# Patient Record
Sex: Female | Born: 1970 | Race: White | Hispanic: No | Marital: Single | Smoking: Current every day smoker
Health system: Southern US, Community
[De-identification: ages and names within clinical notes are randomized; demographics above are authoritative.]

## PROBLEM LIST (undated history)

## (undated) ENCOUNTER — Emergency Department (HOSPITAL_COMMUNITY): Payer: Medicaid Other

## (undated) ENCOUNTER — Emergency Department (HOSPITAL_COMMUNITY): Admission: EM | Payer: Medicaid Other | Source: Home / Self Care

## (undated) DIAGNOSIS — Z9151 Personal history of suicidal behavior: Secondary | ICD-10-CM

## (undated) DIAGNOSIS — E7800 Pure hypercholesterolemia, unspecified: Secondary | ICD-10-CM

## (undated) DIAGNOSIS — F329 Major depressive disorder, single episode, unspecified: Secondary | ICD-10-CM

## (undated) DIAGNOSIS — Z765 Malingerer [conscious simulation]: Secondary | ICD-10-CM

## (undated) DIAGNOSIS — F1910 Other psychoactive substance abuse, uncomplicated: Secondary | ICD-10-CM

## (undated) DIAGNOSIS — K769 Liver disease, unspecified: Secondary | ICD-10-CM

## (undated) DIAGNOSIS — Z915 Personal history of self-harm: Secondary | ICD-10-CM

## (undated) DIAGNOSIS — E119 Type 2 diabetes mellitus without complications: Secondary | ICD-10-CM

## (undated) DIAGNOSIS — I10 Essential (primary) hypertension: Secondary | ICD-10-CM

## (undated) DIAGNOSIS — M549 Dorsalgia, unspecified: Secondary | ICD-10-CM

## (undated) DIAGNOSIS — F419 Anxiety disorder, unspecified: Secondary | ICD-10-CM

## (undated) DIAGNOSIS — F32A Depression, unspecified: Secondary | ICD-10-CM

## (undated) DIAGNOSIS — F1020 Alcohol dependence, uncomplicated: Secondary | ICD-10-CM

## (undated) DIAGNOSIS — F319 Bipolar disorder, unspecified: Secondary | ICD-10-CM

## (undated) HISTORY — PX: BACK SURGERY: SHX140

---

## 1997-07-25 ENCOUNTER — Emergency Department (HOSPITAL_COMMUNITY): Admission: EM | Admit: 1997-07-25 | Discharge: 1997-07-25 | Payer: Self-pay | Admitting: Family Medicine

## 1998-03-23 ENCOUNTER — Emergency Department (HOSPITAL_COMMUNITY): Admission: EM | Admit: 1998-03-23 | Discharge: 1998-03-23 | Payer: Self-pay | Admitting: Emergency Medicine

## 1998-10-13 ENCOUNTER — Emergency Department (HOSPITAL_COMMUNITY): Admission: EM | Admit: 1998-10-13 | Discharge: 1998-10-13 | Payer: Self-pay | Admitting: *Deleted

## 1999-01-17 ENCOUNTER — Emergency Department (HOSPITAL_COMMUNITY): Admission: EM | Admit: 1999-01-17 | Discharge: 1999-01-17 | Payer: Self-pay | Admitting: Emergency Medicine

## 1999-01-17 ENCOUNTER — Encounter: Payer: Self-pay | Admitting: Emergency Medicine

## 2000-07-17 ENCOUNTER — Emergency Department (HOSPITAL_COMMUNITY): Admission: EM | Admit: 2000-07-17 | Discharge: 2000-07-17 | Payer: Self-pay | Admitting: *Deleted

## 2000-07-18 ENCOUNTER — Emergency Department (HOSPITAL_COMMUNITY): Admission: EM | Admit: 2000-07-18 | Discharge: 2000-07-18 | Payer: Self-pay | Admitting: Emergency Medicine

## 2000-07-19 ENCOUNTER — Emergency Department (HOSPITAL_COMMUNITY): Admission: EM | Admit: 2000-07-19 | Discharge: 2000-07-19 | Payer: Self-pay | Admitting: Emergency Medicine

## 2000-10-17 ENCOUNTER — Emergency Department (HOSPITAL_COMMUNITY): Admission: EM | Admit: 2000-10-17 | Discharge: 2000-10-17 | Payer: Self-pay | Admitting: *Deleted

## 2000-12-26 ENCOUNTER — Encounter: Payer: Self-pay | Admitting: *Deleted

## 2000-12-26 ENCOUNTER — Emergency Department (HOSPITAL_COMMUNITY): Admission: EM | Admit: 2000-12-26 | Discharge: 2000-12-26 | Payer: Self-pay | Admitting: *Deleted

## 2000-12-27 ENCOUNTER — Encounter: Payer: Self-pay | Admitting: Emergency Medicine

## 2000-12-27 ENCOUNTER — Emergency Department (HOSPITAL_COMMUNITY): Admission: EM | Admit: 2000-12-27 | Discharge: 2000-12-27 | Payer: Self-pay | Admitting: Emergency Medicine

## 2001-01-03 ENCOUNTER — Emergency Department (HOSPITAL_COMMUNITY): Admission: EM | Admit: 2001-01-03 | Discharge: 2001-01-03 | Payer: Self-pay | Admitting: *Deleted

## 2001-01-29 ENCOUNTER — Emergency Department (HOSPITAL_COMMUNITY): Admission: EM | Admit: 2001-01-29 | Discharge: 2001-01-29 | Payer: Self-pay | Admitting: Emergency Medicine

## 2001-03-05 ENCOUNTER — Emergency Department (HOSPITAL_COMMUNITY): Admission: EM | Admit: 2001-03-05 | Discharge: 2001-03-05 | Payer: Self-pay | Admitting: *Deleted

## 2001-04-07 ENCOUNTER — Emergency Department (HOSPITAL_COMMUNITY): Admission: EM | Admit: 2001-04-07 | Discharge: 2001-04-07 | Payer: Self-pay | Admitting: Emergency Medicine

## 2001-04-07 ENCOUNTER — Emergency Department (HOSPITAL_COMMUNITY): Admission: EM | Admit: 2001-04-07 | Discharge: 2001-04-07 | Payer: Self-pay | Admitting: *Deleted

## 2001-04-09 ENCOUNTER — Emergency Department (HOSPITAL_COMMUNITY): Admission: EM | Admit: 2001-04-09 | Discharge: 2001-04-09 | Payer: Self-pay | Admitting: Emergency Medicine

## 2001-04-10 ENCOUNTER — Emergency Department (HOSPITAL_COMMUNITY): Admission: EM | Admit: 2001-04-10 | Discharge: 2001-04-10 | Payer: Self-pay | Admitting: Emergency Medicine

## 2001-04-11 ENCOUNTER — Encounter (HOSPITAL_COMMUNITY): Admission: RE | Admit: 2001-04-11 | Discharge: 2001-05-11 | Payer: Self-pay | Admitting: Emergency Medicine

## 2001-04-16 ENCOUNTER — Emergency Department (HOSPITAL_COMMUNITY): Admission: EM | Admit: 2001-04-16 | Discharge: 2001-04-16 | Payer: Self-pay | Admitting: *Deleted

## 2001-06-08 ENCOUNTER — Emergency Department (HOSPITAL_COMMUNITY): Admission: EM | Admit: 2001-06-08 | Discharge: 2001-06-08 | Payer: Self-pay | Admitting: Internal Medicine

## 2001-06-26 ENCOUNTER — Emergency Department (HOSPITAL_COMMUNITY): Admission: EM | Admit: 2001-06-26 | Discharge: 2001-06-26 | Payer: Self-pay | Admitting: *Deleted

## 2001-06-26 ENCOUNTER — Encounter: Payer: Self-pay | Admitting: *Deleted

## 2001-06-27 ENCOUNTER — Emergency Department (HOSPITAL_COMMUNITY): Admission: EM | Admit: 2001-06-27 | Discharge: 2001-06-27 | Payer: Self-pay | Admitting: *Deleted

## 2001-06-28 ENCOUNTER — Encounter: Payer: Self-pay | Admitting: Emergency Medicine

## 2001-06-28 ENCOUNTER — Emergency Department (HOSPITAL_COMMUNITY): Admission: EM | Admit: 2001-06-28 | Discharge: 2001-06-28 | Payer: Self-pay | Admitting: Emergency Medicine

## 2001-09-15 ENCOUNTER — Emergency Department (HOSPITAL_COMMUNITY): Admission: EM | Admit: 2001-09-15 | Discharge: 2001-09-15 | Payer: Self-pay | Admitting: Emergency Medicine

## 2001-10-07 ENCOUNTER — Emergency Department (HOSPITAL_COMMUNITY): Admission: EM | Admit: 2001-10-07 | Discharge: 2001-10-07 | Payer: Self-pay | Admitting: Emergency Medicine

## 2001-12-07 ENCOUNTER — Emergency Department (HOSPITAL_COMMUNITY): Admission: EM | Admit: 2001-12-07 | Discharge: 2001-12-07 | Payer: Self-pay | Admitting: Emergency Medicine

## 2002-04-24 ENCOUNTER — Emergency Department (HOSPITAL_COMMUNITY): Admission: EM | Admit: 2002-04-24 | Discharge: 2002-04-24 | Payer: Self-pay | Admitting: Emergency Medicine

## 2002-07-29 ENCOUNTER — Emergency Department (HOSPITAL_COMMUNITY): Admission: EM | Admit: 2002-07-29 | Discharge: 2002-07-30 | Payer: Self-pay | Admitting: Emergency Medicine

## 2002-10-22 ENCOUNTER — Emergency Department (HOSPITAL_COMMUNITY): Admission: EM | Admit: 2002-10-22 | Discharge: 2002-10-22 | Payer: Self-pay | Admitting: Emergency Medicine

## 2002-12-28 ENCOUNTER — Emergency Department (HOSPITAL_COMMUNITY): Admission: EM | Admit: 2002-12-28 | Discharge: 2002-12-28 | Payer: Self-pay | Admitting: Emergency Medicine

## 2003-02-06 ENCOUNTER — Emergency Department (HOSPITAL_COMMUNITY): Admission: EM | Admit: 2003-02-06 | Discharge: 2003-02-06 | Payer: Self-pay | Admitting: Emergency Medicine

## 2003-06-20 ENCOUNTER — Emergency Department (HOSPITAL_COMMUNITY): Admission: EM | Admit: 2003-06-20 | Discharge: 2003-06-20 | Payer: Self-pay | Admitting: Emergency Medicine

## 2003-06-22 ENCOUNTER — Emergency Department (HOSPITAL_COMMUNITY): Admission: EM | Admit: 2003-06-22 | Discharge: 2003-06-22 | Payer: Self-pay | Admitting: Emergency Medicine

## 2003-07-04 ENCOUNTER — Emergency Department (HOSPITAL_COMMUNITY): Admission: EM | Admit: 2003-07-04 | Discharge: 2003-07-04 | Payer: Self-pay | Admitting: Emergency Medicine

## 2003-07-05 ENCOUNTER — Emergency Department (HOSPITAL_COMMUNITY): Admission: EM | Admit: 2003-07-05 | Discharge: 2003-07-05 | Payer: Self-pay | Admitting: Emergency Medicine

## 2003-07-06 ENCOUNTER — Emergency Department (HOSPITAL_COMMUNITY): Admission: EM | Admit: 2003-07-06 | Discharge: 2003-07-06 | Payer: Self-pay | Admitting: Emergency Medicine

## 2003-07-10 ENCOUNTER — Emergency Department (HOSPITAL_COMMUNITY): Admission: EM | Admit: 2003-07-10 | Discharge: 2003-07-10 | Payer: Self-pay | Admitting: Emergency Medicine

## 2003-08-22 ENCOUNTER — Emergency Department (HOSPITAL_COMMUNITY): Admission: EM | Admit: 2003-08-22 | Discharge: 2003-08-22 | Payer: Self-pay | Admitting: Emergency Medicine

## 2003-09-03 ENCOUNTER — Emergency Department (HOSPITAL_COMMUNITY): Admission: EM | Admit: 2003-09-03 | Discharge: 2003-09-03 | Payer: Self-pay | Admitting: Emergency Medicine

## 2003-09-06 ENCOUNTER — Emergency Department (HOSPITAL_COMMUNITY): Admission: EM | Admit: 2003-09-06 | Discharge: 2003-09-06 | Payer: Self-pay | Admitting: Emergency Medicine

## 2003-10-09 ENCOUNTER — Emergency Department (HOSPITAL_COMMUNITY): Admission: EM | Admit: 2003-10-09 | Discharge: 2003-10-09 | Payer: Self-pay | Admitting: Emergency Medicine

## 2003-11-07 ENCOUNTER — Emergency Department (HOSPITAL_COMMUNITY): Admission: EM | Admit: 2003-11-07 | Discharge: 2003-11-07 | Payer: Self-pay | Admitting: Emergency Medicine

## 2003-12-23 ENCOUNTER — Emergency Department (HOSPITAL_COMMUNITY): Admission: EM | Admit: 2003-12-23 | Discharge: 2003-12-23 | Payer: Self-pay | Admitting: Emergency Medicine

## 2004-01-07 ENCOUNTER — Emergency Department (HOSPITAL_COMMUNITY): Admission: EM | Admit: 2004-01-07 | Discharge: 2004-01-07 | Payer: Self-pay | Admitting: Emergency Medicine

## 2004-01-21 ENCOUNTER — Emergency Department (HOSPITAL_COMMUNITY): Admission: EM | Admit: 2004-01-21 | Discharge: 2004-01-21 | Payer: Self-pay | Admitting: Emergency Medicine

## 2004-02-18 ENCOUNTER — Emergency Department (HOSPITAL_COMMUNITY): Admission: EM | Admit: 2004-02-18 | Discharge: 2004-02-19 | Payer: Self-pay | Admitting: Emergency Medicine

## 2004-03-13 ENCOUNTER — Emergency Department (HOSPITAL_COMMUNITY): Admission: EM | Admit: 2004-03-13 | Discharge: 2004-03-13 | Payer: Self-pay | Admitting: Emergency Medicine

## 2004-04-01 ENCOUNTER — Emergency Department (HOSPITAL_COMMUNITY): Admission: EM | Admit: 2004-04-01 | Discharge: 2004-04-01 | Payer: Self-pay | Admitting: Emergency Medicine

## 2004-04-06 ENCOUNTER — Emergency Department (HOSPITAL_COMMUNITY): Admission: EM | Admit: 2004-04-06 | Discharge: 2004-04-06 | Payer: Self-pay | Admitting: Emergency Medicine

## 2004-06-05 ENCOUNTER — Emergency Department (HOSPITAL_COMMUNITY): Admission: EM | Admit: 2004-06-05 | Discharge: 2004-06-05 | Payer: Self-pay | Admitting: Emergency Medicine

## 2004-07-02 ENCOUNTER — Emergency Department (HOSPITAL_COMMUNITY): Admission: EM | Admit: 2004-07-02 | Discharge: 2004-07-02 | Payer: Self-pay | Admitting: Emergency Medicine

## 2004-10-26 ENCOUNTER — Emergency Department (HOSPITAL_COMMUNITY): Admission: EM | Admit: 2004-10-26 | Discharge: 2004-10-27 | Payer: Self-pay | Admitting: *Deleted

## 2004-10-28 ENCOUNTER — Emergency Department (HOSPITAL_COMMUNITY): Admission: EM | Admit: 2004-10-28 | Discharge: 2004-10-28 | Payer: Self-pay | Admitting: Emergency Medicine

## 2004-11-15 ENCOUNTER — Emergency Department (HOSPITAL_COMMUNITY): Admission: EM | Admit: 2004-11-15 | Discharge: 2004-11-15 | Payer: Self-pay | Admitting: Emergency Medicine

## 2005-01-20 ENCOUNTER — Emergency Department (HOSPITAL_COMMUNITY): Admission: EM | Admit: 2005-01-20 | Discharge: 2005-01-20 | Payer: Self-pay | Admitting: Emergency Medicine

## 2005-02-18 ENCOUNTER — Ambulatory Visit: Payer: Self-pay | Admitting: Internal Medicine

## 2005-03-26 ENCOUNTER — Emergency Department (HOSPITAL_COMMUNITY): Admission: EM | Admit: 2005-03-26 | Discharge: 2005-03-26 | Payer: Self-pay | Admitting: Emergency Medicine

## 2005-04-04 ENCOUNTER — Emergency Department (HOSPITAL_COMMUNITY): Admission: EM | Admit: 2005-04-04 | Discharge: 2005-04-04 | Payer: Self-pay | Admitting: Emergency Medicine

## 2005-12-14 ENCOUNTER — Emergency Department (HOSPITAL_COMMUNITY): Admission: EM | Admit: 2005-12-14 | Discharge: 2005-12-14 | Payer: Self-pay | Admitting: Emergency Medicine

## 2006-02-15 ENCOUNTER — Ambulatory Visit (HOSPITAL_COMMUNITY): Admission: RE | Admit: 2006-02-15 | Discharge: 2006-02-15 | Payer: Self-pay | Admitting: Family Medicine

## 2006-05-15 ENCOUNTER — Emergency Department (HOSPITAL_COMMUNITY): Admission: EM | Admit: 2006-05-15 | Discharge: 2006-05-15 | Payer: Self-pay | Admitting: Emergency Medicine

## 2006-07-29 ENCOUNTER — Emergency Department (HOSPITAL_COMMUNITY): Admission: EM | Admit: 2006-07-29 | Discharge: 2006-07-29 | Payer: Self-pay | Admitting: Emergency Medicine

## 2006-10-01 ENCOUNTER — Emergency Department (HOSPITAL_COMMUNITY): Admission: EM | Admit: 2006-10-01 | Discharge: 2006-10-01 | Payer: Self-pay | Admitting: Emergency Medicine

## 2006-12-07 ENCOUNTER — Emergency Department (HOSPITAL_COMMUNITY): Admission: EM | Admit: 2006-12-07 | Discharge: 2006-12-07 | Payer: Self-pay | Admitting: Emergency Medicine

## 2006-12-26 ENCOUNTER — Emergency Department (HOSPITAL_COMMUNITY): Admission: EM | Admit: 2006-12-26 | Discharge: 2006-12-26 | Payer: Self-pay | Admitting: Emergency Medicine

## 2007-02-22 ENCOUNTER — Emergency Department (HOSPITAL_COMMUNITY): Admission: EM | Admit: 2007-02-22 | Discharge: 2007-02-22 | Payer: Self-pay | Admitting: Emergency Medicine

## 2007-04-10 ENCOUNTER — Emergency Department (HOSPITAL_COMMUNITY): Admission: EM | Admit: 2007-04-10 | Discharge: 2007-04-11 | Payer: Self-pay | Admitting: Emergency Medicine

## 2007-08-21 ENCOUNTER — Emergency Department (HOSPITAL_COMMUNITY): Admission: EM | Admit: 2007-08-21 | Discharge: 2007-08-21 | Payer: Self-pay | Admitting: Emergency Medicine

## 2007-08-31 ENCOUNTER — Emergency Department (HOSPITAL_COMMUNITY): Admission: EM | Admit: 2007-08-31 | Discharge: 2007-08-31 | Payer: Self-pay | Admitting: Emergency Medicine

## 2007-11-11 ENCOUNTER — Emergency Department (HOSPITAL_COMMUNITY): Admission: EM | Admit: 2007-11-11 | Discharge: 2007-11-11 | Payer: Self-pay | Admitting: Emergency Medicine

## 2007-11-23 ENCOUNTER — Emergency Department (HOSPITAL_COMMUNITY): Admission: EM | Admit: 2007-11-23 | Discharge: 2007-11-23 | Payer: Self-pay | Admitting: Emergency Medicine

## 2007-11-29 ENCOUNTER — Other Ambulatory Visit: Admission: RE | Admit: 2007-11-29 | Discharge: 2007-11-29 | Payer: Self-pay | Admitting: Unknown Physician Specialty

## 2007-11-29 ENCOUNTER — Encounter (INDEPENDENT_AMBULATORY_CARE_PROVIDER_SITE_OTHER): Payer: Self-pay | Admitting: Unknown Physician Specialty

## 2007-12-14 ENCOUNTER — Emergency Department (HOSPITAL_COMMUNITY): Admission: EM | Admit: 2007-12-14 | Discharge: 2007-12-14 | Payer: Self-pay | Admitting: Emergency Medicine

## 2007-12-19 ENCOUNTER — Emergency Department (HOSPITAL_COMMUNITY): Admission: EM | Admit: 2007-12-19 | Discharge: 2007-12-19 | Payer: Self-pay | Admitting: Emergency Medicine

## 2008-02-14 ENCOUNTER — Emergency Department (HOSPITAL_COMMUNITY): Admission: EM | Admit: 2008-02-14 | Discharge: 2008-02-14 | Payer: Self-pay | Admitting: Emergency Medicine

## 2008-02-26 ENCOUNTER — Emergency Department (HOSPITAL_COMMUNITY): Admission: EM | Admit: 2008-02-26 | Discharge: 2008-02-26 | Payer: Self-pay | Admitting: Emergency Medicine

## 2008-04-23 ENCOUNTER — Emergency Department (HOSPITAL_COMMUNITY): Admission: EM | Admit: 2008-04-23 | Discharge: 2008-04-23 | Payer: Self-pay | Admitting: Emergency Medicine

## 2009-05-08 ENCOUNTER — Emergency Department (HOSPITAL_COMMUNITY): Admission: EM | Admit: 2009-05-08 | Discharge: 2009-05-08 | Payer: Self-pay | Admitting: Emergency Medicine

## 2009-05-22 ENCOUNTER — Emergency Department (HOSPITAL_COMMUNITY): Admission: EM | Admit: 2009-05-22 | Discharge: 2009-05-23 | Payer: Self-pay | Admitting: Emergency Medicine

## 2009-07-30 ENCOUNTER — Emergency Department (HOSPITAL_COMMUNITY): Admission: EM | Admit: 2009-07-30 | Discharge: 2009-07-31 | Payer: Self-pay | Admitting: Emergency Medicine

## 2010-03-26 ENCOUNTER — Emergency Department (HOSPITAL_COMMUNITY)
Admission: EM | Admit: 2010-03-26 | Discharge: 2010-03-26 | Disposition: A | Discharge status: Home / Self Care | Payer: Self-pay | Attending: Emergency Medicine | Admitting: Emergency Medicine

## 2010-03-26 DIAGNOSIS — K089 Disorder of teeth and supporting structures, unspecified: Secondary | ICD-10-CM | POA: Insufficient documentation

## 2010-03-29 LAB — COMPREHENSIVE METABOLIC PANEL
ALT: 27 U/L (ref 0–35)
AST: 26 U/L (ref 0–37)
BUN: 6 mg/dL (ref 6–23)
Creatinine, Ser: 0.66 mg/dL (ref 0.4–1.2)
GFR calc Af Amer: >60 mL/min (ref >60)
Glucose, Bld: 107 mg/dL — ABNORMAL HIGH (ref 70–99)
Potassium: 3.8 mEq/L (ref 3.5–5.1)
Total Bilirubin: 0.3 mg/dL (ref 0.3–1.2)
Total Protein: 6.8 g/dL (ref 6.0–8.3)

## 2010-03-29 LAB — URINE MICROSCOPIC-ADD ON

## 2010-03-29 LAB — URINALYSIS, ROUTINE W REFLEX MICROSCOPIC
Glucose, UA: NEGATIVE mg/dL
Nitrite: NEGATIVE
Specific Gravity, Urine: 1.02 (ref 1.005–1.030)

## 2010-03-29 LAB — CBC
MCH: 30.8 pg (ref 26.0–34.0)
Platelets: 260 10*3/uL (ref 150–400)

## 2010-03-29 LAB — DIFFERENTIAL
Basophils Relative: 1 % (ref 0–1)
Eosinophils Absolute: 0.1 10*3/uL (ref 0.0–0.7)
Eosinophils Relative: 1 % (ref 0–5)
Monocytes Absolute: 0.4 10*3/uL (ref 0.1–1.0)
Monocytes Relative: 6 % (ref 3–12)
Neutrophils Relative %: 49 % (ref 43–77)

## 2010-03-29 LAB — GLUCOSE, CAPILLARY

## 2010-03-29 LAB — RAPID URINE DRUG SCREEN, HOSP PERFORMED
Benzodiazepines: POSITIVE — AB
Cocaine: POSITIVE — AB
Tetrahydrocannabinol: POSITIVE — AB

## 2010-03-29 LAB — ACETAMINOPHEN LEVEL: Acetaminophen (Tylenol), Serum: 146.9 ug/mL — ABNORMAL HIGH (ref 10–30)

## 2010-03-29 LAB — PREGNANCY, URINE: Preg Test, Ur: NEGATIVE

## 2010-03-29 LAB — ETHANOL: Alcohol, Ethyl (B): 169 mg/dL — ABNORMAL HIGH (ref 0–10)

## 2010-04-01 LAB — URINALYSIS, ROUTINE W REFLEX MICROSCOPIC
Glucose, UA: NEGATIVE mg/dL
Nitrite: NEGATIVE
Protein, ur: NEGATIVE mg/dL

## 2010-04-23 LAB — BASIC METABOLIC PANEL
BUN: 7 mg/dL (ref 6–23)
Calcium: 9.3 mg/dL (ref 8.4–10.5)
Creatinine, Ser: 0.69 mg/dL (ref 0.4–1.2)
GFR calc non Af Amer: >60 mL/min (ref >60)
Potassium: 4.4 mEq/L (ref 3.5–5.1)

## 2010-04-23 LAB — URINALYSIS, ROUTINE W REFLEX MICROSCOPIC
Ketones, ur: NEGATIVE mg/dL
Nitrite: NEGATIVE
Protein, ur: NEGATIVE mg/dL
Urobilinogen, UA: 0.2 mg/dL (ref 0.0–1.0)
pH: 6 (ref 5.0–8.0)

## 2010-04-23 LAB — DIFFERENTIAL
Basophils Absolute: 0 10*3/uL (ref 0.0–0.1)
Lymphocytes Relative: 23 % (ref 12–46)
Lymphs Abs: 2.1 10*3/uL (ref 0.7–4.0)
Neutro Abs: 6.4 10*3/uL (ref 1.7–7.7)
Neutrophils Relative %: 69 % (ref 43–77)

## 2010-04-23 LAB — CBC
Platelets: 267 10*3/uL (ref 150–400)
RDW: 13.6 % (ref 11.5–15.5)
WBC: 9.3 10*3/uL (ref 4.0–10.5)

## 2010-04-23 LAB — PREGNANCY, URINE: Preg Test, Ur: NEGATIVE

## 2010-06-01 ENCOUNTER — Emergency Department (HOSPITAL_COMMUNITY)
Admission: EM | Admit: 2010-06-01 | Discharge: 2010-06-01 | Disposition: A | Discharge status: Home / Self Care | Payer: Self-pay | Attending: Emergency Medicine | Admitting: Emergency Medicine

## 2010-06-01 DIAGNOSIS — M545 Low back pain, unspecified: Secondary | ICD-10-CM | POA: Insufficient documentation

## 2010-06-01 DIAGNOSIS — J449 Chronic obstructive pulmonary disease, unspecified: Secondary | ICD-10-CM | POA: Insufficient documentation

## 2010-06-01 DIAGNOSIS — J4489 Other specified chronic obstructive pulmonary disease: Secondary | ICD-10-CM | POA: Insufficient documentation

## 2010-06-01 DIAGNOSIS — I10 Essential (primary) hypertension: Secondary | ICD-10-CM | POA: Insufficient documentation

## 2010-06-01 DIAGNOSIS — K089 Disorder of teeth and supporting structures, unspecified: Secondary | ICD-10-CM | POA: Insufficient documentation

## 2010-06-01 LAB — POCT PREGNANCY, URINE: Preg Test, Ur: NEGATIVE

## 2010-06-01 LAB — URINALYSIS, ROUTINE W REFLEX MICROSCOPIC
Bilirubin Urine: NEGATIVE
Nitrite: NEGATIVE
Protein, ur: NEGATIVE mg/dL
Specific Gravity, Urine: 1.025 (ref 1.005–1.030)
Urobilinogen, UA: 0.2 mg/dL (ref 0.0–1.0)

## 2010-06-05 ENCOUNTER — Emergency Department (HOSPITAL_COMMUNITY)
Admission: EM | Admit: 2010-06-05 | Discharge: 2010-06-05 | Discharge status: Against Medical Advice | Payer: Self-pay | Attending: Emergency Medicine | Admitting: Emergency Medicine

## 2010-06-05 ENCOUNTER — Emergency Department (HOSPITAL_COMMUNITY)
Admission: EM | Admit: 2010-06-05 | Discharge: 2010-06-05 | Disposition: A | Discharge status: Home / Self Care | Payer: Self-pay | Attending: Emergency Medicine | Admitting: Emergency Medicine

## 2010-06-05 DIAGNOSIS — I10 Essential (primary) hypertension: Secondary | ICD-10-CM | POA: Insufficient documentation

## 2010-06-05 DIAGNOSIS — F411 Generalized anxiety disorder: Secondary | ICD-10-CM | POA: Insufficient documentation

## 2010-06-05 DIAGNOSIS — F319 Bipolar disorder, unspecified: Secondary | ICD-10-CM | POA: Insufficient documentation

## 2010-06-05 DIAGNOSIS — J449 Chronic obstructive pulmonary disease, unspecified: Secondary | ICD-10-CM | POA: Insufficient documentation

## 2010-06-05 DIAGNOSIS — R52 Pain, unspecified: Secondary | ICD-10-CM | POA: Insufficient documentation

## 2010-06-05 DIAGNOSIS — R51 Headache: Secondary | ICD-10-CM | POA: Insufficient documentation

## 2010-06-05 DIAGNOSIS — S0003XA Contusion of scalp, initial encounter: Secondary | ICD-10-CM | POA: Insufficient documentation

## 2010-06-05 DIAGNOSIS — F17200 Nicotine dependence, unspecified, uncomplicated: Secondary | ICD-10-CM | POA: Insufficient documentation

## 2010-06-05 DIAGNOSIS — J4489 Other specified chronic obstructive pulmonary disease: Secondary | ICD-10-CM | POA: Insufficient documentation

## 2010-06-16 ENCOUNTER — Emergency Department (HOSPITAL_COMMUNITY)
Admission: EM | Admit: 2010-06-16 | Discharge: 2010-06-16 | Disposition: A | Discharge status: Home / Self Care | Payer: Self-pay | Attending: Emergency Medicine | Admitting: Emergency Medicine

## 2010-06-16 DIAGNOSIS — F17200 Nicotine dependence, unspecified, uncomplicated: Secondary | ICD-10-CM | POA: Insufficient documentation

## 2010-06-16 DIAGNOSIS — R5381 Other malaise: Secondary | ICD-10-CM | POA: Insufficient documentation

## 2010-06-16 DIAGNOSIS — R5383 Other fatigue: Secondary | ICD-10-CM | POA: Insufficient documentation

## 2010-06-27 ENCOUNTER — Emergency Department (HOSPITAL_COMMUNITY)
Admission: EM | Admit: 2010-06-27 | Discharge: 2010-06-27 | Disposition: A | Discharge status: Home / Self Care | Payer: Self-pay | Attending: Emergency Medicine | Admitting: Emergency Medicine

## 2010-06-27 DIAGNOSIS — F17200 Nicotine dependence, unspecified, uncomplicated: Secondary | ICD-10-CM | POA: Insufficient documentation

## 2010-06-27 DIAGNOSIS — J4489 Other specified chronic obstructive pulmonary disease: Secondary | ICD-10-CM | POA: Insufficient documentation

## 2010-06-27 DIAGNOSIS — J449 Chronic obstructive pulmonary disease, unspecified: Secondary | ICD-10-CM | POA: Insufficient documentation

## 2010-06-27 DIAGNOSIS — R062 Wheezing: Secondary | ICD-10-CM | POA: Insufficient documentation

## 2010-06-27 DIAGNOSIS — Z8541 Personal history of malignant neoplasm of cervix uteri: Secondary | ICD-10-CM | POA: Insufficient documentation

## 2010-06-27 DIAGNOSIS — I10 Essential (primary) hypertension: Secondary | ICD-10-CM | POA: Insufficient documentation

## 2010-06-27 DIAGNOSIS — J029 Acute pharyngitis, unspecified: Secondary | ICD-10-CM | POA: Insufficient documentation

## 2010-06-27 LAB — RAPID STREP SCREEN (MED CTR MEBANE ONLY): Streptococcus, Group A Screen (Direct): NEGATIVE

## 2010-07-01 ENCOUNTER — Emergency Department (HOSPITAL_COMMUNITY)
Admission: EM | Admit: 2010-07-01 | Discharge: 2010-07-01 | Disposition: A | Discharge status: Home / Self Care | Payer: Self-pay | Attending: Emergency Medicine | Admitting: Emergency Medicine

## 2010-07-01 DIAGNOSIS — R45851 Suicidal ideations: Secondary | ICD-10-CM | POA: Insufficient documentation

## 2010-07-01 DIAGNOSIS — F17200 Nicotine dependence, unspecified, uncomplicated: Secondary | ICD-10-CM | POA: Insufficient documentation

## 2010-07-01 DIAGNOSIS — F1010 Alcohol abuse, uncomplicated: Secondary | ICD-10-CM | POA: Insufficient documentation

## 2010-07-01 DIAGNOSIS — F1910 Other psychoactive substance abuse, uncomplicated: Secondary | ICD-10-CM | POA: Insufficient documentation

## 2010-07-01 LAB — RAPID URINE DRUG SCREEN, HOSP PERFORMED
Amphetamines: NOT DETECTED
Amphetamines: NOT DETECTED
Barbiturates: NOT DETECTED
Benzodiazepines: POSITIVE — AB
Opiates: NOT DETECTED
Tetrahydrocannabinol: NOT DETECTED

## 2010-07-01 LAB — COMPREHENSIVE METABOLIC PANEL
ALT: 40 U/L — ABNORMAL HIGH (ref 0–35)
Alkaline Phosphatase: 112 U/L (ref 39–117)
CO2: 27 mEq/L (ref 19–32)
GFR calc Af Amer: >60 mL/min (ref >60)
GFR calc non Af Amer: >60 mL/min (ref >60)
Glucose, Bld: 144 mg/dL — ABNORMAL HIGH (ref 70–99)
Potassium: 3.7 mEq/L (ref 3.5–5.1)
Sodium: 137 mEq/L (ref 135–145)

## 2010-07-01 LAB — DIFFERENTIAL
Basophils Absolute: 0 10*3/uL (ref 0.0–0.1)
Basophils Relative: 0 % (ref 0–1)
Monocytes Relative: 7 % (ref 3–12)
Neutro Abs: 3.2 10*3/uL (ref 1.7–7.7)
Neutrophils Relative %: 43 % (ref 43–77)

## 2010-07-01 LAB — CBC
Hemoglobin: 12.4 g/dL (ref 12.0–15.0)
RBC: 4.1 MIL/uL (ref 3.87–5.11)

## 2010-09-20 ENCOUNTER — Emergency Department (HOSPITAL_COMMUNITY): Payer: Self-pay

## 2010-09-20 ENCOUNTER — Encounter: Payer: Self-pay | Admitting: *Deleted

## 2010-09-20 ENCOUNTER — Emergency Department (HOSPITAL_COMMUNITY)
Admission: EM | Admit: 2010-09-20 | Discharge: 2010-09-20 | Disposition: A | Discharge status: Home / Self Care | Payer: Self-pay | Attending: Emergency Medicine | Admitting: Emergency Medicine

## 2010-09-20 DIAGNOSIS — W108XXA Fall (on) (from) other stairs and steps, initial encounter: Secondary | ICD-10-CM | POA: Insufficient documentation

## 2010-09-20 DIAGNOSIS — Y92009 Unspecified place in unspecified non-institutional (private) residence as the place of occurrence of the external cause: Secondary | ICD-10-CM | POA: Insufficient documentation

## 2010-09-20 DIAGNOSIS — M79609 Pain in unspecified limb: Secondary | ICD-10-CM | POA: Insufficient documentation

## 2010-09-20 DIAGNOSIS — S82209A Unspecified fracture of shaft of unspecified tibia, initial encounter for closed fracture: Secondary | ICD-10-CM | POA: Insufficient documentation

## 2010-09-20 HISTORY — DX: Anxiety disorder, unspecified: F41.9

## 2010-09-20 HISTORY — DX: Depression, unspecified: F32.A

## 2010-09-20 HISTORY — DX: Bipolar disorder, unspecified: F31.9

## 2010-09-20 HISTORY — DX: Liver disease, unspecified: K76.9

## 2010-09-20 HISTORY — DX: Major depressive disorder, single episode, unspecified: F32.9

## 2010-09-20 MED ORDER — OXYCODONE-ACETAMINOPHEN 5-500 MG PO CAPS: 1.0000 | ORAL_CAPSULE | ORAL | Status: AC | PRN

## 2010-09-20 MED ORDER — OXYCODONE-ACETAMINOPHEN 5-325 MG PO TABS
1.0000 | ORAL_TABLET | Freq: Once | ORAL | Status: AC
  Administered 2010-09-20: 1 via ORAL
  Filled 2010-09-20: qty 1

## 2010-09-20 MED ORDER — HYDROMORPHONE HCL 2 MG/ML IJ SOLN
2.0000 mg | Freq: Once | INTRAMUSCULAR | Status: AC
  Administered 2010-09-20: 2 mg via INTRAMUSCULAR
  Filled 2010-09-20: qty 1

## 2010-09-20 NOTE — ED Notes (Signed)
Pt requesting pain medication at this time. EDP notified. 
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
## 2010-09-20 NOTE — ED Notes (Signed)
Pt requesting pain medication & want to know how long it was going to be before she was seen by EDP.

## 2010-09-20 NOTE — ED Notes (Signed)
Advised by edp that dr Hilda Lias is coming to see pt in the er.

## 2010-09-20 NOTE — ED Notes (Signed)
Pt advised of time to get xray results back. Pt requesting another warm blanket & the need to use the bath room. Ed tech provided warm blanket & assisted pt onto the bedpan.
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
## 2010-09-20 NOTE — ED Notes (Addendum)
Pt complains of right leg hurting. Bruise notes, pulses present. Pt has the smell of etoh, admitts to drinking a 6 pack tonight.

## 2010-09-29 ENCOUNTER — Other Ambulatory Visit (HOSPITAL_COMMUNITY): Payer: Self-pay | Admitting: Orthopaedic Surgery

## 2010-09-29 ENCOUNTER — Ambulatory Visit (HOSPITAL_COMMUNITY)
Admission: RE | Admit: 2010-09-29 | Discharge: 2010-09-29 | Disposition: A | Discharge status: Home / Self Care | Payer: Self-pay | Source: Ambulatory Visit | Attending: Orthopaedic Surgery | Admitting: Orthopaedic Surgery

## 2010-09-29 DIAGNOSIS — S82209A Unspecified fracture of shaft of unspecified tibia, initial encounter for closed fracture: Secondary | ICD-10-CM

## 2010-09-29 DIAGNOSIS — Z4789 Encounter for other orthopedic aftercare: Secondary | ICD-10-CM | POA: Insufficient documentation

## 2010-09-29 DIAGNOSIS — M79609 Pain in unspecified limb: Secondary | ICD-10-CM | POA: Insufficient documentation

## 2010-10-29 ENCOUNTER — Other Ambulatory Visit (HOSPITAL_COMMUNITY): Payer: Self-pay | Admitting: Orthopaedic Surgery

## 2010-10-29 ENCOUNTER — Ambulatory Visit (HOSPITAL_COMMUNITY)
Admission: RE | Admit: 2010-10-29 | Discharge: 2010-10-29 | Disposition: A | Discharge status: Home / Self Care | Payer: Self-pay | Source: Ambulatory Visit | Attending: Orthopaedic Surgery | Admitting: Orthopaedic Surgery

## 2010-10-29 DIAGNOSIS — T148XXA Other injury of unspecified body region, initial encounter: Secondary | ICD-10-CM

## 2010-10-29 DIAGNOSIS — Z4789 Encounter for other orthopedic aftercare: Secondary | ICD-10-CM | POA: Insufficient documentation

## 2010-11-27 ENCOUNTER — Ambulatory Visit (HOSPITAL_COMMUNITY)
Admission: RE | Admit: 2010-11-27 | Discharge: 2010-11-27 | Disposition: A | Discharge status: Home / Self Care | Payer: Self-pay | Source: Ambulatory Visit | Attending: Orthopaedic Surgery | Admitting: Orthopaedic Surgery

## 2010-11-27 ENCOUNTER — Other Ambulatory Visit (HOSPITAL_COMMUNITY): Payer: Self-pay | Admitting: Orthopaedic Surgery

## 2010-11-27 DIAGNOSIS — T148XXA Other injury of unspecified body region, initial encounter: Secondary | ICD-10-CM

## 2010-11-27 DIAGNOSIS — IMO0001 Reserved for inherently not codable concepts without codable children: Secondary | ICD-10-CM | POA: Insufficient documentation

## 2010-12-24 ENCOUNTER — Other Ambulatory Visit (HOSPITAL_COMMUNITY): Payer: Self-pay | Admitting: Orthopaedic Surgery

## 2010-12-24 ENCOUNTER — Ambulatory Visit (HOSPITAL_COMMUNITY)
Admission: RE | Admit: 2010-12-24 | Discharge: 2010-12-24 | Disposition: A | Discharge status: Home / Self Care | Payer: Self-pay | Source: Ambulatory Visit | Attending: Orthopaedic Surgery | Admitting: Orthopaedic Surgery

## 2010-12-24 DIAGNOSIS — S72309A Unspecified fracture of shaft of unspecified femur, initial encounter for closed fracture: Secondary | ICD-10-CM

## 2010-12-24 DIAGNOSIS — S7290XD Unspecified fracture of unspecified femur, subsequent encounter for closed fracture with routine healing: Secondary | ICD-10-CM | POA: Insufficient documentation

## 2011-01-15 ENCOUNTER — Ambulatory Visit (HOSPITAL_COMMUNITY)
Admission: RE | Admit: 2011-01-15 | Discharge: 2011-01-15 | Disposition: A | Discharge status: Home / Self Care | Payer: Self-pay | Source: Ambulatory Visit | Attending: Orthopaedic Surgery | Admitting: Orthopaedic Surgery

## 2011-01-15 ENCOUNTER — Other Ambulatory Visit (HOSPITAL_COMMUNITY): Payer: Self-pay | Admitting: Orthopaedic Surgery

## 2011-01-15 DIAGNOSIS — T148XXA Other injury of unspecified body region, initial encounter: Secondary | ICD-10-CM

## 2011-01-15 DIAGNOSIS — IMO0001 Reserved for inherently not codable concepts without codable children: Secondary | ICD-10-CM | POA: Insufficient documentation

## 2011-02-09 ENCOUNTER — Ambulatory Visit (HOSPITAL_COMMUNITY)
Admission: RE | Admit: 2011-02-09 | Discharge: 2011-02-09 | Disposition: A | Discharge status: Home / Self Care | Payer: Self-pay | Source: Ambulatory Visit | Attending: Orthopaedic Surgery | Admitting: Orthopaedic Surgery

## 2011-02-09 ENCOUNTER — Other Ambulatory Visit (HOSPITAL_COMMUNITY): Payer: Self-pay | Admitting: Orthopaedic Surgery

## 2011-02-09 DIAGNOSIS — T148XXA Other injury of unspecified body region, initial encounter: Secondary | ICD-10-CM

## 2011-02-09 DIAGNOSIS — IMO0001 Reserved for inherently not codable concepts without codable children: Secondary | ICD-10-CM | POA: Insufficient documentation

## 2011-02-21 ENCOUNTER — Ambulatory Visit (HOSPITAL_COMMUNITY)
Admission: RE | Admit: 2011-02-21 | Discharge: 2011-02-21 | Disposition: A | Discharge status: Home / Self Care | Payer: Self-pay | Source: Ambulatory Visit | Attending: Orthopaedic Surgery | Admitting: Orthopaedic Surgery

## 2011-02-21 ENCOUNTER — Other Ambulatory Visit: Payer: Self-pay | Admitting: Orthopaedic Surgery

## 2011-02-21 DIAGNOSIS — T148XXA Other injury of unspecified body region, initial encounter: Secondary | ICD-10-CM

## 2011-02-21 DIAGNOSIS — S7290XD Unspecified fracture of unspecified femur, subsequent encounter for closed fracture with routine healing: Secondary | ICD-10-CM | POA: Insufficient documentation

## 2011-04-13 ENCOUNTER — Encounter (HOSPITAL_COMMUNITY): Payer: Self-pay | Admitting: *Deleted

## 2011-04-13 DIAGNOSIS — F319 Bipolar disorder, unspecified: Secondary | ICD-10-CM | POA: Insufficient documentation

## 2011-04-13 DIAGNOSIS — F411 Generalized anxiety disorder: Secondary | ICD-10-CM | POA: Insufficient documentation

## 2011-04-13 DIAGNOSIS — K029 Dental caries, unspecified: Secondary | ICD-10-CM | POA: Insufficient documentation

## 2011-04-13 DIAGNOSIS — F17200 Nicotine dependence, unspecified, uncomplicated: Secondary | ICD-10-CM | POA: Insufficient documentation

## 2011-04-13 NOTE — ED Notes (Signed)
C/o right upper molar pain with some jaw swelling x 2 days.

## 2011-04-14 ENCOUNTER — Emergency Department (HOSPITAL_COMMUNITY)
Admission: EM | Admit: 2011-04-14 | Discharge: 2011-04-14 | Disposition: A | Discharge status: Home / Self Care | Payer: Self-pay | Attending: Emergency Medicine | Admitting: Emergency Medicine

## 2011-04-14 DIAGNOSIS — K029 Dental caries, unspecified: Secondary | ICD-10-CM

## 2011-04-14 MED ORDER — AMOXICILLIN 500 MG PO CAPS: 500.0000 mg | ORAL_CAPSULE | Freq: Three times a day (TID) | ORAL | Status: DC

## 2011-04-14 MED ORDER — IBUPROFEN 800 MG PO TABS
800.0000 mg | ORAL_TABLET | Freq: Once | ORAL | Status: AC
  Administered 2011-04-14: 800 mg via ORAL
  Filled 2011-04-14: qty 1

## 2011-04-14 MED ORDER — ACETAMINOPHEN 500 MG PO TABS
1000.0000 mg | ORAL_TABLET | Freq: Once | ORAL | Status: AC
  Administered 2011-04-14: 1000 mg via ORAL
  Filled 2011-04-14: qty 2

## 2011-04-14 MED ORDER — OXYCODONE-ACETAMINOPHEN 5-325 MG PO TABS: 2.0000 | ORAL_TABLET | ORAL | Status: AC | PRN

## 2011-04-14 MED ORDER — AMOXICILLIN 250 MG PO CAPS
1000.0000 mg | ORAL_CAPSULE | Freq: Once | ORAL | Status: AC
  Administered 2011-04-14: 1000 mg via ORAL
  Filled 2011-04-14: qty 4

## 2011-04-14 NOTE — Discharge Instructions (Signed)
Dental Caries Dental caries (cavities) are areas of tooth decay. Cavities are usually caused by a combination of poor dental care; sugar; tobacco, alcohol, and drug abuse; decreased saliva production; and receding gums. If cavities are not treated by a dentist, they grow in size. This can cause toothaches, infection, and loss of the tooth. Cavities of the outer tooth enamel do not cause symptoms. Dental pain from cold drinks may be the first sign the enamel has broken down and decay has spread toward the root of the tooth. This can cause the tooth to die or become infected. If a cavity is treated before it causes toothache, the tooth can usually be saved. Cavities can be prevented by good oral hygiene. Brushing your teeth in the morning and before bed, and using dental floss once daily helps remove plaque and reduce bacteria. Candy, soft drinks, and other sources of sugar promote tooth decay by promoting the growth of bacteria in the mouth. Proper diet, fluoride, dental cleaning, and fillings are important in preventing the loss of teeth from decay. Antibiotics, root canal treatment, or dental extraction may be needed if the decay is severe. Take any pain medication or antibiotics as directed by your caregiver. It is important that you follow up with a dentist for definitive care. SEEK MEDICAL CARE IF:   You or your child has an oral temperature above 102 F (38.9 C).   There is difficulty opening the mouth.   There is difficulty swallowing or handling secretions.   There is difficulty breathing.   There is chest pain.   There are worsening or concerning symptoms.  Document Released: 02/06/2004 Document Revised: 12/18/2010 Document Reviewed: 04/23/2009 [facility] Patient Information 2012 [place], [place].  Antibiotics and pain medicine. Can also take take ibuprofen 4 tablets 3 times a day.  Need to followup with a dentist.

## 2011-04-14 NOTE — ED Notes (Signed)
Sleeping in waiting room; in no distress.

## 2011-04-14 NOTE — ED Provider Notes (Signed)
History     CSN: [id number]  Arrival date & time 04/13/11  2303   First MD Initiated Contact with Patient 04/14/11 [phone]      Chief Complaint  Patient presents with  . Dental Pain    (Consider location/radiation/quality/duration/timing/severity/associated sxs/prior treatment) HPI...  upper tooth pain for several days. No fever sweats chills or stiff neck. Nothing makes it better or worse.  And is moderate. No radiation  Past Medical History  Diagnosis Date  . Anxiety   . Bipolar 1 disorder   . Depression   . Liver disease   . Diabetes mellitus     History reviewed. No pertinent past surgical history.  No family history on file.  History  Substance Use Topics  . Smoking status: Current Everyday Smoker -- 1.0 packs/day for 15 years    Types: Cigarettes  . Smokeless tobacco: Not on file  . Alcohol Use: 3.6 oz/week    6 Cans of beer per week    OB History    Grav Para Term Preterm Abortions TAB SAB Ect Mult Living                  Review of Systems  All other systems reviewed and are negative.    Allergies  Review of patient's allergies indicates no known allergies.  Home Medications   Current Outpatient Rx  Name Route Sig Dispense Refill  . METFORMIN HCL 500 MG PO TABS Oral Take 500 mg by mouth daily.    . AMOXICILLIN 500 MG PO CAPS Oral Take 1 capsule (500 mg total) by mouth 3 (three) times daily. 30 capsule 0  . CITALOPRAM HYDROBROMIDE 20 MG PO TABS Oral Take 20 mg by mouth daily.      Marland Kitchen DIAZEPAM 5 MG PO TABS Oral Take 5 mg by mouth 3 (three) times daily.      . OXYCODONE-ACETAMINOPHEN 5-325 MG PO TABS Oral Take 2 tablets by mouth every 4 (four) hours as needed for pain. 20 tablet 0    BP 123/61  Pulse 74  Temp(Src) 98.5 F (36.9 C) (Oral)  Resp 16  Ht 5' 3.5" (1.613 m)  Wt 165 lb (74.844 kg)  BMI 28.77 kg/m2  SpO2 95%  LMP 04/07/2011  Physical Exam  Constitutional: She is oriented to person, place, and time. She appears well-developed and  well-nourished.  HENT:       Obvious caries right upper premolar  Neck:       No meningeal signs  Musculoskeletal: Normal range of motion.  Neurological: She is alert and oriented to person, place, and time.  Skin: Skin is warm and dry.  Psychiatric: She has a normal mood and affect.    ED Course  Procedures (including critical care time)  Labs Reviewed - No data to display No results found.   1. Tooth caries       MDM  Rx amoxicillin and pain medication. Follow up with Dentist        Donnetta Hutching, MD 04/14/11 [phone]

## 2011-04-21 ENCOUNTER — Encounter (HOSPITAL_COMMUNITY): Payer: Self-pay

## 2011-04-21 ENCOUNTER — Emergency Department (HOSPITAL_COMMUNITY)
Admission: EM | Admit: 2011-04-21 | Discharge: 2011-04-21 | Disposition: A | Discharge status: Home / Self Care | Payer: Self-pay | Attending: Emergency Medicine | Admitting: Emergency Medicine

## 2011-04-21 DIAGNOSIS — K0889 Other specified disorders of teeth and supporting structures: Secondary | ICD-10-CM

## 2011-04-21 DIAGNOSIS — F411 Generalized anxiety disorder: Secondary | ICD-10-CM | POA: Insufficient documentation

## 2011-04-21 DIAGNOSIS — K029 Dental caries, unspecified: Secondary | ICD-10-CM | POA: Insufficient documentation

## 2011-04-21 DIAGNOSIS — F17200 Nicotine dependence, unspecified, uncomplicated: Secondary | ICD-10-CM | POA: Insufficient documentation

## 2011-04-21 DIAGNOSIS — Z886 Allergy status to analgesic agent status: Secondary | ICD-10-CM | POA: Insufficient documentation

## 2011-04-21 DIAGNOSIS — E119 Type 2 diabetes mellitus without complications: Secondary | ICD-10-CM | POA: Insufficient documentation

## 2011-04-21 DIAGNOSIS — F319 Bipolar disorder, unspecified: Secondary | ICD-10-CM | POA: Insufficient documentation

## 2011-04-21 MED ORDER — OXYCODONE-ACETAMINOPHEN 5-325 MG PO TABS: 1.0000 | ORAL_TABLET | Freq: Four times a day (QID) | ORAL | Status: AC | PRN

## 2011-04-21 NOTE — ED Provider Notes (Signed)
History     CSN: [id number]  Arrival date & time 04/21/11  1010   First MD Initiated Contact with Patient 04/21/11 1205      Chief Complaint  Patient presents with  . Dental Pain    (Consider location/radiation/quality/duration/timing/severity/associated sxs/prior treatment) Patient is a 41 y.o. female presenting with tooth pain. The history is provided by the patient.  Dental PainThe primary symptoms include mouth pain and headaches. Primary symptoms do not include shortness of breath or cough. The symptoms began more than 1 week ago. The symptoms are worsening. The symptoms occur frequently.  The headache is not associated with photophobia.  Additional symptoms include: gum swelling, gum tenderness and jaw pain. Additional symptoms do not include: nosebleeds. Medical issues include: smoking.    Past Medical History  Diagnosis Date  . Anxiety   . Bipolar 1 disorder   . Depression   . Liver disease   . Diabetes mellitus     History reviewed. No pertinent past surgical history.  No family history on file.  History  Substance Use Topics  . Smoking status: Current Everyday Smoker -- 1.0 packs/day for 15 years    Types: Cigarettes  . Smokeless tobacco: Not on file  . Alcohol Use: 3.6 oz/week    6 Cans of beer per week     occasionally    OB History    Grav Para Term Preterm Abortions TAB SAB Ect Mult Living                  Review of Systems  Constitutional: Negative for activity change.       All ROS Neg except as noted in HPI  HENT: Positive for dental problem. Negative for nosebleeds and neck pain.   Eyes: Negative for photophobia and discharge.  Respiratory: Negative for cough, shortness of breath and wheezing.   Cardiovascular: Negative for chest pain and palpitations.  Gastrointestinal: Negative for abdominal pain and blood in stool.  Genitourinary: Negative for dysuria, frequency and hematuria.  Musculoskeletal: Negative for back pain and arthralgias.    Skin: Negative.   Neurological: Positive for headaches. Negative for dizziness, seizures and speech difficulty.  Psychiatric/Behavioral: Negative for hallucinations and confusion.    Allergies  Codeine  Home Medications   Current Outpatient Rx  Name Route Sig Dispense Refill  . ALBUTEROL SULFATE HFA 108 (90 BASE) MCG/ACT IN AERS Inhalation Inhale 2 puffs into the lungs every 6 (six) hours as needed. Shortness of Breath/COPD    . AMOXICILLIN 500 MG PO CAPS Oral Take 500 mg by mouth 3 (three) times daily.    Marland Kitchen DIAZEPAM 5 MG PO TABS Oral Take 5 mg by mouth 3 (three) times daily.      Marland Kitchen METFORMIN HCL 500 MG PO TABS Oral Take 500 mg by mouth daily.    . OXYCODONE-ACETAMINOPHEN 5-325 MG PO TABS Oral Take 2 tablets by mouth every 4 (four) hours as needed for pain. 20 tablet 0  . TRAZODONE HCL 100 MG PO TABS Oral Take 100 mg by mouth at bedtime as needed. Sleep      BP 101/83  Pulse 87  Temp(Src) 97.7 F (36.5 C) (Oral)  Resp 18  Ht 5\' 3"  (1.6 m)  SpO2 97%  LMP 04/07/2011  Physical Exam  Nursing note and vitals reviewed. Constitutional: She is oriented to person, place, and time. She appears well-developed and well-nourished.  Non-toxic appearance.  HENT:  Head: Normocephalic.  Right Ear: Tympanic membrane and external ear normal.  Left  Ear: Tympanic membrane and external ear normal.  Mouth/Throat: Mucous membranes are normal. Abnormal dentition. Dental caries present. No uvula swelling. No oropharyngeal exudate.  Eyes: EOM and lids are normal. Pupils are equal, round, and reactive to light.  Neck: Normal range of motion. Neck supple. Carotid bruit is not present.  Cardiovascular: Normal rate, regular rhythm, normal heart sounds, intact distal pulses and normal pulses.   Pulmonary/Chest: Breath sounds normal. No respiratory distress.  Abdominal: Soft. Bowel sounds are normal. There is no tenderness. There is no guarding.  Musculoskeletal: Normal range of motion.  Lymphadenopathy:        Head (right side): No submandibular adenopathy present.       Head (left side): No submandibular adenopathy present.    She has no cervical adenopathy.  Neurological: She is alert and oriented to person, place, and time. She has normal strength. No cranial nerve deficit or sensory deficit.  Skin: Skin is warm and dry.  Psychiatric: She has a normal mood and affect. Her speech is normal.    ED Course  Procedures (including critical care time)  Labs Reviewed - No data to display No results found.   No diagnosis found.    MDM  I have reviewed nursing notes, vital signs, and all appropriate lab and imaging results for this patient. Pt was treated with amoxicillin on 4/2. No fever or tachycardia or visible abscess. Rx for percocet given for pain. Pt strongly encouraged to see a dentist as soon as possible.       Kathie Dike, [place] 04/29/11 [phone]

## 2011-04-21 NOTE — ED Notes (Signed)
Pt reports was here recently for a toothache and was put on amoxicillin on 4/2.  Reports has been taking as directed.  Pt says pain came back this am.  No facial swellingnoted.

## 2011-04-21 NOTE — Discharge Instructions (Signed)
Toothache Toothaches are usually caused by tooth decay (cavity). However, other causes of toothache include:  Gum disease.   Cracked tooth.   Cracked filling.   Injury.   Jaw problem (temporo mandibular joint or TMJ disorder).   Tooth abscess.   Root sensitivity.   Grinding.   Eruption problems.  Swelling and redness around a painful tooth often means you have a dental abscess. Pain medicine and antibiotics can help reduce symptoms, but you will need to see a dentist within the next few days to have your problem properly evaluated and treated. If tooth decay is the problem, you may need a filling or root canal to save your tooth. If the problem is more severe, your tooth may need to be pulled. SEEK IMMEDIATE MEDICAL CARE IF:  You cannot swallow.   You develop severe swelling, increased redness, or increased pain in your mouth or face.   You have a fever.   You cannot open your mouth adequately.  Document Released: 02/06/2004 Document Revised: 12/18/2010 Document Reviewed: 03/28/2009 [employer] Patient Information 2012 [employer]. 

## 2011-04-30 NOTE — ED Provider Notes (Signed)
Medical screening examination/treatment/procedure(s) were performed by non-physician practitioner and as supervising physician I was immediately available for consultation/collaboration.   Eziah Negro III, MD 04/30/11 1154 

## 2011-05-12 ENCOUNTER — Emergency Department (HOSPITAL_COMMUNITY)
Admission: EM | Admit: 2011-05-12 | Discharge: 2011-05-12 | Disposition: A | Discharge status: Home / Self Care | Payer: Self-pay | Attending: Emergency Medicine | Admitting: Emergency Medicine

## 2011-05-12 ENCOUNTER — Encounter (HOSPITAL_COMMUNITY): Payer: Self-pay

## 2011-05-12 DIAGNOSIS — R45851 Suicidal ideations: Secondary | ICD-10-CM

## 2011-05-12 DIAGNOSIS — F319 Bipolar disorder, unspecified: Secondary | ICD-10-CM | POA: Insufficient documentation

## 2011-05-12 DIAGNOSIS — E119 Type 2 diabetes mellitus without complications: Secondary | ICD-10-CM | POA: Insufficient documentation

## 2011-05-12 DIAGNOSIS — Z046 Encounter for general psychiatric examination, requested by authority: Secondary | ICD-10-CM | POA: Insufficient documentation

## 2011-05-12 DIAGNOSIS — F1010 Alcohol abuse, uncomplicated: Secondary | ICD-10-CM

## 2011-05-12 DIAGNOSIS — Z79899 Other long term (current) drug therapy: Secondary | ICD-10-CM | POA: Insufficient documentation

## 2011-05-12 LAB — COMPREHENSIVE METABOLIC PANEL
ALT: 30 U/L (ref 0–35)
BUN: 9 mg/dL (ref 6–23)
CO2: 24 mEq/L (ref 19–32)
Calcium: 9.4 mg/dL (ref 8.4–10.5)
Creatinine, Ser: 0.5 mg/dL (ref 0.50–1.10)
GFR calc Af Amer: >90 mL/min (ref >90)
GFR calc non Af Amer: >90 mL/min (ref >90)
Glucose, Bld: 115 mg/dL — ABNORMAL HIGH (ref 70–99)
Sodium: 136 mEq/L (ref 135–145)
Total Protein: 7.6 g/dL (ref 6.0–8.3)

## 2011-05-12 LAB — URINALYSIS, ROUTINE W REFLEX MICROSCOPIC
Bilirubin Urine: NEGATIVE
Glucose, UA: NEGATIVE mg/dL
Ketones, ur: NEGATIVE mg/dL
Nitrite: NEGATIVE
Specific Gravity, Urine: <1.005 — ABNORMAL LOW (ref 1.005–1.030)
pH: 5.5 (ref 5.0–8.0)

## 2011-05-12 LAB — RAPID URINE DRUG SCREEN, HOSP PERFORMED
Barbiturates: NOT DETECTED
Benzodiazepines: NOT DETECTED
Cocaine: NOT DETECTED
Opiates: NOT DETECTED

## 2011-05-12 LAB — ETHANOL: Alcohol, Ethyl (B): 133 mg/dL — ABNORMAL HIGH (ref 0–11)

## 2011-05-12 LAB — CBC
HCT: 37 % (ref 36.0–46.0)
Hemoglobin: 12.2 g/dL (ref 12.0–15.0)
MCH: 29.3 pg (ref 26.0–34.0)
MCHC: 33 g/dL (ref 30.0–36.0)
MCV: 88.7 fL (ref 78.0–100.0)
RBC: 4.17 MIL/uL (ref 3.87–5.11)

## 2011-05-12 LAB — SALICYLATE LEVEL: Salicylate Lvl: 0.1 mg/dL — ABNORMAL LOW (ref 2.8–20.0)

## 2011-05-12 LAB — ACETAMINOPHEN LEVEL: Acetaminophen (Tylenol), Serum: <15 ug/mL (ref 10–30)

## 2011-05-12 MED ORDER — LORAZEPAM 1 MG PO TABS
1.0000 mg | ORAL_TABLET | Freq: Three times a day (TID) | ORAL | Status: DC | PRN
  Filled 2011-05-12 (×2): qty 1

## 2011-05-12 MED ORDER — NICOTINE 21 MG/24HR TD PT24
21.0000 mg | MEDICATED_PATCH | Freq: Every day | TRANSDERMAL | Status: DC
  Administered 2011-05-12: 21 mg via TRANSDERMAL
  Filled 2011-05-12: qty 1

## 2011-05-12 MED ORDER — FOLIC ACID 1 MG PO TABS
1.0000 mg | ORAL_TABLET | Freq: Every day | ORAL | Status: DC
  Administered 2011-05-12: 1 mg via ORAL
  Filled 2011-05-12: qty 1

## 2011-05-12 MED ORDER — VITAMIN B-1 100 MG PO TABS
100.0000 mg | ORAL_TABLET | Freq: Every day | ORAL | Status: DC
  Administered 2011-05-12: 100 mg via ORAL
  Filled 2011-05-12: qty 1

## 2011-05-12 MED ORDER — ADULT MULTIVITAMIN W/MINERALS CH
1.0000 | ORAL_TABLET | Freq: Every day | ORAL | Status: DC
  Administered 2011-05-12: 10:00:00 via ORAL
  Filled 2011-05-12: qty 1

## 2011-05-12 MED ORDER — LORAZEPAM 2 MG/ML IJ SOLN: 1.0000 mg | Freq: Four times a day (QID) | INTRAMUSCULAR | Status: DC | PRN

## 2011-05-12 MED ORDER — LORAZEPAM 1 MG PO TABS
1.0000 mg | ORAL_TABLET | Freq: Four times a day (QID) | ORAL | Status: DC | PRN
  Administered 2011-05-12 (×2): 1 mg via ORAL

## 2011-05-12 MED ORDER — IBUPROFEN 400 MG PO TABS: 600.0000 mg | ORAL_TABLET | Freq: Three times a day (TID) | ORAL | Status: DC | PRN

## 2011-05-12 MED ORDER — ZOLPIDEM TARTRATE 5 MG PO TABS: 5.0000 mg | ORAL_TABLET | Freq: Every evening | ORAL | Status: DC | PRN

## 2011-05-12 MED ORDER — ACETAMINOPHEN 325 MG PO TABS: 650.0000 mg | ORAL_TABLET | ORAL | Status: DC | PRN

## 2011-05-12 MED ORDER — ONDANSETRON HCL 4 MG PO TABS: 4.0000 mg | ORAL_TABLET | Freq: Three times a day (TID) | ORAL | Status: DC | PRN

## 2011-05-12 MED ORDER — ALUM & MAG HYDROXIDE-SIMETH 200-200-20 MG/5ML PO SUSP: 30.0000 mL | ORAL | Status: DC | PRN

## 2011-05-12 MED ORDER — METFORMIN HCL 500 MG PO TABS
500.0000 mg | ORAL_TABLET | Freq: Every day | ORAL | Status: DC
  Administered 2011-05-12: 500 mg via ORAL
  Filled 2011-05-12: qty 1

## 2011-05-12 MED ORDER — THIAMINE HCL 100 MG/ML IJ SOLN: 100.0000 mg | Freq: Every day | INTRAMUSCULAR | Status: DC

## 2011-05-12 MED ORDER — TRAZODONE HCL 50 MG PO TABS
100.0000 mg | ORAL_TABLET | Freq: Every day | ORAL | Status: DC
  Filled 2011-05-12: qty 1
  Filled 2011-05-12: qty 2

## 2011-05-12 NOTE — ED Notes (Signed)
[facility] called by RN to come transport patient to [facility].
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
## 2011-05-12 NOTE — ED Notes (Signed)
Pt sleeping. 

## 2011-05-12 NOTE — ED Notes (Signed)
Pt states she drinks daily at least 18 beers and she is "tired of it"  States "if you all don't send me somewhere i will take a bunch of my pills to kill myself"
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
## 2011-06-28 ENCOUNTER — Encounter (HOSPITAL_COMMUNITY): Payer: Self-pay | Admitting: Emergency Medicine

## 2011-06-28 ENCOUNTER — Emergency Department (HOSPITAL_COMMUNITY)
Admission: EM | Admit: 2011-06-28 | Discharge: 2011-06-28 | Disposition: A | Discharge status: Home / Self Care | Payer: Self-pay | Attending: Emergency Medicine | Admitting: Emergency Medicine

## 2011-06-28 DIAGNOSIS — K047 Periapical abscess without sinus: Secondary | ICD-10-CM | POA: Insufficient documentation

## 2011-06-28 DIAGNOSIS — F319 Bipolar disorder, unspecified: Secondary | ICD-10-CM | POA: Insufficient documentation

## 2011-06-28 DIAGNOSIS — E119 Type 2 diabetes mellitus without complications: Secondary | ICD-10-CM | POA: Insufficient documentation

## 2011-06-28 DIAGNOSIS — F411 Generalized anxiety disorder: Secondary | ICD-10-CM | POA: Insufficient documentation

## 2011-06-28 DIAGNOSIS — F17200 Nicotine dependence, unspecified, uncomplicated: Secondary | ICD-10-CM | POA: Insufficient documentation

## 2011-06-28 DIAGNOSIS — Z79899 Other long term (current) drug therapy: Secondary | ICD-10-CM | POA: Insufficient documentation

## 2011-06-28 MED ORDER — HYDROCODONE-ACETAMINOPHEN 5-325 MG PO TABS
1.0000 | ORAL_TABLET | Freq: Once | ORAL | Status: AC
  Administered 2011-06-28: 1 via ORAL
  Filled 2011-06-28: qty 1

## 2011-06-28 MED ORDER — AMOXICILLIN 500 MG PO CAPS: 500.0000 mg | ORAL_CAPSULE | Freq: Once | ORAL | Status: DC

## 2011-06-28 MED ORDER — HYDROCODONE-ACETAMINOPHEN 5-325 MG PO TABS: 1.0000 | ORAL_TABLET | ORAL | Status: AC | PRN

## 2011-06-28 MED ORDER — AMOXICILLIN 250 MG PO CAPS
500.0000 mg | ORAL_CAPSULE | Freq: Once | ORAL | Status: AC
  Administered 2011-06-28: 500 mg via ORAL
  Filled 2011-06-28: qty 2

## 2011-06-28 MED ORDER — AMOXICILLIN 500 MG PO CAPS: 500.0000 mg | ORAL_CAPSULE | Freq: Three times a day (TID) | ORAL | Status: AC

## 2011-06-28 MED ORDER — HYDROCODONE-ACETAMINOPHEN 5-325 MG PO TABS: 1.0000 | ORAL_TABLET | Freq: Once | ORAL | Status: DC

## 2011-06-28 NOTE — ED Provider Notes (Signed)
Medical screening examination/treatment/procedure(s) were performed by non-physician practitioner and as supervising physician I was immediately available for consultation/collaboration.   Damaria Vachon M Yuto Cajuste, DO 06/28/11 1657 

## 2011-06-28 NOTE — ED Provider Notes (Signed)
History     CSN: [id number]  Arrival date & time 06/28/11  1302   First MD Initiated Contact with Patient 06/28/11 1317      Chief Complaint  Patient presents with  . Dental Pain    (Consider location/radiation/quality/duration/timing/severity/associated sxs/prior treatment) HPI Comments: Patient presents with a three-day history of right upper dental pain and swelling.  She states she has a history of intermittent abscess formation in her right upper first molar tooth which has not been addressed by a dentist to date.  She denies fevers or chills.  Pain is constant and throbbing and not relieved with Tylenol.  Patient is a 41 y.o. female presenting with tooth pain. The history is provided by the patient.  Dental PainPrimary symptoms do not include fever, shortness of breath or sore throat.  Additional symptoms do not include: facial swelling.    Past Medical History  Diagnosis Date  . Anxiety   . Bipolar 1 disorder   . Depression   . Liver disease   . Diabetes mellitus     History reviewed. No pertinent past surgical history.  No family history on file.  History  Substance Use Topics  . Smoking status: Current Everyday Smoker -- 1.0 packs/day for 15 years    Types: Cigarettes  . Smokeless tobacco: Not on file  . Alcohol Use: 3.6 oz/week    6 Cans of beer per week     occasionally    OB History    Grav Para Term Preterm Abortions TAB SAB Ect Mult Living                  Review of Systems  Constitutional: Negative for fever.  HENT: Positive for dental problem. Negative for sore throat, facial swelling, neck pain and neck stiffness.   Respiratory: Negative for shortness of breath.     Allergies  Codeine  Home Medications   Current Outpatient Rx  Name Route Sig Dispense Refill  . ALBUTEROL SULFATE HFA 108 (90 BASE) MCG/ACT IN AERS Inhalation Inhale 2 puffs into the lungs every 6 (six) hours as needed. Shortness of Breath/COPD    . DIAZEPAM 5 MG PO TABS  Oral Take 5 mg by mouth 3 (three) times daily.      Marland Kitchen METFORMIN HCL 500 MG PO TABS Oral Take 500 mg by mouth daily.    . OXYCODONE-ACETAMINOPHEN 5-325 MG PO TABS Oral Take 1 tablet by mouth every 4 (four) hours as needed.    . TRAZODONE HCL 100 MG PO TABS Oral Take 100 mg by mouth at bedtime as needed. Sleep      BP 113/78  Pulse 84  Temp 98.6 F (37 C) (Oral)  Resp 18  Ht 5\' 4"  (1.626 m)  Wt 180 lb (81.647 kg)  BMI 30.90 kg/m2  SpO2 100%  Physical Exam  Constitutional: She is oriented to person, place, and time. She appears well-developed and well-nourished. No distress.  HENT:  Head: Normocephalic and atraumatic.  Right Ear: Tympanic membrane and external ear normal.  Left Ear: Tympanic membrane and external ear normal.  Mouth/Throat: Oropharynx is clear and moist and mucous membranes are normal. No oral lesions. Dental abscesses present.    Eyes: Conjunctivae are normal.  Neck: Normal range of motion. Neck supple.  Cardiovascular: Normal rate and normal heart sounds.   Pulmonary/Chest: Effort normal.  Abdominal: She exhibits no distension.  Musculoskeletal: Normal range of motion.  Lymphadenopathy:    She has no cervical adenopathy.  Neurological: She is  alert and oriented to person, place, and time.  Skin: Skin is warm and dry. No erythema.  Psychiatric: She has a normal mood and affect.    ED Course  Procedures (including critical care time)  Labs Reviewed - No data to display No results found.   1. Dental abscess       MDM  Referral list given to pt.  Hydrocodone,  Amoxil with first doses given here.  Stable vital signs,  No facial edema or signs of severe infection.          Burgess Amor, [place] 06/28/11 1354

## 2011-06-28 NOTE — ED Notes (Signed)
Patient with c/o right upper dental pain x 2 days. Poor dental and personal hygiene noted.

## 2011-06-28 NOTE — Discharge Instructions (Signed)
Abscessed Tooth A tooth abscess is a collection of infected fluid (pus) from a bacterial infection in the inner part of the tooth (pulp). It usually occurs at the end of the tooth's root.  CAUSES   A very bad cavity (extensive tooth decay).   Trauma to the tooth, such as a broken or chipped tooth, that allows bacteria to enter into the pulp.  SYMPTOMS  Severe pain in and around the infected tooth.   Swelling and redness around the abscessed tooth or in the mouth or face.   Tenderness.   Pus drainage.   Bad breath.   Bitter taste in the mouth.   Difficulty swallowing.   Difficulty opening the mouth.   Feeling sick to your stomach (nauseous).   Vomiting.   Chills.   Swollen neck glands.  DIAGNOSIS  A medical and dental history will be taken.   An examination will be performed by tapping on the abscessed tooth.   X-rays may be taken of the tooth to identify the abscess.  TREATMENT The goal of treatment is to eliminate the infection.   You may be prescribed antibiotic medicine to stop the infection from spreading.   A root canal may be performed to save the tooth. If the tooth cannot be saved, it may be pulled (extracted) and the abscess may be drained.  HOME CARE INSTRUCTIONS  Only take over-the-counter or prescription medicines for pain, fever, or discomfort as directed by your caregiver.   Do not drive after taking pain medicine (narcotics).   Rinse your mouth (gargle) often with salt water ( tsp salt in 8 oz of warm water) to relieve pain or swelling.   Do not apply heat to the outside of your face.   Return to your dentist for further treatment as directed.  SEEK IMMEDIATE DENTAL CARE IF:  You have a temperature by mouth above 102 F (38.9 C), not controlled by medicine.   You have chills or a very bad headache.   You have problems breathing or swallowing.   Your have trouble opening your mouth.   You develop swelling in the neck or around the eye.    Your pain is not helped by medicine.   Your pain is getting worse instead of better.  Document Released: 12/29/2004 Document Revised: 12/18/2010 Document Reviewed: 04/08/2010 [facility] Patient Information 2012 [place], [place].   The medications prescribed for your dental pain.Complete your entire course of antibiotics as prescribed.  You  may use the hydrocodone for pain relief but do not drive within 4 hours of taking as this will make you drowsy.  Avoid applying heat or ice to this abscess area which can worsen your symptoms.  You may use warm salt water swish and spit treatment or half peroxide and water swish and spit after meals to keep this area clean as discussed.

## 2011-10-14 ENCOUNTER — Emergency Department (HOSPITAL_COMMUNITY)
Admission: EM | Admit: 2011-10-14 | Discharge: 2011-10-14 | Disposition: A | Discharge status: Home / Self Care | Payer: Self-pay | Attending: Emergency Medicine | Admitting: Emergency Medicine

## 2011-10-14 ENCOUNTER — Encounter (HOSPITAL_COMMUNITY): Payer: Self-pay | Admitting: *Deleted

## 2011-10-14 DIAGNOSIS — K0889 Other specified disorders of teeth and supporting structures: Secondary | ICD-10-CM

## 2011-10-14 DIAGNOSIS — F411 Generalized anxiety disorder: Secondary | ICD-10-CM | POA: Insufficient documentation

## 2011-10-14 DIAGNOSIS — F17200 Nicotine dependence, unspecified, uncomplicated: Secondary | ICD-10-CM | POA: Insufficient documentation

## 2011-10-14 DIAGNOSIS — K029 Dental caries, unspecified: Secondary | ICD-10-CM | POA: Insufficient documentation

## 2011-10-14 DIAGNOSIS — F319 Bipolar disorder, unspecified: Secondary | ICD-10-CM | POA: Insufficient documentation

## 2011-10-14 DIAGNOSIS — E119 Type 2 diabetes mellitus without complications: Secondary | ICD-10-CM | POA: Insufficient documentation

## 2011-10-14 MED ORDER — PENICILLIN V POTASSIUM 500 MG PO TABS: 500.0000 mg | ORAL_TABLET | Freq: Four times a day (QID) | ORAL | Status: AC

## 2011-10-14 MED ORDER — PENICILLIN V POTASSIUM 250 MG PO TABS
500.0000 mg | ORAL_TABLET | Freq: Once | ORAL | Status: AC
  Administered 2011-10-14: 500 mg via ORAL
  Filled 2011-10-14: qty 2

## 2011-10-14 MED ORDER — OXYCODONE-ACETAMINOPHEN 5-325 MG PO TABS: ORAL_TABLET | ORAL | Status: DC

## 2011-10-14 MED ORDER — OXYCODONE-ACETAMINOPHEN 5-325 MG PO TABS
1.0000 | ORAL_TABLET | Freq: Once | ORAL | Status: AC
  Administered 2011-10-14: 1 via ORAL
  Filled 2011-10-14: qty 1

## 2011-10-14 MED ORDER — IBUPROFEN 800 MG PO TABS
800.0000 mg | ORAL_TABLET | Freq: Once | ORAL | Status: AC
  Administered 2011-10-14: 800 mg via ORAL
  Filled 2011-10-14: qty 1

## 2011-10-14 NOTE — ED Provider Notes (Signed)
History     CSN: [id number]  Arrival date & time 10/14/11  1729   First MD Initiated Contact with Patient 10/14/11 1825      Chief Complaint  Patient presents with  . Dental Pain    (Consider location/radiation/quality/duration/timing/severity/associated sxs/prior treatment) HPI Comments: Has no dentist.  "i guess i will go to a dental clinic".  Patient is a 41 y.o. female presenting with tooth pain. The history is provided by the patient. No language interpreter was used.  Dental PainThe primary symptoms include mouth pain. Primary symptoms do not include dental injury or fever. The symptoms began 2 days ago. The symptoms are worsening. The symptoms occur constantly.  Additional symptoms do not include: gum swelling, purulent gums, trismus, jaw pain, facial swelling and trouble swallowing.    Past Medical History  Diagnosis Date  . Anxiety   . Bipolar 1 disorder   . Depression   . Liver disease   . Diabetes mellitus     History reviewed. No pertinent past surgical history.  No family history on file.  History  Substance Use Topics  . Smoking status: Current Every Day Smoker -- 1.0 packs/day for 15 years    Types: Cigarettes  . Smokeless tobacco: Not on file  . Alcohol Use: 3.6 oz/week    6 Cans of beer per week     occasionally    OB History    Grav Para Term Preterm Abortions TAB SAB Ect Mult Living                  Review of Systems  Constitutional: Negative for fever and chills.  HENT: Positive for dental problem. Negative for facial swelling and trouble swallowing.   All other systems reviewed and are negative.    Allergies  Codeine  Home Medications   Current Outpatient Rx  Name Route Sig Dispense Refill  . ALBUTEROL SULFATE HFA 108 (90 BASE) MCG/ACT IN AERS Inhalation Inhale 2 puffs into the lungs every 6 (six) hours as needed. Shortness of Breath/COPD    . DIAZEPAM 5 MG PO TABS Oral Take 5 mg by mouth 3 (three) times daily.      Marland Kitchen METFORMIN  HCL 500 MG PO TABS Oral Take 500 mg by mouth daily.    . OXYCODONE-ACETAMINOPHEN 5-325 MG PO TABS Oral Take 1 tablet by mouth every 4 (four) hours as needed.    . OXYCODONE-ACETAMINOPHEN 5-325 MG PO TABS  One tab po q 6 hrs prn pain 12 tablet 0  . PENICILLIN V POTASSIUM 500 MG PO TABS Oral Take 1 tablet (500 mg total) by mouth 4 (four) times daily. 40 tablet 0  . TRAZODONE HCL 100 MG PO TABS Oral Take 100 mg by mouth at bedtime as needed. Sleep      BP 121/82  Pulse 93  Temp 98.4 F (36.9 C) (Oral)  Resp 20  Ht 5\' 3"  (1.6 m)  Wt 185 lb (83.915 kg)  BMI 32.77 kg/m2  SpO2 97%  LMP 09/30/2011  Physical Exam  Nursing note and vitals reviewed. Constitutional: She is oriented to person, place, and time. She appears well-developed and well-nourished. No distress.  HENT:  Head: Normocephalic and atraumatic. No trismus in the jaw.  Mouth/Throat: Uvula is midline, oropharynx is clear and moist and mucous membranes are normal. Dental caries present. No dental abscesses or uvula swelling.    Eyes: EOM are normal.  Neck: Normal range of motion.  Cardiovascular: Normal rate, regular rhythm and normal heart sounds.  Pulmonary/Chest: Effort normal and breath sounds normal.  Abdominal: Soft. She exhibits no distension. There is no tenderness.  Musculoskeletal: Normal range of motion.  Neurological: She is alert and oriented to person, place, and time.  Skin: Skin is warm and dry.  Psychiatric: She has a normal mood and affect. Judgment normal.    ED Course  Procedures (including critical care time)  Labs Reviewed  GLUCOSE, CAPILLARY - Abnormal; Notable for the following:    Glucose-Capillary 135 (*)     All other components within normal limits   No results found.   1. Pain, dental       MDM  No apparent abscess rx-pen VK 500 mg, 40 rx-percocet, 12 Ibuprofen F/u with dentist ASAP.        Evalina Field, [place] 10/14/11 1840

## 2011-10-14 NOTE — ED Notes (Signed)
Dental pain began yesterday. NAD.  

## 2011-10-15 NOTE — ED Provider Notes (Signed)
Medical screening examination/treatment/procedure(s) were performed by non-physician practitioner and as supervising physician I was immediately available for consultation/collaboration.   Jamiah Recore W. Laurent Cargile, MD 10/15/11 1605 

## 2011-10-31 ENCOUNTER — Emergency Department (HOSPITAL_COMMUNITY)
Admission: EM | Admit: 2011-10-31 | Discharge: 2011-11-01 | Disposition: A | Discharge status: Home / Self Care | Payer: Self-pay | Attending: Emergency Medicine | Admitting: Emergency Medicine

## 2011-10-31 ENCOUNTER — Encounter (HOSPITAL_COMMUNITY): Payer: Self-pay

## 2011-10-31 DIAGNOSIS — T424X1A Poisoning by benzodiazepines, accidental (unintentional), initial encounter: Secondary | ICD-10-CM | POA: Insufficient documentation

## 2011-10-31 DIAGNOSIS — R4189 Other symptoms and signs involving cognitive functions and awareness: Secondary | ICD-10-CM

## 2011-10-31 DIAGNOSIS — F319 Bipolar disorder, unspecified: Secondary | ICD-10-CM | POA: Insufficient documentation

## 2011-10-31 DIAGNOSIS — F341 Dysthymic disorder: Secondary | ICD-10-CM | POA: Insufficient documentation

## 2011-10-31 DIAGNOSIS — T40601A Poisoning by unspecified narcotics, accidental (unintentional), initial encounter: Secondary | ICD-10-CM | POA: Insufficient documentation

## 2011-10-31 DIAGNOSIS — T424X4A Poisoning by benzodiazepines, undetermined, initial encounter: Secondary | ICD-10-CM | POA: Insufficient documentation

## 2011-10-31 DIAGNOSIS — Y92009 Unspecified place in unspecified non-institutional (private) residence as the place of occurrence of the external cause: Secondary | ICD-10-CM | POA: Insufficient documentation

## 2011-10-31 DIAGNOSIS — E119 Type 2 diabetes mellitus without complications: Secondary | ICD-10-CM | POA: Insufficient documentation

## 2011-10-31 DIAGNOSIS — T50901A Poisoning by unspecified drugs, medicaments and biological substances, accidental (unintentional), initial encounter: Secondary | ICD-10-CM

## 2011-10-31 DIAGNOSIS — T400X1A Poisoning by opium, accidental (unintentional), initial encounter: Secondary | ICD-10-CM | POA: Insufficient documentation

## 2011-10-31 LAB — CBC WITH DIFFERENTIAL/PLATELET
Eosinophils Relative: 1 % (ref 0–5)
HCT: 38 % (ref 36.0–46.0)
Lymphocytes Relative: 41 % (ref 12–46)
Lymphs Abs: 4.3 10*3/uL — ABNORMAL HIGH (ref 0.7–4.0)
MCV: 90.5 fL (ref 78.0–100.0)
Platelets: 342 10*3/uL (ref 150–400)
RBC: 4.2 MIL/uL (ref 3.87–5.11)
WBC: 10.5 10*3/uL (ref 4.0–10.5)

## 2011-10-31 LAB — COMPREHENSIVE METABOLIC PANEL
ALT: 26 U/L (ref 0–35)
Alkaline Phosphatase: 111 U/L (ref 39–117)
CO2: 28 mEq/L (ref 19–32)
Calcium: 9.1 mg/dL (ref 8.4–10.5)
GFR calc Af Amer: >90 mL/min (ref >90)
GFR calc non Af Amer: >90 mL/min (ref >90)
Glucose, Bld: 122 mg/dL — ABNORMAL HIGH (ref 70–99)
Sodium: 139 mEq/L (ref 135–145)

## 2011-10-31 LAB — RAPID URINE DRUG SCREEN, HOSP PERFORMED
Barbiturates: NOT DETECTED
Benzodiazepines: POSITIVE — AB
Cocaine: NOT DETECTED
Tetrahydrocannabinol: POSITIVE — AB

## 2011-10-31 LAB — URINALYSIS, ROUTINE W REFLEX MICROSCOPIC
Glucose, UA: NEGATIVE mg/dL
Ketones, ur: NEGATIVE mg/dL
pH: 5 (ref 5.0–8.0)

## 2011-10-31 MED ORDER — NALOXONE HCL 1 MG/ML IJ SOLN
1.0000 mg | Freq: Once | INTRAMUSCULAR | Status: AC
Start: 2011-10-31 — End: 2011-10-31
  Administered 2011-10-31: 1 mg via INTRAVENOUS

## 2011-10-31 MED ORDER — NALOXONE HCL 1 MG/ML IJ SOLN
INTRAMUSCULAR | Status: AC
  Filled 2011-10-31: qty 2

## 2011-10-31 NOTE — ED Notes (Signed)
Pt up to bedside commode for urine, no difficulty, remains awake and alert

## 2011-10-31 NOTE — ED Notes (Signed)
Pt in via [facility] with possible overdose. Fentanyl patch that she chewed and unknown possible other meds, possibly xanax

## 2011-10-31 NOTE — ED Provider Notes (Signed)
History   This chart was scribed for Ward Givens, MD scribed by Magnus Sinning. The patient was seen in room APA01/APA01 at 20:51   CSN: [id number]  Arrival date & time 10/31/11  2033   Chief Complaint  Patient presents with  . Drug Overdose    (Consider location/radiation/quality/duration/timing/severity/associated sxs/prior treatment) The history is provided by the EMS personnel. No language interpreter was used.  Level 5 Caveat- Altered mental  Status Julie Burnett is a 41 y.o. female who presents to the Emergency Department for EVAL following a drug overdose that occurred this evening. Per nurse, the daughter called EMS because the pt was not breathing well. She says earlier today she took four 5 mg xanax and two klonopin and chewed a fentanyl patch.   Pt was brought by [facility] this evening for possible overdose of Fentanyl, Klonopin and Xanax. Per nurse, at arrival the patient explained "she chewed on a fentanyl patch and did not know it was going to be that strong."  Patient denies SI and states she took medication to "get high." Patient has hx of drug overdose, depression and bipolar disorder. Patient also has hx of DM,which she treats with medications.  She denies use of oxycodone,and  valium, but says she is still taking trazadone  Patient was not given narcan in en route by EMS.  PCP: Dr. Jorene Guest  Past Medical History  Diagnosis Date  . Anxiety   . Bipolar 1 disorder   . Depression   . Liver disease   . Diabetes mellitus     History reviewed. No pertinent past surgical history.  No family history on file.  History  Substance Use Topics  . Smoking status: Current Every Day Smoker -- 1.0 packs/day for 15 years    Types: Cigarettes  . Smokeless tobacco: Not on file  . Alcohol Use: 3.6 oz/week    6 Cans of beer per week     occasionally  Unemployed   Review of Systems  Unable to perform ROS: Mental status change    Allergies  Codeine  Home  Medications   Current Outpatient Rx  Name Route Sig Dispense Refill  . ALBUTEROL SULFATE HFA 108 (90 BASE) MCG/ACT IN AERS Inhalation Inhale 2 puffs into the lungs every 6 (six) hours as needed. Shortness of Breath/COPD    . METFORMIN HCL 500 MG PO TABS Oral Take 500 mg by mouth daily.    . TRAZODONE HCL 100 MG PO TABS Oral Take 100 mg by mouth at bedtime as needed. Sleep      BP 150/105  Pulse 98  Temp 98.4 F (36.9 C) (Oral)  Resp 16  SpO2 100%  LMP 09/30/2011  Physical Exam  Nursing note and vitals reviewed. Constitutional: She is oriented to person, place, and time. She appears well-developed and well-nourished. No distress.       On arrival pt was unresponsive to verbal or physical stimulus. She sat up with ammonia capsule and spoke briefly and then became unresponsive again . After given narcan pt immediately woke up and started talking   HENT:  Head: Normocephalic and atraumatic.  Right Ear: External ear normal.  Left Ear: External ear normal.  Nose: Nose normal.  Mouth/Throat: Oropharynx is clear and moist.  Eyes: Conjunctivae normal and EOM are normal. Pupils are equal, round, and reactive to light.  Neck: Neck supple. No tracheal deviation present.  Cardiovascular: Normal rate.   Pulmonary/Chest: Effort normal. No respiratory distress.  Abdominal: She exhibits no  distension.  Musculoskeletal: Normal range of motion.  Neurological: She is alert and oriented to person, place, and time. No sensory deficit.  Skin: Skin is warm and dry.  Psychiatric:       Patient tearful and crying    ED Course  Procedures (including critical care time)   Medications  naloxone (NARCAN) injection 1 mg (1 mg Intravenous Given 10/31/11 2103)     DIAGNOSTIC STUDIES: Oxygen Saturation is 91% on [place], adequate by my interpretation.    COORDINATION OF CARE:  I was considering intubation when patient didn't stay awake after ammonia capsule, however she responded well to narcan.   PT  immediately woke up after narcan and started talking. States she was trying to get high. States she took 2 klonopin this morning and took 4 peach xanax today and chewed a fentanyl patch. Pt has hx of depression and bipolar disorder.  21:00Per daughter, they were in the house just folding clothes and talking when the pt suddenly fell over.  She says the pt was pale and her lips were grey and that she did not feel her breathing when they placed a hand over the pt's mouth. The patient's boyfriend did CPR.  She says she was unaware that her mother had chewed the fentanyl patch and explains this behavior is atypical of her mother.                                                                  22:55: Performed recheck. Patient is wide-awake,talking, and in no distress.Discussed getting telepsych consult. Patient agreeable  00:30 waiting for telepsych consult. Turned over to Dr Colon Branch for disposition  Results for orders placed during the hospital encounter of 10/31/11  CBC WITH DIFFERENTIAL      Component Value Range   WBC 10.5  4.0 - 10.5 K/uL   RBC 4.20  3.87 - 5.11 MIL/uL   Hemoglobin 12.0  12.0 - 15.0 g/dL   HCT 16.1  09.6 - 04.5 %   MCV 90.5  78.0 - 100.0 fL   MCH 28.6  26.0 - 34.0 pg   MCHC 31.6  30.0 - 36.0 g/dL   RDW 40.9 (*) 81.1 - 91.4 %   Platelets 342  150 - 400 K/uL   Neutrophils Relative 51  43 - 77 %   Neutro Abs 5.3  1.7 - 7.7 K/uL   Lymphocytes Relative 41  12 - 46 %   Lymphs Abs 4.3 (*) 0.7 - 4.0 K/uL   Monocytes Relative 7  3 - 12 %   Monocytes Absolute 0.7  0.1 - 1.0 K/uL   Eosinophils Relative 1  0 - 5 %   Eosinophils Absolute 0.2  0.0 - 0.7 K/uL   Basophils Relative 0  0 - 1 %   Basophils Absolute 0.0  0.0 - 0.1 K/uL  COMPREHENSIVE METABOLIC PANEL      Component Value Range   Sodium 139  135 - 145 mEq/L   Potassium 4.1  3.5 - 5.1 mEq/L   Chloride 103  96 - 112 mEq/L   CO2 28  19 - 32 mEq/L   Glucose, Bld 122 (*) 70 - 99 mg/dL   BUN 9  6 - 23 mg/dL   Creatinine,  Ser 7.82  0.50 - 1.10 mg/dL   Calcium 9.1  8.4 - 16.1 mg/dL   Total Protein 8.0  6.0 - 8.3 g/dL   Albumin 3.9  3.5 - 5.2 g/dL   AST 23  0 - 37 U/L   ALT 26  0 - 35 U/L   Alkaline Phosphatase 111  39 - 117 U/L   Total Bilirubin 0.2 (*) 0.3 - 1.2 mg/dL   GFR calc non Af Amer >90  >90 mL/min   GFR calc Af Amer >90  >90 mL/min  ETHANOL      Component Value Range   Alcohol, Ethyl (B) <11  0 - 11 mg/dL  SALICYLATE LEVEL      Component Value Range   Salicylate Lvl <2.0 (*) 2.8 - 20.0 mg/dL  ACETAMINOPHEN LEVEL      Component Value Range   Acetaminophen (Tylenol), Serum <15.0  10 - 30 ug/mL  URINALYSIS, ROUTINE W REFLEX MICROSCOPIC      Component Value Range   Color, Urine YELLOW  YELLOW   APPearance CLOUDY (*) CLEAR   Specific Gravity, Urine >1.030 (*) 1.005 - 1.030   pH 5.0  5.0 - 8.0   Glucose, UA NEGATIVE  NEGATIVE mg/dL   Hgb urine dipstick LARGE (*) NEGATIVE   Bilirubin Urine SMALL (*) NEGATIVE   Ketones, ur NEGATIVE  NEGATIVE mg/dL   Protein, ur 30 (*) NEGATIVE mg/dL   Urobilinogen, UA 0.2  0.0 - 1.0 mg/dL   Nitrite NEGATIVE  NEGATIVE   Leukocytes, UA NEGATIVE  NEGATIVE  PREGNANCY, URINE      Component Value Range   Preg Test, Ur NEGATIVE  NEGATIVE  URINE RAPID DRUG SCREEN (HOSP PERFORMED)      Component Value Range   Opiates POSITIVE (*) NONE DETECTED   Cocaine NONE DETECTED  NONE DETECTED   Benzodiazepines POSITIVE (*) NONE DETECTED   Amphetamines NONE DETECTED  NONE DETECTED   Tetrahydrocannabinol POSITIVE (*) NONE DETECTED   Barbiturates NONE DETECTED  NONE DETECTED  TROPONIN I      Component Value Range   Troponin I <0.30  <0.30 ng/mL  URINE MICROSCOPIC-ADD ON      Component Value Range   Squamous Epithelial / LPF FEW (*) RARE   WBC, UA 3-6  <3 WBC/hpf   RBC / HPF TOO NUMEROUS TO COUNT  <3 RBC/hpf        Date: 10/31/2011  Rate: 89  Rhythm: normal sinus rhythm  QRS Axis: normal  Intervals: normal  ST/T Wave abnormalities: nonspecific T wave changes   Conduction Disutrbances:none  Narrative Interpretation:   Old EKG Reviewed: changes noted new inverted T waves anteriorly    1. Overdose   2. Unresponsive   3. Narcotic overdose    CRITICAL CARE Performed by: Devoria Albe L   Total critical care time: 31 minutes Critical care time was exclusive of separately billable procedures and treating other patients.  Critical care was necessary to treat or prevent imminent or life-threatening deterioration.  Critical care was time spent personally by me on the following activities: development of treatment plan with patient and/or surrogate as well as nursing, discussions with consultants, evaluation of patient's response to treatment, examination of patient, obtaining history from patient or surrogate, ordering and performing treatments and interventions, ordering and review of laboratory studies, ordering and review of radiographic studies, pulse oximetry and re-evaluation of patient's condition.    MDM   I personally performed the services described in this documentation, which was scribed in my presence. The recorded  information has been reviewed and considered.  Devoria Albe, MD, FACEP        Ward Givens, MD 11/01/11 [phone]

## 2011-11-01 NOTE — ED Provider Notes (Signed)
0130 Assumed care/disposition of patient. Patient arrived unresponsive having chewed on fentanyl patch. She awakened with administration of narcan. She denies suicidal ideation. She claimed she took the fentanyl to get high. Awaiting tele-psych  To assist with decision regarding need for psychiatric admission. 36 Spoke with Dr. Trisha Mangle, psychiatrist. He will be performing the tele-psych. 0300 Tele-psych evaluation  Indicates patient can be discharged home. She does not meet criteria for admission. She is to follow up with [facility].  Nicoletta Dress. Colon Branch, MD 11/01/11 [phone]

## 2011-11-01 NOTE — ED Notes (Signed)
Telepsych completed.  

## 2011-11-10 ENCOUNTER — Emergency Department (HOSPITAL_COMMUNITY)
Admission: EM | Admit: 2011-11-10 | Discharge: 2011-11-11 | Disposition: A | Discharge status: Other Acute Inpatient Hospital | Payer: Self-pay | Attending: Emergency Medicine | Admitting: Emergency Medicine

## 2011-11-10 ENCOUNTER — Encounter (HOSPITAL_COMMUNITY): Payer: Self-pay | Admitting: *Deleted

## 2011-11-10 DIAGNOSIS — R45851 Suicidal ideations: Secondary | ICD-10-CM | POA: Insufficient documentation

## 2011-11-10 DIAGNOSIS — Z046 Encounter for general psychiatric examination, requested by authority: Secondary | ICD-10-CM | POA: Insufficient documentation

## 2011-11-10 DIAGNOSIS — F319 Bipolar disorder, unspecified: Secondary | ICD-10-CM | POA: Insufficient documentation

## 2011-11-10 DIAGNOSIS — F3289 Other specified depressive episodes: Secondary | ICD-10-CM | POA: Insufficient documentation

## 2011-11-10 DIAGNOSIS — F411 Generalized anxiety disorder: Secondary | ICD-10-CM | POA: Insufficient documentation

## 2011-11-10 DIAGNOSIS — F10929 Alcohol use, unspecified with intoxication, unspecified: Secondary | ICD-10-CM

## 2011-11-10 DIAGNOSIS — E119 Type 2 diabetes mellitus without complications: Secondary | ICD-10-CM | POA: Insufficient documentation

## 2011-11-10 DIAGNOSIS — F1010 Alcohol abuse, uncomplicated: Secondary | ICD-10-CM | POA: Insufficient documentation

## 2011-11-10 DIAGNOSIS — F329 Major depressive disorder, single episode, unspecified: Secondary | ICD-10-CM

## 2011-11-10 DIAGNOSIS — K709 Alcoholic liver disease, unspecified: Secondary | ICD-10-CM | POA: Insufficient documentation

## 2011-11-10 LAB — BASIC METABOLIC PANEL
BUN: 7 mg/dL (ref 6–23)
Chloride: 97 mEq/L (ref 96–112)
GFR calc Af Amer: >90 mL/min (ref >90)
Glucose, Bld: 122 mg/dL — ABNORMAL HIGH (ref 70–99)
Potassium: 3.5 mEq/L (ref 3.5–5.1)
Sodium: 135 mEq/L (ref 135–145)

## 2011-11-10 LAB — CBC WITH DIFFERENTIAL/PLATELET
Basophils Relative: 0 % (ref 0–1)
HCT: 37.9 % (ref 36.0–46.0)
Hemoglobin: 12.4 g/dL (ref 12.0–15.0)
Lymphs Abs: 3.2 10*3/uL (ref 0.7–4.0)
MCH: 28.6 pg (ref 26.0–34.0)
MCHC: 32.7 g/dL (ref 30.0–36.0)
Monocytes Absolute: 0.5 10*3/uL (ref 0.1–1.0)
Monocytes Relative: 5 % (ref 3–12)
Neutro Abs: 4.7 10*3/uL (ref 1.7–7.7)
RBC: 4.33 MIL/uL (ref 3.87–5.11)

## 2011-11-10 LAB — RAPID URINE DRUG SCREEN, HOSP PERFORMED
Cocaine: NOT DETECTED
Opiates: NOT DETECTED

## 2011-11-10 MED ORDER — LORAZEPAM 1 MG PO TABS
1.0000 mg | ORAL_TABLET | Freq: Four times a day (QID) | ORAL | Status: DC | PRN
  Filled 2011-11-10: qty 1

## 2011-11-10 MED ORDER — IBUPROFEN 400 MG PO TABS: 400.0000 mg | ORAL_TABLET | Freq: Three times a day (TID) | ORAL | Status: DC | PRN

## 2011-11-10 MED ORDER — LORAZEPAM 1 MG PO TABS
0.0000 mg | ORAL_TABLET | Freq: Four times a day (QID) | ORAL | Status: DC
  Administered 2011-11-11: 1 mg via ORAL

## 2011-11-10 MED ORDER — THIAMINE HCL 100 MG/ML IJ SOLN: 100.0000 mg | Freq: Every day | INTRAMUSCULAR | Status: DC

## 2011-11-10 MED ORDER — ACETAMINOPHEN 325 MG PO TABS
650.0000 mg | ORAL_TABLET | ORAL | Status: DC | PRN
  Administered 2011-11-11: 650 mg via ORAL
  Filled 2011-11-10: qty 2

## 2011-11-10 MED ORDER — VITAMIN B-1 100 MG PO TABS
100.0000 mg | ORAL_TABLET | Freq: Every day | ORAL | Status: DC
  Administered 2011-11-11: 100 mg via ORAL
  Filled 2011-11-10: qty 1

## 2011-11-10 MED ORDER — NICOTINE 21 MG/24HR TD PT24
21.0000 mg | MEDICATED_PATCH | Freq: Every day | TRANSDERMAL | Status: DC | PRN
  Administered 2011-11-11: 21 mg via TRANSDERMAL
  Filled 2011-11-10: qty 1

## 2011-11-10 MED ORDER — ONDANSETRON HCL 4 MG PO TABS: 4.0000 mg | ORAL_TABLET | Freq: Three times a day (TID) | ORAL | Status: DC | PRN

## 2011-11-10 MED ORDER — FOLIC ACID 1 MG PO TABS
1.0000 mg | ORAL_TABLET | Freq: Every day | ORAL | Status: DC
  Administered 2011-11-11: 1 mg via ORAL
  Filled 2011-11-10: qty 1

## 2011-11-10 MED ORDER — ADULT MULTIVITAMIN W/MINERALS CH
1.0000 | ORAL_TABLET | Freq: Every day | ORAL | Status: DC
  Administered 2011-11-11: 1 via ORAL
  Filled 2011-11-10: qty 1

## 2011-11-10 MED ORDER — CITALOPRAM HYDROBROMIDE 20 MG PO TABS
20.0000 mg | ORAL_TABLET | Freq: Every day | ORAL | Status: DC
  Administered 2011-11-11: 20 mg via ORAL
  Filled 2011-11-10 (×4): qty 1

## 2011-11-10 MED ORDER — ZOLPIDEM TARTRATE 5 MG PO TABS
5.0000 mg | ORAL_TABLET | Freq: Every evening | ORAL | Status: DC | PRN
  Administered 2011-11-11: 5 mg via ORAL
  Filled 2011-11-10: qty 1

## 2011-11-10 MED ORDER — LORAZEPAM 1 MG PO TABS
1.0000 mg | ORAL_TABLET | Freq: Three times a day (TID) | ORAL | Status: DC | PRN
  Filled 2011-11-10 (×2): qty 1

## 2011-11-10 MED ORDER — LORAZEPAM 1 MG PO TABS: 0.0000 mg | ORAL_TABLET | Freq: Two times a day (BID) | ORAL | Status: DC

## 2011-11-10 MED ORDER — LORAZEPAM 2 MG/ML IJ SOLN: 1.0000 mg | Freq: Four times a day (QID) | INTRAMUSCULAR | Status: DC | PRN

## 2011-11-10 MED ORDER — ALUM & MAG HYDROXIDE-SIMETH 200-200-20 MG/5ML PO SUSP: 30.0000 mL | ORAL | Status: DC | PRN

## 2011-11-10 NOTE — ED Notes (Signed)
No answer

## 2011-11-10 NOTE — ED Notes (Signed)
tellpsych placed in pt room & plugged into wall. Pt intrusted on interview to be done.

## 2011-11-10 NOTE — ED Provider Notes (Signed)
History     CSN: [id number]  Arrival date & time 11/10/11  1750   First MD Initiated Contact with Patient 11/10/11 1918      Chief Complaint  Patient presents with  . V70.1    HPI Pt was seen at 2000.   Per pt, c/o gradual onset and worsening of persistent depression and SI for the past 1 month, worse over the past 1 week.  Pt states her plan is to "take a bunch of pills" or "cut my wrists."  Pt further endorses hx of etoh abuse with LD today.  Pt was eval in ED 9 days ago for "chewing on a fentanyl patch" which pt states she was "just trying to get high" and that it was not a SA.  Denies HI.      Past Medical History  Diagnosis Date  . Anxiety   . Bipolar 1 disorder   . Depression   . Liver disease   . Diabetes mellitus     History reviewed. No pertinent past surgical history.   History  Substance Use Topics  . Smoking status: Current Every Day Smoker -- 1.0 packs/day for 15 years    Types: Cigarettes  . Smokeless tobacco: Not on file  . Alcohol Use: 3.6 oz/week    6 Cans of beer per week     occasionally    Review of Systems ROS: Statement: All systems negative except as marked or noted in the HPI; Constitutional: Negative for fever and chills. ; ; Eyes: Negative for eye pain, redness and discharge. ; ; ENMT: Negative for ear pain, hoarseness, nasal congestion, sinus pressure and sore throat. ; ; Cardiovascular: Negative for chest pain, palpitations, diaphoresis, dyspnea and peripheral edema. ; ; Respiratory: Negative for cough, wheezing and stridor. ; ; Gastrointestinal: Negative for nausea, vomiting, diarrhea, abdominal pain, blood in stool, hematemesis, jaundice and rectal bleeding. . ; ; Genitourinary: Negative for dysuria, flank pain and hematuria. ; ; Musculoskeletal: Negative for back pain and neck pain. Negative for swelling and trauma.; ; Skin: Negative for pruritus, rash, abrasions, blisters, bruising and skin lesion.; ; Neuro: Negative for headache,  lightheadedness and neck stiffness. Negative for weakness, altered level of consciousness , altered mental status, extremity weakness, paresthesias, involuntary movement, seizure and syncope.; Psych:  +depression, SI, no SA, no HI, no hallucinations.     Allergies  Codeine  Home Medications   Current Outpatient Rx  Name Route Sig Dispense Refill  . METFORMIN HCL 500 MG PO TABS Oral Take 500 mg by mouth daily.    . TRAZODONE HCL 100 MG PO TABS Oral Take 100 mg by mouth at bedtime as needed. Sleep    . ALBUTEROL SULFATE HFA 108 (90 BASE) MCG/ACT IN AERS Inhalation Inhale 2 puffs into the lungs every 6 (six) hours as needed. Shortness of Breath/COPD    . DIAZEPAM 5 MG PO TABS Oral Take 5 mg by mouth 3 (three) times daily.        BP 106/73  Pulse 94  Temp 98.1 F (36.7 C) (Oral)  Resp 18  Ht 5' 3.5" (1.613 m)  Wt 194 lb (87.998 kg)  BMI 33.83 kg/m2  SpO2 98%  LMP 10/27/2011  Physical Exam 2005: Physical examination:  Nursing notes reviewed; Vital signs and O2 SAT reviewed;  Constitutional: Well developed, Well nourished, Well hydrated, In no acute distress; Head:  Normocephalic, atraumatic; Eyes: EOMI, PERRL, No scleral icterus; ENMT: Mouth and pharynx normal, Mucous membranes moist; Neck: Supple, Full range of  motion, No lymphadenopathy; Cardiovascular: Regular rate and rhythm, No murmur, rub, or gallop; Respiratory: Breath sounds clear & equal bilaterally, No rales, rhonchi, wheezes.  Speaking full sentences with ease, Normal respiratory effort/excursion; Chest: Nontender, Movement normal;; Extremities: Pulses normal, No tenderness, No edema, No calf edema or asymmetry.; Neuro: AA&Ox3, Major CN grossly intact.  Speech clear. No gross focal motor or sensory deficits in extremities.; Skin: Color normal, Warm, Dry.; Psych:  Affect flat, poor eye contact, +SI.    ED Course  Procedures    MDM  MDM Reviewed: nursing note, vitals and previous chart Interpretation: labs   Results for  orders placed during the hospital encounter of 11/10/11  ETHANOL      Component Value Range   Alcohol, Ethyl (B) 141 (*) 0 - 11 mg/dL  URINE RAPID DRUG SCREEN (HOSP PERFORMED)      Component Value Range   Opiates NONE DETECTED  NONE DETECTED   Cocaine NONE DETECTED  NONE DETECTED   Benzodiazepines NONE DETECTED  NONE DETECTED   Amphetamines NONE DETECTED  NONE DETECTED   Tetrahydrocannabinol NONE DETECTED  NONE DETECTED   Barbiturates NONE DETECTED  NONE DETECTED  PREGNANCY, URINE      Component Value Range   Preg Test, Ur NEGATIVE  NEGATIVE  BASIC METABOLIC PANEL      Component Value Range   Sodium 135  135 - 145 mEq/L   Potassium 3.5  3.5 - 5.1 mEq/L   Chloride 97  96 - 112 mEq/L   CO2 24  19 - 32 mEq/L   Glucose, Bld 122 (*) 70 - 99 mg/dL   BUN 7  6 - 23 mg/dL   Creatinine, Ser 4.54  0.50 - 1.10 mg/dL   Calcium 9.7  8.4 - 09.8 mg/dL   GFR calc non Af Amer >90  >90 mL/min   GFR calc Af Amer >90  >90 mL/min  CBC WITH DIFFERENTIAL      Component Value Range   WBC 8.4  4.0 - 10.5 K/uL   RBC 4.33  3.87 - 5.11 MIL/uL   Hemoglobin 12.4  12.0 - 15.0 g/dL   HCT 11.9  14.7 - 82.9 %   MCV 87.5  78.0 - 100.0 fL   MCH 28.6  26.0 - 34.0 pg   MCHC 32.7  30.0 - 36.0 g/dL   RDW 56.2  13.0 - 86.5 %   Platelets 307  150 - 400 K/uL   Neutrophils Relative 56  43 - 77 %   Neutro Abs 4.7  1.7 - 7.7 K/uL   Lymphocytes Relative 37  12 - 46 %   Lymphs Abs 3.2  0.7 - 4.0 K/uL   Monocytes Relative 5  3 - 12 %   Monocytes Absolute 0.5  0.1 - 1.0 K/uL   Eosinophils Relative 1  0 - 5 %   Eosinophils Absolute 0.1  0.0 - 0.7 K/uL   Basophils Relative 0  0 - 1 %   Basophils Absolute 0.0  0.0 - 0.1 K/uL     2030:  Will have Telepsych eval.  2250:  Telepsych MD Horwath has eval: recommends psychiatric admission as well as to start celexa 20mg  PO daily.  He also recommends if pt tries to leave she will need IVC paperwork completed.  Holding orders written.            Laray Anger,  DO 11/11/11 [phone]

## 2011-11-10 NOTE — ED Notes (Signed)
Pt says suicidal for 1 month,thinking about taking "a bunch of pills"

## 2011-11-11 ENCOUNTER — Inpatient Hospital Stay (HOSPITAL_COMMUNITY)
Admission: AD | Admit: 2011-11-11 | Discharge: 2011-11-16 | DRG: 897 | Disposition: A | Discharge status: Home / Self Care | Payer: 59 | Source: Ambulatory Visit | Attending: Psychiatry | Admitting: Psychiatry

## 2011-11-11 DIAGNOSIS — F419 Anxiety disorder, unspecified: Secondary | ICD-10-CM | POA: Diagnosis present

## 2011-11-11 DIAGNOSIS — F411 Generalized anxiety disorder: Secondary | ICD-10-CM | POA: Diagnosis present

## 2011-11-11 DIAGNOSIS — F1920 Other psychoactive substance dependence, uncomplicated: Principal | ICD-10-CM | POA: Diagnosis present

## 2011-11-11 DIAGNOSIS — Z79899 Other long term (current) drug therapy: Secondary | ICD-10-CM

## 2011-11-11 DIAGNOSIS — F3289 Other specified depressive episodes: Secondary | ICD-10-CM | POA: Diagnosis present

## 2011-11-11 DIAGNOSIS — R209 Unspecified disturbances of skin sensation: Secondary | ICD-10-CM | POA: Diagnosis present

## 2011-11-11 DIAGNOSIS — F1120 Opioid dependence, uncomplicated: Secondary | ICD-10-CM

## 2011-11-11 DIAGNOSIS — E119 Type 2 diabetes mellitus without complications: Secondary | ICD-10-CM | POA: Diagnosis present

## 2011-11-11 DIAGNOSIS — F329 Major depressive disorder, single episode, unspecified: Secondary | ICD-10-CM | POA: Diagnosis present

## 2011-11-11 DIAGNOSIS — F32A Depression, unspecified: Secondary | ICD-10-CM | POA: Diagnosis present

## 2011-11-11 LAB — GLUCOSE, CAPILLARY: Glucose-Capillary: 209 mg/dL — ABNORMAL HIGH (ref 70–99)

## 2011-11-11 MED ORDER — LORAZEPAM 1 MG PO TABS: 2.0000 mg | ORAL_TABLET | Freq: Once | ORAL | Status: DC

## 2011-11-11 MED ORDER — CHLORDIAZEPOXIDE HCL 25 MG PO CAPS: 25.0000 mg | ORAL_CAPSULE | Freq: Four times a day (QID) | ORAL | Status: AC | PRN

## 2011-11-11 MED ORDER — ALUM & MAG HYDROXIDE-SIMETH 200-200-20 MG/5ML PO SUSP: 30.0000 mL | ORAL | Status: DC | PRN

## 2011-11-11 MED ORDER — LORAZEPAM 1 MG PO TABS
1.0000 mg | ORAL_TABLET | Freq: Once | ORAL | Status: AC
  Administered 2011-11-11: 1 mg via ORAL

## 2011-11-11 MED ORDER — VITAMIN B-1 100 MG PO TABS
100.0000 mg | ORAL_TABLET | Freq: Every day | ORAL | Status: DC
  Administered 2011-11-13 – 2011-11-16 (×4): 100 mg via ORAL
  Filled 2011-11-11 (×7): qty 1

## 2011-11-11 MED ORDER — THIAMINE HCL 100 MG/ML IJ SOLN: 100.0000 mg | Freq: Once | INTRAMUSCULAR | Status: DC

## 2011-11-11 MED ORDER — METFORMIN HCL 500 MG PO TABS: 500.0000 mg | ORAL_TABLET | Freq: Every day | ORAL | Status: DC

## 2011-11-11 MED ORDER — CHLORDIAZEPOXIDE HCL 25 MG PO CAPS
25.0000 mg | ORAL_CAPSULE | Freq: Once | ORAL | Status: AC
  Administered 2011-11-11: 25 mg via ORAL

## 2011-11-11 MED ORDER — ADULT MULTIVITAMIN W/MINERALS CH
1.0000 | ORAL_TABLET | Freq: Every day | ORAL | Status: DC
  Administered 2011-11-13 – 2011-11-16 (×4): 1 via ORAL
  Filled 2011-11-11 (×8): qty 1

## 2011-11-11 MED ORDER — RISPERIDONE 1 MG PO TABS
2.0000 mg | ORAL_TABLET | Freq: Every day | ORAL | Status: DC
Start: 2011-11-11 — End: 2011-11-11
  Filled 2011-11-11 (×2): qty 2

## 2011-11-11 MED ORDER — LOPERAMIDE HCL 2 MG PO CAPS: 2.0000 mg | ORAL_CAPSULE | ORAL | Status: AC | PRN

## 2011-11-11 MED ORDER — RISPERIDONE 1 MG PO TBDP
2.0000 mg | ORAL_TABLET | Freq: Every day | ORAL | Status: DC
  Administered 2011-11-11: 2 mg via ORAL
  Filled 2011-11-11 (×2): qty 2

## 2011-11-11 MED ORDER — CHLORDIAZEPOXIDE HCL 25 MG PO CAPS
25.0000 mg | ORAL_CAPSULE | Freq: Every day | ORAL | Status: AC
  Administered 2011-11-15: 25 mg via ORAL
  Filled 2011-11-11: qty 1

## 2011-11-11 MED ORDER — MAGNESIUM HYDROXIDE 400 MG/5ML PO SUSP: 30.0000 mL | Freq: Every day | ORAL | Status: DC | PRN

## 2011-11-11 MED ORDER — ONDANSETRON 4 MG PO TBDP: 4.0000 mg | ORAL_TABLET | Freq: Four times a day (QID) | ORAL | Status: AC | PRN

## 2011-11-11 MED ORDER — LORAZEPAM 1 MG PO TABS: 1.0000 mg | ORAL_TABLET | Freq: Once | ORAL | Status: DC

## 2011-11-11 MED ORDER — CHLORDIAZEPOXIDE HCL 25 MG PO CAPS
25.0000 mg | ORAL_CAPSULE | Freq: Four times a day (QID) | ORAL | Status: AC
  Administered 2011-11-11 – 2011-11-12 (×3): 25 mg via ORAL
  Filled 2011-11-11 (×4): qty 1

## 2011-11-11 MED ORDER — TRAZODONE HCL 50 MG PO TABS
50.0000 mg | ORAL_TABLET | Freq: Every evening | ORAL | Status: DC | PRN
  Administered 2011-11-11 – 2011-11-15 (×4): 50 mg via ORAL
  Filled 2011-11-11 (×15): qty 1

## 2011-11-11 MED ORDER — HYDROXYZINE HCL 25 MG PO TABS
25.0000 mg | ORAL_TABLET | Freq: Four times a day (QID) | ORAL | Status: AC | PRN
  Administered 2011-11-12 – 2011-11-14 (×5): 25 mg via ORAL
  Filled 2011-11-11: qty 1

## 2011-11-11 MED ORDER — METFORMIN HCL 500 MG PO TABS
500.0000 mg | ORAL_TABLET | Freq: Once | ORAL | Status: AC
  Administered 2011-11-11: 500 mg via ORAL
  Filled 2011-11-11: qty 1

## 2011-11-11 MED ORDER — CHLORDIAZEPOXIDE HCL 25 MG PO CAPS
25.0000 mg | ORAL_CAPSULE | Freq: Three times a day (TID) | ORAL | Status: AC
  Administered 2011-11-13 (×3): 25 mg via ORAL
  Filled 2011-11-11 (×3): qty 1

## 2011-11-11 MED ORDER — CHLORDIAZEPOXIDE HCL 25 MG PO CAPS
25.0000 mg | ORAL_CAPSULE | ORAL | Status: AC
  Administered 2011-11-14 (×2): 25 mg via ORAL
  Filled 2011-11-11 (×2): qty 1

## 2011-11-11 MED ORDER — ACETAMINOPHEN 325 MG PO TABS: 650.0000 mg | ORAL_TABLET | Freq: Four times a day (QID) | ORAL | Status: DC | PRN

## 2011-11-11 MED ORDER — NICOTINE 21 MG/24HR TD PT24
21.0000 mg | MEDICATED_PATCH | Freq: Every day | TRANSDERMAL | Status: DC
  Administered 2011-11-12 – 2011-11-16 (×4): 21 mg via TRANSDERMAL
  Filled 2011-11-11 (×8): qty 1

## 2011-11-11 NOTE — ED Notes (Signed)
Pt up to restroom. Escorted by Comptroller. Returned to the room w/ no complications.

## 2011-11-11 NOTE — Progress Notes (Signed)
Patient resting quietly with eyes closed. Respirations even and unlabored. No distress noted. Q 15 minute check continues to maintain safety   

## 2011-11-11 NOTE — [facility] Counselor (Signed)
The patient was accepted by Dr Dub Mikes to his service, as a voluntary admission. Support paper work completed. [facility] and spoke with Broadus John (3:33 pm), no one available to complete sponsorship. Broadus John will have someone call to complete. The patient has been assigned to room 300-02. Will be transported by [facility].
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
## 2011-11-11 NOTE — [facility] Counselor (Signed)
Julie Burnett. From [facility] called to complete sponsorship. Authorized 3 days with review on 11/13/2011. Authorization# [id number].

## 2011-11-11 NOTE — Progress Notes (Signed)
Psychoeducational Group Note  Date:  11/11/2011 Time:  2000  Group Topic/Focus:  AA group  Participation Level: Did Not Attend  Participation Quality:  Not Applicable  Affect:  Not Applicable  Cognitive:  Not Applicable  Insight:  Not Applicable  Engagement in Group: Not Applicable  Additional Comments:  Pt stated that she was tired and did not feel up to going to group this evening.  Kaleen Odea R 11/11/2011, 10:15 PM

## 2011-11-11 NOTE — Tx Team (Signed)
Initial Interdisciplinary Treatment Plan  PATIENT STRENGTHS: (choose at least two) Ability for insight General fund of knowledge Physical Health  PATIENT STRESSORS: Financial difficulties Substance abuse   PROBLEM LIST: Problem List/Patient Goals Date to be addressed Date deferred Reason deferred Estimated date of resolution  Substance abuse 11/11/11      SI  11/11/11                                                DISCHARGE CRITERIA:  Ability to meet basic life and health needs Adequate post-discharge living arrangements Improved stabilization in mood, thinking, and/or behavior Motivation to continue treatment in a less acute level of care Need for constant or close observation no longer present Reduction of life-threatening or endangering symptoms to within safe limits Safe-care adequate arrangements made Verbal commitment to aftercare and medication compliance Withdrawal symptoms are absent or subacute and managed without 24-hour nursing intervention  PRELIMINARY DISCHARGE PLAN: Outpatient therapy Placement in alternative living arrangements  PATIENT/FAMIILY INVOLVEMENT: This treatment  The patient and family have been given the opportunity to ask questions and make suggestions.  Beatrix Shipper 11/11/2011, 6:11 PM

## 2011-11-11 NOTE — BH Assessment (Addendum)
Assessment Note   Julie Burnett is an 41 y.o. female. The patient came to the ED last night with complaints of suicidal ideation and substance abuse. She has become increasingly depressed over the last month with increased suicidal thoughts. She has a plan to overdose or cut herself. She has not been taking her medications for at least a month. She is drinking beer  and taking pills daily . She is not hallucinated nor delusional. She is not homicidal  nor does she have a history of violence. She is with drawing with symptoms of tremors, fullness in her head, sweats and chills and neausea .  She cannot contract for safety.   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 Anxiety   . Bipolar 1 disorder   . Depression   . Liver disease   . Diabetes mellitus    Axis IV: housing problems, occupational problems, problems related to social environment, problems with access to health care services and problems with primary support group Axis V: 21-30 behavior considerably influenced by delusions or hallucinations OR serious impairment in judgment, communication OR inability to function in almost all areasThe patient came to the ED with complaints of suii  Past Medical History:  Past Medical History  Diagnosis Date  . Anxiety   . Bipolar 1 disorder   . Depression   . Liver disease   . Diabetes mellitus     History reviewed. No pertinent past surgical history.  Family History: History reviewed. No pertinent family history.  Social History:  reports that she has been smoking Cigarettes.  She has a 15 pack-year smoking history. She does not have any smokeless tobacco history on file. She reports that she drinks about 3.6 ounces of alcohol per week. She reports that she does not use illicit drugs.  Additional Social History:  Alcohol / Drug Use Pain Medications:  yes Prescriptions: no Over the Counter: no History of alcohol / drug use?: Yes Longest period of sobriety (when/how long): 30 days Negative Consequences of Use: Financial;Personal relationships Withdrawal Symptoms: Agitation;Fever / Chills;Diarrhea;Sweats;Patient aware of relationship between substance abuse and physical/medical complications;Nausea / Vomiting;Tremors Substance #1 Name of Substance 1: alcohol-beer 1 - Age of First Use: teenage 1 - Amount (size/oz): 12 pack of beer plus 1 - Frequency: daily 1 - Duration: unknown 1 - Last Use / Amount: 11/10/2011 Substance #2 Name of Substance 2: opiates 2 - Age of First Use: 21 2 - Amount (size/oz): 3-4 daily--mg depends on what she can buy 2 - Frequency: daily 2 - Duration: 3-4 weeks 2 - Last Use / Amount: 11/10/2011 Substance #3 Name of Substance 3: xanax 3 - Age of First Use: 20 3 - Amount (size/oz): 3-4 1 mg  3 - Frequency: daily 3 - Duration: 1 week 3 - Last Use / Amount: 1029/2013  CIWA: CIWA-Ar BP: 98/60 mmHg Pulse Rate: 68  Nausea and Vomiting: 2 Tactile Disturbances: very mild itching, pins and needles, burning or numbness Tremor: three Auditory Disturbances: not present Paroxysmal Sweats: two Visual Disturbances: very mild sensitivity Anxiety: three Headache, Fullness in Head: very mild Agitation: two Orientation and Clouding of Sensorium: oriented and can do serial additions CIWA-Ar Total: 15  COWS: Clinical Opiate Withdrawal Scale (COWS) Resting Pulse Rate: Pulse Rate 80 or below Sweating: Subjective report of chills or flushing Restlessness: Frequent shifting or extraneous movements of legs/arms Pupil Size: Pupils possibly larger  than normal for room light Bone or Joint Aches: Mild diffuse discomfort Runny Nose or Tearing: Not present GI Upset: nausea or loose stool Tremor: Slight tremor observable Yawning: No yawning Anxiety or Irritability: Patient obviously irritable/anxious Gooseflesh Skin: Skin  is smooth COWS Total Score: 12   Allergies:  Allergies  Allergen Reactions  . Codeine Itching and Rash    Home Medications:  (Not in a hospital admission)  OB/GYN Status:  Patient's last menstrual period was 10/27/2011.  General Assessment Data Location of Assessment: [facility] ACT Assessment: Yes Living Arrangements: Non-relatives/Friends (living with who ever she can ) Can pt return to current living arrangement?: Yes Admission Status: Voluntary Is patient capable of signing voluntary admission?: Yes Transfer from: Acute Hospital Referral Source: MD  Education Status Is patient currently in school?: No  Risk to self Suicidal Ideation: Yes-Currently Present Suicidal Intent: Yes-Currently Present Is patient at risk for suicide?: Yes Suicidal Plan?: Yes-Currently Present Specify Current Suicidal Plan: plans to overdose or cut herself Access to Means: Yes Specify Access to Suicidal Means: has access to pills and sharps What has been your use of drugs/alcohol within the last 12 months?: daily alcohol, opiates, benzos Previous Attempts/Gestures: Yes How many times?: 1  Other Self Harm Risks: history of cutting at an early age Triggers for Past Attempts: Other (Comment) (depression and SA) Intentional Self Injurious Behavior: Cutting (at an early age , not current) Family Suicide History: No Recent stressful life event(s): Financial Problems;Recent negative physical changes;Loss (Comment) (no stable plaxce to live; no family support) Persecutory voices/beliefs?: No Depression: Yes Depression Symptoms: Insomnia;Isolating;Fatigue;Loss of interest in usual pleasures;Feeling worthless/self pity Substance abuse history and/or treatment for substance abuse?: Yes Suicide prevention information given to non-admitted patients: Not applicable  Risk to Others Homicidal Ideation: No Thoughts of Harm to Others: No Current Homicidal Intent: No Current Homicidal Plan: No Access to  Homicidal Means: No History of harm to others?: No Assessment of Violence: None Noted Does patient have access to weapons?: No Criminal Charges Pending?: No Does patient have a court date: No  Psychosis Hallucinations: None noted Delusions: None noted  Mental Status Report Appear/Hygiene: Disheveled Eye Contact: Fair Motor Activity: Freedom of movement;Restlessness;Tremors Speech: Logical/coherent Level of Consciousness: Alert;Restless Mood: Depressed;Anxious;Anhedonia;Irritable;Worthless, low self-esteem Affect: Anxious;Depressed;Irritable Anxiety Level: Moderate Thought Processes: Coherent;Relevant Judgement: Unimpaired Orientation: Person;Place;Time;Situation Obsessive Compulsive Thoughts/Behaviors: None  Cognitive Functioning Concentration: Decreased Memory: Recent Intact;Remote Intact IQ: Average Insight: Poor Impulse Control: Poor Appetite: Good Weight Loss: 0  Weight Gain: 0  Sleep: No Change Vegetative Symptoms: None  ADLScreening [facility] Assessment Services) Patient's cognitive ability adequate to safely complete daily activities?: Yes Patient able to express need for assistance with ADLs?: Yes Independently performs ADLs?: Yes (appropriate for developmental age)  Abuse/Neglect [facility]) Physical Abuse: Denies Verbal Abuse: Denies Sexual Abuse: Denies  Prior Inpatient Therapy Prior Inpatient Therapy: Yes Prior Therapy Dates: 2013 Prior Therapy Facilty[facility]Provider[facility] Reason for Treatment: depression;SA  Prior Outpatient Therapy Prior Outpatient Therapy: Yes Prior Therapy Dates: 2013 Prior Therapy Facilty/Provider(s): [facility] Reason for Treatment: medications  ADL Screening (condition at time of admission) Patient's cognitive ability adequate to safely complete daily activities?: Yes Patient able to express need for assistance with ADLs?: Yes Independently performs ADLs?: Yes (appropriate for developmental age)         Abuse/Neglect Assessment (Assessment to be complete while patient is alone) Physical Abuse: Denies Verbal Abuse: Denies Sexual Abuse: Denies Values / Beliefs Cultural Requests During Hospitalization: None Spiritual Requests During Hospitalization: None  Additional Information 1:1 In Past 12 Months?: No CIRT Risk: No Elopement Risk: No Does patient have medical clearance?: Yes     Disposition: Referred to [facility].patient has been accepted to [facility] as a voluntary admission by Dr Dub Mikes. She will be transported by [facility]. Dr Rhunette Croft is in agreement with the disposition. Disposition Disposition of Patient: Inpatient treatment program Type of inpatient treatment program: Adult  On Site Evaluation by:   Reviewed with Physician:     Jearld Pies 11/11/2011 9:03 AM

## 2011-11-11 NOTE — ED Notes (Signed)
Patient requesting home medications-metformin and risperdal, EDP made aware.

## 2011-11-11 NOTE — ED Notes (Signed)
Pharmacy called for ordered Celexa.

## 2011-11-11 NOTE — ED Provider Notes (Signed)
0700 Patient with worsening depression and SI. Seen by tele psych. Recommended admission for treatment. Patient cleared medically for placement. ACT to see today for placement. No behavioral issues overnight.  Nicoletta Dress. Colon Branch, MD 11/11/11 [phone]

## 2011-11-11 NOTE — Progress Notes (Signed)
Patient ID: Julie Burnett, female   DOB: January 26, 1970, 41 y.o.   MRN: [id number] Patient has remained in the bed for most of the evening.  Passive SI and contracts for safety with staff. AH at times.  Compliant with HS medications.

## 2011-11-12 ENCOUNTER — Encounter (HOSPITAL_COMMUNITY): Payer: Self-pay | Admitting: Psychiatry

## 2011-11-12 DIAGNOSIS — F1920 Other psychoactive substance dependence, uncomplicated: Secondary | ICD-10-CM | POA: Diagnosis present

## 2011-11-12 DIAGNOSIS — F411 Generalized anxiety disorder: Secondary | ICD-10-CM

## 2011-11-12 DIAGNOSIS — F419 Anxiety disorder, unspecified: Secondary | ICD-10-CM | POA: Diagnosis present

## 2011-11-12 DIAGNOSIS — F1910 Other psychoactive substance abuse, uncomplicated: Secondary | ICD-10-CM

## 2011-11-12 DIAGNOSIS — F1010 Alcohol abuse, uncomplicated: Secondary | ICD-10-CM

## 2011-11-12 DIAGNOSIS — F329 Major depressive disorder, single episode, unspecified: Secondary | ICD-10-CM

## 2011-11-12 DIAGNOSIS — E119 Type 2 diabetes mellitus without complications: Secondary | ICD-10-CM | POA: Diagnosis present

## 2011-11-12 DIAGNOSIS — F1120 Opioid dependence, uncomplicated: Secondary | ICD-10-CM | POA: Diagnosis present

## 2011-11-12 LAB — T4, FREE: Free T4: 1.18 ng/dL (ref 0.80–1.80)

## 2011-11-12 LAB — GLUCOSE, CAPILLARY: Glucose-Capillary: 112 mg/dL — ABNORMAL HIGH (ref 70–99)

## 2011-11-12 LAB — TSH: TSH: 3.434 u[IU]/mL (ref 0.350–4.500)

## 2011-11-12 MED ORDER — METFORMIN HCL 500 MG PO TABS
500.0000 mg | ORAL_TABLET | Freq: Every day | ORAL | Status: DC
  Administered 2011-11-12 – 2011-11-16 (×5): 500 mg via ORAL
  Filled 2011-11-12 (×5): qty 1
  Filled 2011-11-12: qty 14
  Filled 2011-11-12 (×3): qty 1

## 2011-11-12 MED ORDER — CITALOPRAM HYDROBROMIDE 20 MG PO TABS
20.0000 mg | ORAL_TABLET | Freq: Every day | ORAL | Status: DC
  Administered 2011-11-12 – 2011-11-16 (×5): 20 mg via ORAL
  Filled 2011-11-12 (×7): qty 1
  Filled 2011-11-12: qty 14
  Filled 2011-11-12: qty 1

## 2011-11-12 MED ORDER — ALBUTEROL SULFATE HFA 108 (90 BASE) MCG/ACT IN AERS: 2.0000 | INHALATION_SPRAY | Freq: Four times a day (QID) | RESPIRATORY_TRACT | Status: DC | PRN

## 2011-11-12 MED ORDER — MIRTAZAPINE 30 MG PO TABS
30.0000 mg | ORAL_TABLET | Freq: Every day | ORAL | Status: DC
  Administered 2011-11-12: 30 mg via ORAL
  Filled 2011-11-12 (×3): qty 1

## 2011-11-12 MED ORDER — TRAZODONE HCL 100 MG PO TABS
100.0000 mg | ORAL_TABLET | Freq: Every evening | ORAL | Status: DC | PRN
  Administered 2011-11-12 – 2011-11-14 (×2): 100 mg via ORAL
  Filled 2011-11-12: qty 14

## 2011-11-12 NOTE — Social Work (Addendum)
Aftercare Planning Group: 11/12/2011 9:45 AM  Pt did not attend d/c planning group on this date.  SW met with pt individually at this time.  Pt states that she came to the hospital because of drug use and depression.  Pt was tearful and states that she is tired of living this way and wants to live a normal life.  Pt states that she has been homeless for the past 2-3 years in [place].  Pt states that she's been to numerous treatment centers in the past.  Pt wants to go to a halfway house upon d/c.  CSW will assess for appropriate referrals.  No further needs voiced by pt at this time.      [facility] Group Note : Clinical Social Worker Group Therapy  11/12/2011  1:15 PM  Type of Therapy:  Group Therapy  Participation Level:  Did Not Attend   Reyes Ivan, LCSWA 11/12/2011 1:56 PM
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
## 2011-11-12 NOTE — Progress Notes (Signed)
D: Pt,s mood has improved slightly. Pt is up & about & did attend karaoki.CBG @ 1700= 112.Affect remains blunted & mood still sad. Pt claims that she has auditory hallucinations that are command in nature.A: Supported & encouraged. Continues on 15 minute checks.R: Pt. Safety maintained.
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
## 2011-11-12 NOTE — Progress Notes (Signed)
Patient did attend the evening karaoke group.  

## 2011-11-12 NOTE — Progress Notes (Signed)
D:  Patient has laid in her bed with the blanket over her head most of the day.  She has not participated in any groups today.  She did not come to med call this morning, but did get medicines at noon.  She rates her depression and hopelessness both at an 8 on a scale of 1-10.  She denies suicidal thoughts and states she is feeling agitated.  A:  Encouraged patient to get out of bed and interact in the milieu.  Reminded patient that attending groups and taking medication is all part of the recovery process.   R:  Not interacting today.  Need to continue to encourage patient to participate in groups.
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
## 2011-11-13 LAB — GLUCOSE, CAPILLARY
Glucose-Capillary: 149 mg/dL — ABNORMAL HIGH (ref 70–99)
Glucose-Capillary: 129 mg/dL — ABNORMAL HIGH (ref 70–99)
Glucose-Capillary: 123 mg/dL — ABNORMAL HIGH (ref 70–99)

## 2011-11-13 MED ORDER — MIRTAZAPINE 15 MG PO TABS
15.0000 mg | ORAL_TABLET | Freq: Two times a day (BID) | ORAL | Status: DC
  Administered 2011-11-13 – 2011-11-14 (×2): 15 mg via ORAL
  Filled 2011-11-13 (×7): qty 1

## 2011-11-13 NOTE — Progress Notes (Signed)
[facility] Adult Inpatient Family/Significant Other Suicide Prevention Education  Suicide Prevention Education:  Education Completed; Julie Burnett - daughter [phone]),  (name of family member/significant other) has been identified by the patient as the family member/significant other with whom the patient will be residing, and identified as the person(s) who will aid the patient in the event of a mental health crisis (suicidal ideations/suicide attempt).  With written consent from the patient, the family member/significant other has been provided the following suicide prevention education, prior to the and/or following the discharge of the patient.  The suicide prevention education provided includes the following:  Suicide risk factors  Suicide prevention and interventions  National Suicide Hotline telephone number  [facility] assessment telephone number  [facility] Emergency Assistance 911  [facility] and/or Residential Mobile Crisis Unit telephone number  Request made of family/significant other to:  Remove weapons (e.g., guns, rifles, knives), all items previously/currently identified as safety concern.    Remove drugs/medications (over-the-counter, prescriptions, illicit drugs), all items previously/currently identified as a safety concern.  The family member/significant other verbalizes understanding of the suicide prevention education information provided.  The family member/significant other agrees to remove the items of safety concern listed above.  Julie Burnett 11/13/2011, 3:31 PM

## 2011-11-13 NOTE — Tx Team (Signed)
Interdisciplinary Treatment Plan Update (Adult)  Date:  11/13/2011  Time Reviewed:  9:49 AM   Progress in Treatment: Attending groups: Yes Participating in groups:  Yes Taking medication as prescribed: Yes Tolerating medication:  Yes Family/Significant othe contact made:  CSW is assessing for appropriate contact Patient understands diagnosis:  Yes Discussing patient identified problems/goals with staff:  Yes Medical problems stabilized or resolved:  Yes Denies suicidal/homicidal ideation: Yes Issues/concerns per patient self-inventory:  None identified Other: N/A  New problem(s) identified: None Identified  Reason for Continuation of Hospitalization: Anxiety Depression Medication stabilization Withdrawal symptoms  Interventions implemented related to continuation of hospitalization: mood stabilization, medication monitoring and adjustment, group therapy and psycho education, safety checks q 15 mins  Additional comments: N/A  Estimated length of stay: 3-5 days  Discharge Plan: SW is assessing for appropriate referrals.    New goal(s): N/A  Review of initial/current patient goals per problem list:    1.  Goal(s): Address substance use  Met:  No  Target date: by discharge  As evidenced by: completing detox protocol and refer to appropriate treatment  2.  Goal (s): Reduce depressive and anxiety symptoms  Met:  No  Target date: by discharge  As evidenced by: Reducing depression from a 10 to a 3 as reported by pt.    3.  Goal(s): Eliminate SI  Met:  No  Target date: by discharge  As evidenced by: Pt denying SI   Attendees: Patient:     Family:     Physician: Geoffery Lyons, MD 11/13/2011 9:49 AM   Nursing: Alease Frame, RN 11/13/2011 9:49 AM   Clinical Social Worker:  Reyes Ivan, LCSWA 11/13/2011  9:49 AM   Other: Carroll Sage, RN 11/13/2011  9:49 AM   Other:     Other:     Other:     Other:      Scribe for Treatment Team:   Reyes Ivan 11/13/2011  9:49 AM

## 2011-11-13 NOTE — Progress Notes (Addendum)
D: Patient denies SI/HI and auditory and visual hallucinations. The patient has a depressed mood and a blunted affect. The patient rates her depression and hopelessness both a 5 out of 10 (1 low/10 high). The patient reports sleeping well and states that her appetite is good but that her energy level is low. The patient states that she still feels "tired a lot" and that she "gets shaky." When RN observes patient interacting within the milieu the patient displays no visible tremors and seems elated and vibrant. When interacting with RN the patient displays a depressed mood and blunted affect and has noticeable tremors.  A: Patient given emotional support from RN. Patient encouraged to come to staff with concerns and/or questions. Patient's medication routine continued. Patient's orders and plan of care reviewed.  R: Patient remains appropriate and cooperative. Will continue to monitor patient q15 minutes for safety.

## 2011-11-13 NOTE — Progress Notes (Signed)
[facility] MD Progress Note  11/13/2011 6:52 PM Julie Burnett  MRN:  [id number]  Diagnosis:  Opioid Dependence, Depressive Disorder  ADL's:  Intact  Sleep: Fair  Appetite:  Fair  Suicidal Ideation:  Plan:  Denies Intent:  Denies Means:  Denies Homicidal Ideation:  Plan:  Denies Intent:  Denies Means:  Denies  AEB (as evidenced by):  Mental Status Examination/Evaluation: Objective:  Appearance: Fairly Groomed  Patent attorney::  Fair  Speech:  Clear and Coherent, Slow and Not spontaneous  Volume:  Decreased  Mood:  Anxious and Depressed  Affect:  Restricted  Thought Process:  Coherent and Goal Directed  Orientation:  Full  Thought Content:  Worried about leaving and going back to where she was before. Admits that she will not be able to make it.  Suicidal Thoughts:  No  Homicidal Thoughts:  No  Memory:  Immediate;   Fair Recent;   Fair Remote;   Fair  Judgement:  Fair  Insight:  Fair  Psychomotor Activity:  Decreased  Concentration:  Fair  Recall:  Fair  Akathisia:  No  Handed:  Right  AIMS (if indicated):     Assets:  Desire for Improvement  Sleep:  Number of Hours: 4.5    Vital Signs:Blood pressure 118/82, pulse 97, temperature 98.4 F (36.9 C), temperature source Oral, resp. rate 16, height 5\' 3"  (1.6 m), weight 86.183 kg (190 lb), last menstrual period 10/27/2011. Current Medications: Current Facility-Administered Medications  Medication Dose Route Frequency Provider Last Rate Last Dose  . acetaminophen (TYLENOL) tablet 650 mg  650 mg Oral Q6H PRN Rachael Fee, MD      . albuterol (PROVENTIL HFA;VENTOLIN HFA) 108 (90 BASE) MCG/ACT inhaler 2 puff  2 puff Inhalation Q6H PRN Rachael Fee, MD      . alum & mag hydroxide-simeth (MAALOX/MYLANTA) 200-200-20 MG/5ML suspension 30 mL  30 mL Oral Q4H PRN Rachael Fee, MD      . chlordiazePOXIDE (LIBRIUM) capsule 25 mg  25 mg Oral Q6H PRN Rachael Fee, MD      . chlordiazePOXIDE (LIBRIUM) capsule 25 mg  25 mg Oral QID  Rachael Fee, MD   25 mg at 11/12/11 1710   Followed by  . chlordiazePOXIDE (LIBRIUM) capsule 25 mg  25 mg Oral TID Rachael Fee, MD   25 mg at 11/13/11 1605   Followed by  . chlordiazePOXIDE (LIBRIUM) capsule 25 mg  25 mg Oral BH-qamhs Rachael Fee, MD       Followed by  . chlordiazePOXIDE (LIBRIUM) capsule 25 mg  25 mg Oral Daily Rachael Fee, MD      . citalopram (CELEXA) tablet 20 mg  20 mg Oral Daily Rachael Fee, MD   20 mg at 11/13/11 1110  . hydrOXYzine (ATARAX/VISTARIL) tablet 25 mg  25 mg Oral Q6H PRN Rachael Fee, MD   25 mg at 11/13/11 1140  . loperamide (IMODIUM) capsule 2-4 mg  2-4 mg Oral PRN Rachael Fee, MD      . magnesium hydroxide (MILK OF MAGNESIA) suspension 30 mL  30 mL Oral Daily PRN Rachael Fee, MD      . metFORMIN (GLUCOPHAGE) tablet 500 mg  500 mg Oral Daily Rachael Fee, MD   500 mg at 11/13/11 1110  . mirtazapine (REMERON) tablet 30 mg  30 mg Oral QHS Rachael Fee, MD   30 mg at 11/12/11 2208  . multivitamin with minerals tablet 1 tablet  1 tablet Oral Daily Rachael Fee, MD   1 tablet at 11/13/11 1110  . nicotine (NICODERM CQ - dosed in mg/24 hours) patch 21 mg  21 mg Transdermal Q0600 Rachael Fee, MD   21 mg at 11/12/11 0616  . ondansetron (ZOFRAN-ODT) disintegrating tablet 4 mg  4 mg Oral Q6H PRN Rachael Fee, MD      . thiamine (B-1) injection 100 mg  100 mg Intramuscular Once Rachael Fee, MD      . thiamine (VITAMIN B-1) tablet 100 mg  100 mg Oral Daily Rachael Fee, MD   100 mg at 11/13/11 1111  . traZODone (DESYREL) tablet 100 mg  100 mg Oral QHS PRN Rachael Fee, MD   100 mg at 11/12/11 2209  . traZODone (DESYREL) tablet 50 mg  50 mg Oral QHS,MR X 1 Rachael Fee, MD   50 mg at 11/11/11 2207    Lab Results:  Results for orders placed during the hospital encounter of 11/11/11 (from the past 48 hour(s))  T4, FREE     Status: Normal   Collection Time   11/11/11  8:10 PM      Component Value Range Comment   Free T4 1.18  0.80 - 1.80  ng/dL   HEMOGLOBIN W0J     Status: Abnormal   Collection Time   11/11/11  8:10 PM      Component Value Range Comment   Hemoglobin A1C 6.6 (*) <5.7 %    Mean Plasma Glucose 143 (*) <117 mg/dL   TSH     Status: Normal   Collection Time   11/11/11  8:10 PM      Component Value Range Comment   TSH 3.434  0.350 - 4.500 uIU/mL   GLUCOSE, CAPILLARY     Status: Abnormal   Collection Time   11/12/11  6:01 AM      Component Value Range Comment   Glucose-Capillary 122 (*) 70 - 99 mg/dL   GLUCOSE, CAPILLARY     Status: Abnormal   Collection Time   11/12/11 11:48 AM      Component Value Range Comment   Glucose-Capillary 130 (*) 70 - 99 mg/dL   GLUCOSE, CAPILLARY     Status: Abnormal   Collection Time   11/12/11  5:04 PM      Component Value Range Comment   Glucose-Capillary 112 (*) 70 - 99 mg/dL   GLUCOSE, CAPILLARY     Status: Abnormal   Collection Time   11/13/11  6:00 AM      Component Value Range Comment   Glucose-Capillary 149 (*) 70 - 99 mg/dL   GLUCOSE, CAPILLARY     Status: Abnormal   Collection Time   11/13/11 12:13 PM      Component Value Range Comment   Glucose-Capillary 129 (*) 70 - 99 mg/dL   GLUCOSE, CAPILLARY     Status: Abnormal   Collection Time   11/13/11  5:23 PM      Component Value Range Comment   Glucose-Capillary 123 (*) 70 - 99 mg/dL    Comment 1 Notify RN       Physical Findings: AIMS: Facial and Oral Movements Muscles of Facial Expression: None, normal Lips and Perioral Area: None, normal Jaw: None, normal Tongue: None, normal,Extremity Movements Upper (arms, wrists, hands, fingers): None, normal Lower (legs, knees, ankles, toes): None, normal, Trunk Movements Neck, shoulders, hips: None, normal, Overall Severity Severity of abnormal movements (highest score from questions above):  None, normal Incapacitation due to abnormal movements: None, normal Patient's awareness of abnormal movements (rate only patient's report): No Awareness, Dental  Status Current problems with teeth and/or dentures?: Yes (Pt reports bad teeth) Does patient usually wear dentures?: No  CIWA:  CIWA-Ar Total: 2  COWS:  COWS Total Score: 1   Treatment Plan Summary: Daily contact with patient to assess and evaluate symptoms and progress in treatment Medication management  Plan: Supportive approach/coping skills/relapse prevention           Change the Remeron to 15 BID           Consider increasing the Celexa to 30 mg  Sherisse Fullilove A 11/13/2011, 6:52 PM

## 2011-11-13 NOTE — Progress Notes (Signed)
Psychoeducational Group Note  Date:  11/13/2011 Time:  1100  Group Topic/Focus:  Relapse Prevention Planning:   The focus of this group is to define relapse and discuss the need for planning to combat relapse.  Participation Level:  Did Not Attend  Dalia Heading 11/13/2011, 3:22 PM

## 2011-11-13 NOTE — Social Work (Signed)
Aftercare Planning Group: 11/13/2011 9:45 AM  Pt did not attend d/c planning group on this date.  SW met with pt individually at this time.  Pt states that she hasn't felt like getting up.  Pt rates depression at a 5 and anxiety at a 6 today.  Pt denies SI/HI.  Pt states that she will try to get up and come to groups today.  Pt is interested in [place] [facility]; CSW provided pt with a list of houses for pt to call.  No further needs voiced by pt at this time.    [facility] Group Note : Clinical Social Worker Group Therapy  11/13/2011  1:15 PM  Type of Therapy:  Group Therapy  Participation Level:  Appropriate  Participation Quality:  Appropriate   Affect:  Depressed  Cognitive:  Alert  Insight:  Good  Engagement in Group:  Good  Engagement in Therapy:  Good  Modes of Intervention:  Clarification, Education, Socialization and Support  Summary of Progress/Problems: .The topic for today was feelings about relapse.  Pt discussed what relapse prevention is to them and identified triggers that they are on the path to relapse.  Pt processed their feeling towards relapse and was able to relate to peers.  Pt discussed coping skills that can be used for relapse prevention.   Pt discussed feelings of failure and wanting to get clean and sober so she can live a "normal" life.  Pt states that she is starting to make a change by coming to groups even though she doesn't feel like it.     Reyes Ivan, LCSWA 11/13/2011 9:09 AM

## 2011-11-13 NOTE — Progress Notes (Signed)
D.  Pt pleasant on approach, asked if her Remeron is now scheduled twice daily.  Denied complaints, denies SI/HI/hallucinations at this time.  Interacting appropriately within the milieu.  Positive for evening AA group.  A.  Support and encouragement offered.  R.  Will continue to monitor.

## 2011-11-14 DIAGNOSIS — F332 Major depressive disorder, recurrent severe without psychotic features: Secondary | ICD-10-CM

## 2011-11-14 DIAGNOSIS — F1994 Other psychoactive substance use, unspecified with psychoactive substance-induced mood disorder: Secondary | ICD-10-CM

## 2011-11-14 DIAGNOSIS — F411 Generalized anxiety disorder: Secondary | ICD-10-CM

## 2011-11-14 LAB — GLUCOSE, CAPILLARY
Glucose-Capillary: 125 mg/dL — ABNORMAL HIGH (ref 70–99)
Glucose-Capillary: 120 mg/dL — ABNORMAL HIGH (ref 70–99)

## 2011-11-14 MED ORDER — MIRTAZAPINE 15 MG PO TABS
15.0000 mg | ORAL_TABLET | Freq: Every day | ORAL | Status: DC
  Administered 2011-11-14 – 2011-11-15 (×2): 15 mg via ORAL
  Filled 2011-11-14: qty 14
  Filled 2011-11-14 (×4): qty 1

## 2011-11-14 MED ORDER — GABAPENTIN 100 MG PO CAPS
200.0000 mg | ORAL_CAPSULE | Freq: Three times a day (TID) | ORAL | Status: DC
  Administered 2011-11-14 – 2011-11-16 (×7): 200 mg via ORAL
  Filled 2011-11-14 (×2): qty 2
  Filled 2011-11-14: qty 84
  Filled 2011-11-14 (×3): qty 2
  Filled 2011-11-14: qty 84
  Filled 2011-11-14 (×3): qty 2
  Filled 2011-11-14: qty 84
  Filled 2011-11-14 (×5): qty 2

## 2011-11-14 NOTE — Progress Notes (Signed)
Psychoeducational Group Note  Date:  08/22/2011 Time: 1015  Group Topic/Focus:  Identifying Needs:   The focus of this group is to help patients identify their personal needs that have been historically problematic and identify healthy behaviors to address their needs.  Participation Level:  active Participation Quality: good Affect: flat Cognitive:    Insight:  good  Engagement in Group: engaged  Additional Comments: Pt was engaged in group, asked appropriate questions, shared personal life experience with the group and demonstrates willingness to process and understand her problems. PDuke RN BC   

## 2011-11-14 NOTE — Progress Notes (Signed)
[facility] Group Notes:  (Counselor/Nursing/MHT/Case Management/Adjunct)  11/14/2011 12:20 PM  Type of Therapy:  Group Therapy  Participation Level:  Did Not Attend    Julie Burnett 11/14/2011, 12:20 PM

## 2011-11-14 NOTE — Progress Notes (Signed)
[facility] MD Progress Note  11/14/2011 10:10 AM Julie Burnett  MRN:  [id number]  Diagnosis:   Axis I: Anxiety Disorder NOS, Major Depression, Recurrent severe, Substance Abuse and Substance Induced Mood Disorder Axis II: Deferred Axis III:  Past Medical History  Diagnosis Date  . Anxiety   . Bipolar 1 disorder   . Depression   . Liver disease   . Diabetes mellitus    Subjective: Julie Burnett is lying in bed this morning at 10:00, and reports she did not realize it was so late. She denies any withdrawal symptoms or cravings for any substances today. She complains of some numbness and tingling in her legs and arms. She denies any suicidal or homicidal ideation. She denies any auditory or visual hallucinations. She endorses good appetite and good sleep. She expresses a desire to go to a halfway house after discharge. She is open to substance abuse rehabilitation.  ADL's:  Intact  Sleep: Good  Appetite:  Good  Suicidal Ideation:  Patient denies any thought, plan, or intent Homicidal Ideation:  Patient denies any thought, plan, or intent  AEB (as evidenced by):  Mental Status Examination/Evaluation: Objective:  Appearance: Disheveled  Eye Contact::  Good  Speech:  Clear and Coherent  Volume:  Normal  Mood:  Dysphoric  Affect:  Congruent  Thought Process:  Linear  Orientation:  Full  Thought Content:  WDL  Suicidal Thoughts:  No  Homicidal Thoughts:  No  Memory:  Immediate;   Good Recent;   Good Remote;   Good  Judgement:  Fair  Insight:  Fair  Psychomotor Activity:  Normal  Concentration:  Good  Recall:  Good  Akathisia:  No  Handed:    AIMS (if indicated):     Assets:  Desire for Improvement Resilience  Sleep:  Number of Hours: 5.75    Vital Signs:Blood pressure 105/69, pulse 88, temperature 97.6 F (36.4 C), temperature source Oral, resp. rate 16, height 5\' 3"  (1.6 m), weight 86.183 kg (190 lb), last menstrual period 10/27/2011. Current Medications: Current  Facility-Administered Medications  Medication Dose Route Frequency Provider Last Rate Last Dose  . acetaminophen (TYLENOL) tablet 650 mg  650 mg Oral Q6H PRN Rachael Fee, MD      . albuterol (PROVENTIL HFA;VENTOLIN HFA) 108 (90 BASE) MCG/ACT inhaler 2 puff  2 puff Inhalation Q6H PRN Rachael Fee, MD      . alum & mag hydroxide-simeth (MAALOX/MYLANTA) 200-200-20 MG/5ML suspension 30 mL  30 mL Oral Q4H PRN Rachael Fee, MD      . chlordiazePOXIDE (LIBRIUM) capsule 25 mg  25 mg Oral Q6H PRN Rachael Fee, MD      . chlordiazePOXIDE (LIBRIUM) capsule 25 mg  25 mg Oral TID Rachael Fee, MD   25 mg at 11/13/11 1605   Followed by  . chlordiazePOXIDE (LIBRIUM) capsule 25 mg  25 mg Oral BH-qamhs Rachael Fee, MD   25 mg at 11/14/11 4098   Followed by  . chlordiazePOXIDE (LIBRIUM) capsule 25 mg  25 mg Oral Daily Rachael Fee, MD      . citalopram (CELEXA) tablet 20 mg  20 mg Oral Daily Rachael Fee, MD   20 mg at 11/14/11 [phone]  . gabapentin (NEURONTIN) capsule 200 mg  200 mg Oral TID Jorje Guild, PA-C      . hydrOXYzine (ATARAX/VISTARIL) tablet 25 mg  25 mg Oral Q6H PRN Rachael Fee, MD   25 mg at 11/13/11 1859  .  loperamide (IMODIUM) capsule 2-4 mg  2-4 mg Oral PRN Rachael Fee, MD      . magnesium hydroxide (MILK OF MAGNESIA) suspension 30 mL  30 mL Oral Daily PRN Rachael Fee, MD      . metFORMIN (GLUCOPHAGE) tablet 500 mg  500 mg Oral Daily Rachael Fee, MD   500 mg at 11/14/11 [phone]  . mirtazapine (REMERON) tablet 15 mg  15 mg Oral BID Rachael Fee, MD   15 mg at 11/14/11 0836  . multivitamin with minerals tablet 1 tablet  1 tablet Oral Daily Rachael Fee, MD   1 tablet at 11/14/11 [phone]  . nicotine (NICODERM CQ - dosed in mg/24 hours) patch 21 mg  21 mg Transdermal Q0600 Rachael Fee, MD   21 mg at 11/14/11 0604  . ondansetron (ZOFRAN-ODT) disintegrating tablet 4 mg  4 mg Oral Q6H PRN Rachael Fee, MD      . thiamine (B-1) injection 100 mg  100 mg Intramuscular Once Rachael Fee, MD       . thiamine (VITAMIN B-1) tablet 100 mg  100 mg Oral Daily Rachael Fee, MD   100 mg at 11/14/11 5409  . traZODone (DESYREL) tablet 100 mg  100 mg Oral QHS PRN Rachael Fee, MD   100 mg at 11/12/11 2209  . traZODone (DESYREL) tablet 50 mg  50 mg Oral QHS,MR X 1 Rachael Fee, MD   50 mg at 11/13/11 2300  . DISCONTD: mirtazapine (REMERON) tablet 30 mg  30 mg Oral QHS Rachael Fee, MD   30 mg at 11/12/11 2208    Lab Results:  Results for orders placed during the hospital encounter of 11/11/11 (from the past 48 hour(s))  GLUCOSE, CAPILLARY     Status: Abnormal   Collection Time   11/12/11 11:48 AM      Component Value Range Comment   Glucose-Capillary 130 (*) 70 - 99 mg/dL   GLUCOSE, CAPILLARY     Status: Abnormal   Collection Time   11/12/11  5:04 PM      Component Value Range Comment   Glucose-Capillary 112 (*) 70 - 99 mg/dL   GLUCOSE, CAPILLARY     Status: Abnormal   Collection Time   11/13/11  6:00 AM      Component Value Range Comment   Glucose-Capillary 149 (*) 70 - 99 mg/dL   GLUCOSE, CAPILLARY     Status: Abnormal   Collection Time   11/13/11 12:13 PM      Component Value Range Comment   Glucose-Capillary 129 (*) 70 - 99 mg/dL   GLUCOSE, CAPILLARY     Status: Abnormal   Collection Time   11/13/11  5:23 PM      Component Value Range Comment   Glucose-Capillary 123 (*) 70 - 99 mg/dL    Comment 1 Notify RN     GLUCOSE, CAPILLARY     Status: Abnormal   Collection Time   11/14/11  6:10 AM      Component Value Range Comment   Glucose-Capillary 136 (*) 70 - 99 mg/dL     Physical Findings: AIMS: Facial and Oral Movements Muscles of Facial Expression: None, normal Lips and Perioral Area: None, normal Jaw: None, normal Tongue: None, normal,Extremity Movements Upper (arms, wrists, hands, fingers): None, normal Lower (legs, knees, ankles, toes): None, normal, Trunk Movements Neck, shoulders, hips: None, normal, Overall Severity Severity of abnormal movements (highest score  from questions above): None,  normal Incapacitation due to abnormal movements: None, normal Patient's awareness of abnormal movements (rate only patient's report): No Awareness, Dental Status Current problems with teeth and/or dentures?: Yes (Pt reports bad teeth) Does patient usually wear dentures?: No  CIWA:  CIWA-Ar Total: 3  COWS:  COWS Total Score: 1   Treatment Plan Summary: Daily contact with patient to assess and evaluate symptoms and progress in treatment Medication management  Plan: We will try some Neurontin to target her peripheral neuropathy. We will continue her safe medical detox, and research options for followup treatment. Jolyne Laye 11/14/2011, 10:10 AM

## 2011-11-14 NOTE — Progress Notes (Signed)
Psychoeducational Group Note  Date:  11/14/2011 Time:  1520  Group Topic/Focus:  Living a life of Gratitude  Participation Level:  Did Not Attend  Dalia Heading 11/14/2011, 5:48 PM

## 2011-11-14 NOTE — Progress Notes (Signed)
Patient ID: Julie Burnett, female   DOB: [birth year], 41 y.o.   MRN: [id number] She went bact to bed afet AM meal and would not get up for medication but did take them when she woke. She did not fill out her self inventory sheet. Requested and received prn vistaril for anxiety at 12:54. That was effective.

## 2011-11-15 DIAGNOSIS — F1920 Other psychoactive substance dependence, uncomplicated: Secondary | ICD-10-CM

## 2011-11-15 LAB — GLUCOSE, CAPILLARY: Glucose-Capillary: 152 mg/dL — ABNORMAL HIGH (ref 70–99)

## 2011-11-15 MED ORDER — HYDROXYZINE HCL 50 MG/ML IM SOLN: 50.0000 mg | Freq: Three times a day (TID) | INTRAMUSCULAR | Status: DC | PRN

## 2011-11-15 MED ORDER — HYDROXYZINE HCL 50 MG PO TABS: 50.0000 mg | ORAL_TABLET | Freq: Three times a day (TID) | ORAL | Status: DC | PRN

## 2011-11-15 NOTE — Progress Notes (Deleted)
Psychoeducational Group Note  Date:  11/15/2011 [id number]  Group Topic/Focus:  Making Healthy Choices:   The focus of this group is to help patients identify negative/unhealthy choices they were using prior to admission and identify positive/healthier coping strategies to replace them upon discharge.  Participation Level:  Active  Participation Quality:  Appropriate  Affect:  Appropriate  Cognitive:  Appropriate  Insight:  Good  Engagement in Group:  Good  Additional CommentsCasilda Carls 11/15/2011, 6:41 PM

## 2011-11-15 NOTE — Progress Notes (Signed)
Psychoeducational Group Note  Date:  11/15/2011 Time:  1000  Group Topic/Focus:  Wellness Toolbox:   The focus of this group is to discuss various aspects of wellness, balancing those aspects and exploring ways to increase the ability to experience wellness.  Patients will create a wellness toolbox for use upon discharge.  Participation Level:  Active  Participation Quality:  Appropriate, Attentive, Sharing and Supportive  Affect:  Appropriate  Cognitive:  Alert and Appropriate  Insight:  Good  Engagement in Group:  Good  Additional Comments:  Productive group  Earline Mayotte 11/15/2011, 3:30 PM

## 2011-11-15 NOTE — Progress Notes (Signed)
D - Patient slept late this am, did not go to breakfast and required multiple reminders to wake up this am. Pt denies SI/HI and hallucinations. Patient rates her depression as a 6 out of 10 with 10 being the worst and hopelessness as a 6 out of 10. Patient is concerned about withdrawal symptoms because she is stopping her librium today.  A - Patient given support and encouragement through therapeutic conversation. Encouraged patient to continue to speak about her concerns to the staff and physician.  R - Q 15 minute checks continued to maintain safety.

## 2011-11-15 NOTE — Progress Notes (Signed)
D.  Pt anxious on approach, states she has been increasingly anxious today.  States she feels that she is "jonesing" today.  Pt requests something for anxiety be ordered since this was all discontinued when her protocol got discontinued.  Denies SI/HI/hallucinations.  Interacting appropriately within milieu.  Positive for evening group, see group notes.  A.  Doctor on call notified of Pt's request for anxiety medication, orders received.  Support and encouragement offered.  R.  No acute distress, currently in group, will continue monitor.

## 2011-11-15 NOTE — Progress Notes (Signed)
Psychoeducational Group Note  Date:  11/15/2011 Time:  1515  Group Topic/Focus:  Making Healthy Choices:   The focus of this group is to help patients identify negative/unhealthy choices they were using prior to admission and identify positive/healthier coping strategies to replace them upon discharge.  Participation Level:  Did Not Attend  Participation Quality:  AffectCasilda Carls 11/15/2011, 6:44 PM

## 2011-11-15 NOTE — Progress Notes (Signed)
Psychoeducational Group Note  Date:  11/15/2011 Time:  1315  Group Topic/Focus:  Wellness Toolbox:   The focus of this group is to discuss various aspects of wellness, balancing those aspects and exploring ways to increase the ability to experience wellness.  Patients will create a wellness toolbox for use upon discharge.  Participation Level:  Active  Participation Quality:  Appropriate, Attentive, Sharing and Supportive  Affect:  Appropriate  Cognitive:  Alert, Appropriate and Oriented  Insight:  Good  Engagement in Group:  Good  Additional Comments:  Productive group  Sandria Senter 11/15/2011, 3:33 PM

## 2011-11-15 NOTE — Progress Notes (Signed)
Took over Pt's care at 2330.  Pt resting in bed, no distress noted, respirations even and unlabored.  Will continue to monitor.

## 2011-11-15 NOTE — Progress Notes (Signed)
[facility] MD Progress Note  11/15/2011 10:05 AM Julie Burnett  MRN:  [id number]  Diagnosis:   Axis I: Anxiety Disorder NOS, Major Depression, Recurrent severe and Polysubstance dependence Axis II: Deferred Axis III:  Past Medical History  Diagnosis Date  . Anxiety   . Bipolar 1 disorder   . Depression   . Liver disease   . Diabetes mellitus    Subjective: Julie Burnett states "I got up today." She reports that she has been sleeping all day and all night, and cannot figure out why. She is concerned that she gets her last Librium for detox today. She feels the Neurontin has helped somewhat with her numbness and tingling in her extremities. She rates her depression as a 5/10, 10 being the worst. She rates her anxiety as a 4/10. She denies any auditory or visual hallucinations. She denies any suicidal or homicidal ideation. She denies any cravings for substances, but she does report some tremors.  ADL's:  Intact  Sleep: Good  Appetite:  Fair  Suicidal Ideation:  Patient denies any thought, plan, or intent Homicidal Ideation:  Patient denies any thought, plan, or intent  AEB (as evidenced by):  Mental Status Examination/Evaluation: Objective:  Appearance: Disheveled  Eye Contact::  Fair  Speech:  Clear and Coherent  Volume:  Normal  Mood:  Anxious and Depressed  Affect:  Congruent  Thought Process:  Logical  Orientation:  Full  Thought Content:  WDL  Suicidal Thoughts:  No  Homicidal Thoughts:  No  Memory:  Immediate;   Good Recent;   Good Remote;   Good  Judgement:  Fair  Insight:  Fair  Psychomotor Activity:  Normal  Concentration:  Good  Recall:  Good  Akathisia:  No  Handed:    AIMS (if indicated):     Assets:  Desire for Improvement  Sleep:  Number of Hours: 6    Vital Signs:Blood pressure 129/89, pulse 89, temperature 98.3 F (36.8 C), temperature source Oral, resp. rate 16, height 5\' 3"  (1.6 m), weight 86.183 kg (190 lb), last menstrual period 10/27/2011. Current  Medications: Current Facility-Administered Medications  Medication Dose Route Frequency Provider Last Rate Last Dose  . acetaminophen (TYLENOL) tablet 650 mg  650 mg Oral Q6H PRN Rachael Fee, MD      . albuterol (PROVENTIL HFA;VENTOLIN HFA) 108 (90 BASE) MCG/ACT inhaler 2 puff  2 puff Inhalation Q6H PRN Rachael Fee, MD      . alum & mag hydroxide-simeth (MAALOX/MYLANTA) 200-200-20 MG/5ML suspension 30 mL  30 mL Oral Q4H PRN Rachael Fee, MD      . [EXPIRED] chlordiazePOXIDE (LIBRIUM) capsule 25 mg  25 mg Oral Q6H PRN Rachael Fee, MD      . [address] chlordiazePOXIDE (LIBRIUM) capsule 25 mg  25 mg Oral BH-qamhs Rachael Fee, MD   25 mg at 11/14/11 2205   Followed by  . [COMPLETED] chlordiazePOXIDE (LIBRIUM) capsule 25 mg  25 mg Oral Daily Rachael Fee, MD   25 mg at 11/15/11 0923  . citalopram (CELEXA) tablet 20 mg  20 mg Oral Daily Rachael Fee, MD   20 mg at 11/15/11 7829  . gabapentin (NEURONTIN) capsule 200 mg  200 mg Oral TID Jorje Guild, PA-C   200 mg at 11/15/11 5621  . [EXPIRED] hydrOXYzine (ATARAX/VISTARIL) tablet 25 mg  25 mg Oral Q6H PRN Rachael Fee, MD   25 mg at 11/14/11 1254  . [EXPIRED] loperamide (IMODIUM) capsule 2-4 mg  2-4  mg Oral PRN Rachael Fee, MD      . magnesium hydroxide (MILK OF MAGNESIA) suspension 30 mL  30 mL Oral Daily PRN Rachael Fee, MD      . metFORMIN (GLUCOPHAGE) tablet 500 mg  500 mg Oral Daily Rachael Fee, MD   500 mg at 11/15/11 1610  . mirtazapine (REMERON) tablet 15 mg  15 mg Oral QHS Jorje Guild, PA-C   15 mg at 11/14/11 2205  . multivitamin with minerals tablet 1 tablet  1 tablet Oral Daily Rachael Fee, MD   1 tablet at 11/15/11 [phone]  . nicotine (NICODERM CQ - dosed in mg/24 hours) patch 21 mg  21 mg Transdermal Q0600 Rachael Fee, MD   21 mg at 11/15/11 0600  . [EXPIRED] ondansetron (ZOFRAN-ODT) disintegrating tablet 4 mg  4 mg Oral Q6H PRN Rachael Fee, MD      . thiamine (B-1) injection 100 mg  100 mg Intramuscular Once Rachael Fee, MD      . thiamine (VITAMIN B-1) tablet 100 mg  100 mg Oral Daily Rachael Fee, MD   100 mg at 11/15/11 0923  . traZODone (DESYREL) tablet 100 mg  100 mg Oral QHS PRN Rachael Fee, MD   100 mg at 11/14/11 2205  . traZODone (DESYREL) tablet 50 mg  50 mg Oral QHS,MR X 1 Rachael Fee, MD   50 mg at 11/13/11 2300  . [DISCONTINUED] mirtazapine (REMERON) tablet 15 mg  15 mg Oral BID Rachael Fee, MD   15 mg at 11/14/11 5409    Lab Results:  Results for orders placed during the hospital encounter of 11/11/11 (from the past 48 hour(s))  GLUCOSE, CAPILLARY     Status: Abnormal   Collection Time   11/13/11 12:13 PM      Component Value Range Comment   Glucose-Capillary 129 (*) 70 - 99 mg/dL   GLUCOSE, CAPILLARY     Status: Abnormal   Collection Time   11/13/11  5:23 PM      Component Value Range Comment   Glucose-Capillary 123 (*) 70 - 99 mg/dL    Comment 1 Notify RN     GLUCOSE, CAPILLARY     Status: Abnormal   Collection Time   11/14/11  6:10 AM      Component Value Range Comment   Glucose-Capillary 136 (*) 70 - 99 mg/dL   GLUCOSE, CAPILLARY     Status: Abnormal   Collection Time   11/14/11 11:45 AM      Component Value Range Comment   Glucose-Capillary 125 (*) 70 - 99 mg/dL   GLUCOSE, CAPILLARY     Status: Abnormal   Collection Time   11/14/11  4:50 PM      Component Value Range Comment   Glucose-Capillary 120 (*) 70 - 99 mg/dL   GLUCOSE, CAPILLARY     Status: Abnormal   Collection Time   11/15/11  6:17 AM      Component Value Range Comment   Glucose-Capillary 152 (*) 70 - 99 mg/dL     Physical Findings: AIMS: Facial and Oral Movements Muscles of Facial Expression: None, normal Lips and Perioral Area: None, normal Jaw: None, normal Tongue: None, normal,Extremity Movements Upper (arms, wrists, hands, fingers): None, normal Lower (legs, knees, ankles, toes): None, normal, Trunk Movements Neck, shoulders, hips: None, normal, Overall Severity Severity of abnormal movements  (highest score from questions above): None, normal Incapacitation due to abnormal movements: None,  normal Patient's awareness of abnormal movements (rate only patient's report): No Awareness, Dental Status Current problems with teeth and/or dentures?: Yes Does patient usually wear dentures?: No  CIWA:  CIWA-Ar Total: 2  COWS:  COWS Total Score: 1   Treatment Plan Summary: Daily contact with patient to assess and evaluate symptoms and progress in treatment Medication management  Plan: We changed her Remeron to bedtime only last night. We will continue her safe medical detox. We will continue to research options for followup placement.  Hanifa Antonetti 11/15/2011, 10:05 AM

## 2011-11-16 LAB — GLUCOSE, CAPILLARY: Glucose-Capillary: 156 mg/dL — ABNORMAL HIGH (ref 70–99)

## 2011-11-16 MED ORDER — HYDROXYZINE HCL 50 MG PO TABS: 50.0000 mg | ORAL_TABLET | Freq: Three times a day (TID) | ORAL | Status: DC | PRN

## 2011-11-16 MED ORDER — GABAPENTIN 100 MG PO CAPS: 200.0000 mg | ORAL_CAPSULE | Freq: Three times a day (TID) | ORAL | Status: DC

## 2011-11-16 MED ORDER — HYDROXYZINE HCL 50 MG PO TABS
50.0000 mg | ORAL_TABLET | Freq: Three times a day (TID) | ORAL | Status: DC | PRN
  Administered 2011-11-16 (×2): 50 mg via ORAL
  Filled 2011-11-16: qty 20

## 2011-11-16 MED ORDER — MIRTAZAPINE 15 MG PO TABS: 15.0000 mg | ORAL_TABLET | Freq: Every day | ORAL | Status: DC

## 2011-11-16 MED ORDER — CITALOPRAM HYDROBROMIDE 20 MG PO TABS: 20.0000 mg | ORAL_TABLET | Freq: Every day | ORAL | Status: DC

## 2011-11-16 NOTE — Progress Notes (Signed)
Discharged from hospital this evening, not too happy, crying and very tearful, d/t not really having a place to go to until Thursday when she should be going to [facility], encouraged her to continue to call [facility] every day Hopefully she will get in sooner. Denies Si or HI, given instructions and samples of her med along w/scripts. She does not have any questions for this nurse but she is worried about getting out of here and using again. Gave the phone numbers to call in case of emergency or other numbers as she may need them. Left w/a friend.

## 2011-11-16 NOTE — Social Work (Signed)
Aftercare Planning Group: 11/16/2011 9:45 AM  Pt attended discharge planning group and actively participated in group.  CSW provided pt with today's workbook.  Pt presents with flat affect and depressed mood.  Pt rates depression and anxiety at a 4 today.  Pt denies SI/HI.  Pt states that she wanted to go to treatment from here but was recently at [facility] in May.  Pt arranged an interview for an [facility] on Thursday and plans on working on [employer] as well.  CSW still seeking appropriate follow up.  No further needs voiced by pt at this time.  Safety planning and suicide prevention discussed.  Pt participated in discussion and acknowledged an understanding of the information provided.       [facility] Group Note : Clinical Social Worker Group Therapy  11/16/2011  1:15 PM  Type of Therapy:  Group Therapy  Participation Level:  Appropriate  Participation Quality:  Appropriate   Affect:  Depressed and Flat  Cognitive:  Alert  Insight:  Limited  Engagement in Group:  Limited  Engagement in Therapy:  Limited  Modes of Intervention:  Clarification, Education, Problem-solving, Socialization and Support  Summary of Progress/Problems: The topic for group today was overcoming obstacles.  Pt did not share on participate in the discussion but appeared to be actively listening.       Elwanda Moger Horton, LCSWA 11/16/2011 1:16 PM

## 2011-11-16 NOTE — [facility] Suicide Risk Assessment (Signed)
Suicide Risk Assessment  Discharge Assessment     Demographic Factors:  Caucasian, Living alone and Unemployed  Mental Status Per Nursing Assessment::   On Admission:  Suicidal ideation indicated by patient  Current Mental Status by Physician: Denies suicidal ideas, plans or intent. She is in full contact with reality. She admits that she has been inpatient and in rehab before, and that she knows what she needs to do. She has a half way house lined up for Thursday. She is going to stay with a friend who drinks, but she hopes she can abstain even if he was to drink. Her environment is one of her triggers. She feels that the half way house is going to allow her to be ina supportive environment.   Loss Factors: NA  Historical Factors: NA  Risk Reduction Factors:   wants to be sober  Continued Clinical Symptoms:  Alcohol/Substance Abuse/Dependencies  Cognitive Features That Contribute To Risk: Denies   Suicide Risk:  Minimal: No identifiable suicidal ideation.  Patients presenting with no risk factors but with morbid ruminations; may be classified as minimal risk based on the severity of the depressive symptoms  Discharge Diagnoses:   AXIS I:  Polysubstance dependence, Opioid Dependence, Depressive Disorder, Anxiety Disorder AXIS II:  Deferred AXIS III:   Past Medical History  Diagnosis Date  . Anxiety   . Bipolar 1 disorder   . Depression   . Liver disease   . Diabetes mellitus    AXIS IV:  housing problems, occupational problems and problems with primary support group AXIS V:  61-70 mild symptoms  Plan Of Care/Follow-up recommendations:  Activity:  As tolerated Diet:  Regular While waiting to go to the Half way house go to as many  AA/NA meetings as possible  Is patient on multiple antipsychotic therapies at discharge:  No   Has Patient had three or more failed trials of antipsychotic monotherapy by history:  No  Recommended Plan for Multiple Antipsychotic  Therapies: N/A   Susette Seminara A 11/16/2011, 3:29 PM

## 2011-11-16 NOTE — Discharge Summary (Signed)
Physician Discharge Summary Note  Patient:  Julie Burnett is an 41 y.o., female MRN:  [id number] DOB:  12-19-1970 Patient phone:  [phone] (home)  Patient address:   [address] [place] [address],   Date of Admission:  11/11/2011 Date of Discharge: 11/16/2011  Reason for Admission:  Polysubstance dependency, alcohol detox, depression  Discharge Diagnoses: Active Problems:  Opioid dependence  Polysubstance dependence  Depressive disorder  Anxiety disorder  Diabetes mellitus  Axis Diagnosis:  AXIS I:  Alcohol Abuse, Depressive Disorder NOS, Generalized Anxiety Disorder and Substance Abuse AXIS II:  Deferred AXIS III:   Past Medical History  Diagnosis Date  . Anxiety   . Bipolar 1 disorder   . Depression   . Liver disease   . Diabetes mellitus    AXIS IV:  economic problems, housing problems, other psychosocial or environmental problems, problems related to social environment and problems with primary support group AXIS V:  61-70 mild symptoms  Level of Care:  OP  Hospital Course:   Patient attended individual and group therapy while inpatient along with attending AA groups, one-one time with MD daily, medications for detox and depression managed during inpatient, follow-up appointments made prior to discharge   Consults:  None  Significant Diagnostic Studies:  labs: Completed in the ED, reviewed, stable  Discharge Vitals:   Blood pressure 117/77, pulse 94, temperature 98.2 F (36.8 C), temperature source Oral, resp. rate 18, height 5\' 3"  (1.6 m), weight 86.183 kg (190 lb), last menstrual period 10/27/2011. Lab Results:   Results for orders placed during the hospital encounter of 11/11/11 (from the past 72 hour(s))  GLUCOSE, CAPILLARY     Status: Abnormal   Collection Time   11/13/11  5:23 PM      Component Value Range Comment   Glucose-Capillary 123 (*) 70 - 99 mg/dL    Comment 1 Notify RN     GLUCOSE, CAPILLARY     Status: Abnormal   Collection  Time   11/14/11  6:10 AM      Component Value Range Comment   Glucose-Capillary 136 (*) 70 - 99 mg/dL   GLUCOSE, CAPILLARY     Status: Abnormal   Collection Time   11/14/11 11:45 AM      Component Value Range Comment   Glucose-Capillary 125 (*) 70 - 99 mg/dL   GLUCOSE, CAPILLARY     Status: Abnormal   Collection Time   11/14/11  4:50 PM      Component Value Range Comment   Glucose-Capillary 120 (*) 70 - 99 mg/dL   GLUCOSE, CAPILLARY     Status: Abnormal   Collection Time   11/15/11  6:17 AM      Component Value Range Comment   Glucose-Capillary 152 (*) 70 - 99 mg/dL   GLUCOSE, CAPILLARY     Status: Abnormal   Collection Time   11/15/11 11:41 AM      Component Value Range Comment   Glucose-Capillary 155 (*) 70 - 99 mg/dL    Comment 1 Notify RN     GLUCOSE, CAPILLARY     Status: Abnormal   Collection Time   11/15/11  5:10 PM      Component Value Range Comment   Glucose-Capillary 137 (*) 70 - 99 mg/dL   GLUCOSE, CAPILLARY     Status: Abnormal   Collection Time   11/16/11  5:41 AM      Component Value Range Comment   Glucose-Capillary 148 (*) 70 - 99 mg/dL  GLUCOSE, CAPILLARY     Status: Abnormal   Collection Time   11/16/11 12:02 PM      Component Value Range Comment   Glucose-Capillary 149 (*) 70 - 99 mg/dL    Comment 1 Notify RN       Physical Findings: AIMS: Facial and Oral Movements Muscles of Facial Expression: None, normal Lips and Perioral Area: None, normal Jaw: None, normal Tongue: None, normal,Extremity Movements Upper (arms, wrists, hands, fingers): None, normal Lower (legs, knees, ankles, toes): None, normal, Trunk Movements Neck, shoulders, hips: None, normal, Overall Severity Severity of abnormal movements (highest score from questions above): None, normal Incapacitation due to abnormal movements: None, normal Patient's awareness of abnormal movements (rate only patient's report): No Awareness, Dental Status Current problems with teeth and/or dentures?:  Yes Does patient usually wear dentures?: No  CIWA:  CIWA-Ar Total: 0  COWS:  COWS Total Score: 1   Mental Status Exam: See Mental Status Examination and Suicide Risk Assessment completed by Attending Physician prior to discharge.  Discharge destination:  Home  Is patient on multiple antipsychotic therapies at discharge:  No   Has Patient had three or more failed trials of antipsychotic monotherapy by history:  No Recommended Plan for Multiple Antipsychotic Therapies:  N/A  Discharge Orders    Future Orders Please Complete By Expires   Diet - low sodium heart healthy      Activity as tolerated - No restrictions          Medication List     As of 11/16/2011  1:36 PM    STOP taking these medications         diazepam 5 MG tablet   Commonly known as: VALIUM      TAKE these medications      Indication    albuterol 108 (90 BASE) MCG/ACT inhaler   Commonly known as: PROVENTIL HFA;VENTOLIN HFA   Inhale 2 puffs into the lungs every 6 (six) hours as needed. Shortness of Breath/COPD       citalopram 20 MG tablet   Commonly known as: CELEXA   Take 1 tablet (20 mg total) by mouth daily.    Indication: Depression      gabapentin 100 MG capsule   Commonly known as: NEURONTIN   Take 2 capsules (200 mg total) by mouth 3 (three) times daily.    Indication: Neuropathic Pain      hydrOXYzine 50 MG tablet   Commonly known as: ATARAX/VISTARIL   Take 1 tablet (50 mg total) by mouth 3 (three) times daily as needed for anxiety.       metFORMIN 500 MG tablet   Commonly known as: GLUCOPHAGE   Take 500 mg by mouth daily.       mirtazapine 15 MG tablet   Commonly known as: REMERON   Take 1 tablet (15 mg total) by mouth at bedtime.    Indication: Trouble Sleeping      traZODone 100 MG tablet   Commonly known as: DESYREL   Take 100 mg by mouth at bedtime as needed. Sleep         Follow-up recommendations:  Activity as tolerated, low sodium heart healthy diet with modified carb  diet  Comments:  Patient denied suicidal/homicidal ideations and auditory/visual hallucinations, follow-up appointments encouraged to attend, outside support groups encouraged and information given   Signed: Narda Amber 11/16/2011, 1:36 PM

## 2011-11-16 NOTE — Tx Team (Addendum)
Interdisciplinary Treatment Plan Update (Adult)  Date:  11/16/2011  Time Reviewed:  10:12 AM   Progress in Treatment: Attending groups: Yes Participating in groups:  Yes Taking medication as prescribed: Yes Tolerating medication:  Yes Family/Significant othe contact made:  Yes Patient understands diagnosis:  Yes Discussing patient identified problems/goals with staff:  Yes Medical problems stabilized or resolved:  Yes Denies suicidal/homicidal ideation: Yes Issues/concerns per patient self-inventory:  None identified Other: N/A  New problem(s) identified: None Identified  Reason for Continuation of Hospitalization: Anxiety Depression Medication stabilization Withdrawal symptoms  Interventions implemented related to continuation of hospitalization: mood stabilization, medication monitoring and adjustment, group therapy and psycho education, safety checks q 15 mins  Additional comments: N/A  Estimated length of stay: 1 day, possible d/c today  Discharge Plan: SW is assessing for appropriate referrals.  Pt is working on getting into an [employer].    New goal(s): N/A  Review of initial/current patient goals per problem list:    1.  Goal(s): Address substance use  Met:  Yes  Target date: by discharge  As evidenced by: completing detox protocol and refer to appropriate treatment  2.  Goal (s): Reduce depressive and anxiety symptoms  Met:  No  Target date: by discharge  As evidenced by: Reducing depression from a 10 to a 3 as reported by pt.  Pt rates at a 4 today.     3.  Goal (s): Eliminate SI  Met:  Yes  Target date: by discharge  As evidenced by: Pt denies SI.   Attendees: Patient:Julie Burnett [facility]  11/16/2011 9:51 AM   Family:     Physician: Geoffery Lyons, MD 11/16/2011 9:48 AM   Nursing: Roswell Miners, RN 11/16/2011 9:48 AM   Clinical Social Worker:  Reyes Ivan, LCSWA 11/16/2011  9:48 AM   Other: Alease Frame, RN 11/16/2011  9:48 AM   Other:  Bubba Camp,  psyc intern 11/16/2011 9:52 AM   Other:  Nanine Means, NP 11/16/2011 9:52 AM   Other:     Other:      Scribe for Treatment Team:   Reyes Ivan 11/16/2011 9:48 AM

## 2011-11-16 NOTE — Progress Notes (Signed)
[facility] Case Management Discharge Plan:  Will you be returning to the same living situation after discharge: No. Pt was homeless and is working on getting into an [employer]. At discharge, do you have transportation home?:Yes,  access to transportation Do you have the ability to pay for your medications:Yes,  access to meds  Release of information consent forms completed and in the chart;  Patient's signature needed at discharge.  Patient to Follow up at:  Follow-up Information    Follow up with [facility]. On 11/18/2011. (Walk in on this date at 8:00 am)    Contact information:   [address], [place] [address] [phone]         Patient denies SI/HI:   Yes,  denies SI/HI    Safety Planning and Suicide Prevention discussed:  Yes,  discussed with pt today  Barrier to discharge identified:No.  Summary and Recommendations: Pt attended discharge planning group and actively participated in group.  SW provided pt with today's workbook.  Pt presents with flat affect and depressed mood.  Pt rates depression and anxiety at a 4 today.  Pt denies SI/HI.  Pt reports feeling stable to d/c today.  No recommendations from SW.  No further needs voiced by pt.  Pt stable to discharge.     Carmina Miller 11/16/2011, 3:48 PM

## 2011-11-16 NOTE — Progress Notes (Addendum)
Psychoeducational Group Note  Date:  11/16/2011 Time:  1100  Group Topic/Focus:  Self Care:   The focus of this group is to help patients understand the importance of self-care in order to improve or restore emotional, physical, spiritual, interpersonal, and financial health.  Participation Level:  Minimal  Participation Quality:  Appropriate and Attentive  Affect:  Appropriate  Cognitive:  Appropriate  Insight:  Good  Engagement in Group:  Limited  Additional Comments: Patient attend group on self care. Patient was asked to define self care in personal terms, and then was given the definition of self care. Patient was given self care assessment from Wellness workbook and rated the sections of physical, psychological, emotional, spiritual, and relationship care of areas that they do well in, their strengths in those area and weakness in areas. Patient stated one area that they would be willing to improve and make a goal for that area.  Karleen Hampshire Brittini 11/16/2011, 1:10 PM

## 2011-11-17 NOTE — Progress Notes (Signed)
Patient Discharge Instructions:  Scanned updates faxed 11/17/2011  Faxed to [facility] @ [phone]  Wandra Scot, 11/17/2011, 1:50 PM

## 2011-11-18 NOTE — Progress Notes (Signed)
Patient Discharge Instructions:  After Visit Summary (AVS):   Faxed to:  11/18/11 Psychiatric Admission Assessment Note:   Faxed to:  11/18/11 Suicide Risk Assessment - Discharge Assessment:   Faxed to:  11/18/11 Faxed/Sent to the Next Level Care provider:  11/18/11 Faxed to [facility] @ [phone]  Jerelene Redden, 11/18/2011, 4:02 PM

## 2011-11-22 NOTE — Discharge Summary (Signed)
Agree with assessment and plan Modell Fendrick A. Madellyn Denio, M.D. 

## 2011-12-21 ENCOUNTER — Emergency Department (HOSPITAL_COMMUNITY)
Admission: EM | Admit: 2011-12-21 | Discharge: 2011-12-23 | Disposition: A | Discharge status: Other Acute Inpatient Hospital | Payer: Self-pay | Attending: *Deleted | Admitting: *Deleted

## 2011-12-21 ENCOUNTER — Encounter (HOSPITAL_COMMUNITY): Payer: Self-pay | Admitting: *Deleted

## 2011-12-21 DIAGNOSIS — F329 Major depressive disorder, single episode, unspecified: Secondary | ICD-10-CM | POA: Insufficient documentation

## 2011-12-21 DIAGNOSIS — F411 Generalized anxiety disorder: Secondary | ICD-10-CM | POA: Insufficient documentation

## 2011-12-21 DIAGNOSIS — E119 Type 2 diabetes mellitus without complications: Secondary | ICD-10-CM | POA: Insufficient documentation

## 2011-12-21 DIAGNOSIS — F319 Bipolar disorder, unspecified: Secondary | ICD-10-CM | POA: Insufficient documentation

## 2011-12-21 DIAGNOSIS — F1910 Other psychoactive substance abuse, uncomplicated: Secondary | ICD-10-CM | POA: Insufficient documentation

## 2011-12-21 DIAGNOSIS — F17200 Nicotine dependence, unspecified, uncomplicated: Secondary | ICD-10-CM | POA: Insufficient documentation

## 2011-12-21 DIAGNOSIS — Z79899 Other long term (current) drug therapy: Secondary | ICD-10-CM | POA: Insufficient documentation

## 2011-12-21 DIAGNOSIS — Z8719 Personal history of other diseases of the digestive system: Secondary | ICD-10-CM | POA: Insufficient documentation

## 2011-12-21 DIAGNOSIS — F3289 Other specified depressive episodes: Secondary | ICD-10-CM | POA: Insufficient documentation

## 2011-12-21 LAB — CBC WITH DIFFERENTIAL/PLATELET
Basophils Absolute: 0 10*3/uL (ref 0.0–0.1)
Basophils Relative: 0 % (ref 0–1)
HCT: 36.8 % (ref 36.0–46.0)
Lymphocytes Relative: 33 % (ref 12–46)
MCHC: 32.6 g/dL (ref 30.0–36.0)
Monocytes Absolute: 0.5 10*3/uL (ref 0.1–1.0)
Neutro Abs: 5.5 10*3/uL (ref 1.7–7.7)
Platelets: 343 10*3/uL (ref 150–400)
RDW: 14.5 % (ref 11.5–15.5)
WBC: 9.2 10*3/uL (ref 4.0–10.5)

## 2011-12-21 LAB — BASIC METABOLIC PANEL
BUN: 6 mg/dL (ref 6–23)
Calcium: 9.6 mg/dL (ref 8.4–10.5)
Chloride: 98 mEq/L (ref 96–112)
Creatinine, Ser: 0.58 mg/dL (ref 0.50–1.10)
GFR calc Af Amer: >90 mL/min (ref >90)
GFR calc non Af Amer: >90 mL/min (ref >90)

## 2011-12-21 LAB — URINALYSIS, ROUTINE W REFLEX MICROSCOPIC
Glucose, UA: NEGATIVE mg/dL
Leukocytes, UA: NEGATIVE
Nitrite: NEGATIVE
Specific Gravity, Urine: <1.005 — ABNORMAL LOW (ref 1.005–1.030)
pH: 6 (ref 5.0–8.0)

## 2011-12-21 LAB — RAPID URINE DRUG SCREEN, HOSP PERFORMED
Barbiturates: NOT DETECTED
Cocaine: POSITIVE — AB

## 2011-12-21 LAB — ETHANOL: Alcohol, Ethyl (B): 68 mg/dL — ABNORMAL HIGH (ref 0–11)

## 2011-12-21 MED ORDER — NICOTINE 21 MG/24HR TD PT24
21.0000 mg | MEDICATED_PATCH | Freq: Every day | TRANSDERMAL | Status: DC
  Administered 2011-12-21 – 2011-12-23 (×3): 21 mg via TRANSDERMAL
  Filled 2011-12-21 (×3): qty 1

## 2011-12-21 MED ORDER — CITALOPRAM HYDROBROMIDE 20 MG PO TABS
ORAL_TABLET | ORAL | Status: AC
  Filled 2011-12-21: qty 1

## 2011-12-21 MED ORDER — MIRTAZAPINE 15 MG PO TABS
15.0000 mg | ORAL_TABLET | Freq: Every day | ORAL | Status: DC
  Administered 2011-12-21 – 2011-12-22 (×2): 15 mg via ORAL
  Filled 2011-12-21 (×4): qty 1

## 2011-12-21 MED ORDER — MIRTAZAPINE 30 MG PO TABS
ORAL_TABLET | ORAL | Status: AC
  Filled 2011-12-21: qty 1

## 2011-12-21 MED ORDER — ALBUTEROL SULFATE HFA 108 (90 BASE) MCG/ACT IN AERS
2.0000 | INHALATION_SPRAY | Freq: Four times a day (QID) | RESPIRATORY_TRACT | Status: DC | PRN
  Administered 2011-12-21: 2 via RESPIRATORY_TRACT
  Filled 2011-12-21: qty 6.7

## 2011-12-21 MED ORDER — ONDANSETRON HCL 4 MG PO TABS: 4.0000 mg | ORAL_TABLET | Freq: Three times a day (TID) | ORAL | Status: DC | PRN

## 2011-12-21 MED ORDER — MIRTAZAPINE 15 MG PO TABS
15.0000 mg | ORAL_TABLET | Freq: Every day | ORAL | Status: DC
  Filled 2011-12-21 (×2): qty 1

## 2011-12-21 MED ORDER — GABAPENTIN 100 MG PO CAPS
200.0000 mg | ORAL_CAPSULE | Freq: Three times a day (TID) | ORAL | Status: DC
  Administered 2011-12-21 – 2011-12-23 (×5): 200 mg via ORAL
  Administered 2011-12-23: 100 mg via ORAL
  Filled 2011-12-21 (×11): qty 2

## 2011-12-21 MED ORDER — HYDROXYZINE HCL 25 MG PO TABS
50.0000 mg | ORAL_TABLET | Freq: Three times a day (TID) | ORAL | Status: DC | PRN
  Administered 2011-12-21 – 2011-12-22 (×2): 50 mg via ORAL
  Filled 2011-12-21: qty 2
  Filled 2011-12-21: qty 1
  Filled 2011-12-21: qty 2
  Filled 2011-12-21: qty 1

## 2011-12-21 MED ORDER — GABAPENTIN 100 MG PO CAPS
ORAL_CAPSULE | ORAL | Status: AC
  Filled 2011-12-21: qty 2

## 2011-12-21 MED ORDER — IBUPROFEN 400 MG PO TABS
600.0000 mg | ORAL_TABLET | Freq: Three times a day (TID) | ORAL | Status: DC | PRN
  Administered 2011-12-22: 600 mg via ORAL
  Filled 2011-12-21: qty 2

## 2011-12-21 MED ORDER — CITALOPRAM HYDROBROMIDE 20 MG PO TABS
20.0000 mg | ORAL_TABLET | Freq: Every day | ORAL | Status: DC
  Administered 2011-12-22 – 2011-12-23 (×2): 20 mg via ORAL
  Filled 2011-12-21 (×5): qty 1

## 2011-12-21 MED ORDER — LORAZEPAM 1 MG PO TABS
1.0000 mg | ORAL_TABLET | Freq: Three times a day (TID) | ORAL | Status: DC | PRN
  Administered 2011-12-21 – 2011-12-23 (×5): 1 mg via ORAL
  Filled 2011-12-21 (×5): qty 1

## 2011-12-21 MED ORDER — METFORMIN HCL 500 MG PO TABS
500.0000 mg | ORAL_TABLET | Freq: Every day | ORAL | Status: DC
  Administered 2011-12-22 – 2011-12-23 (×2): 500 mg via ORAL
  Filled 2011-12-21 (×2): qty 1

## 2011-12-21 MED ORDER — HYDROXYZINE HCL 25 MG PO TABS
50.0000 mg | ORAL_TABLET | Freq: Once | ORAL | Status: AC
  Administered 2011-12-23: 25 mg via ORAL

## 2011-12-21 NOTE — ED Provider Notes (Signed)
History     CSN: [id number]  Arrival date & time 12/21/11  1648   First MD Initiated Contact with Patient 12/21/11 1740      Chief Complaint  Patient presents with  . V70.1     Patient is a 41 y.o. female presenting with mental health disorder. The history is provided by the patient and a relative.  Mental Health Problem The primary symptoms include dysphoric mood. The current episode started today. This is a recurrent problem.  The onset of the illness is precipitated by a stressful event. The degree of incapacity that she is experiencing as a consequence of her illness is moderate. Additional symptoms of the illness do not include no headaches or no abdominal pain. She admits to suicidal ideas. She does have a plan to commit suicide.  her course is worsening Pt presents for depression, suicidal ideation (plan is to overdose) and requesting detox She reports she had been in a "half way house" and had been clean for several weeks but then began to use drugs and was released from the facility Since discharge from the facility she has been experiencing SI No fever/vomiting/abd pain.  No HA.  No focal weakness.   Past Medical History  Diagnosis Date  . Anxiety   . Bipolar 1 disorder   . Depression   . Liver disease   . Diabetes mellitus     History reviewed. No pertinent past surgical history.  History reviewed. No pertinent family history.  History  Substance Use Topics  . Smoking status: Current Every Day Smoker -- 1.0 packs/day for 15 years    Types: Cigarettes  . Smokeless tobacco: Not on file  . Alcohol Use: 3.6 oz/week    6 Cans of beer per week     Comment: occasionally    OB History    Grav Para Term Preterm Abortions TAB SAB Ect Mult Living                  Review of Systems  Gastrointestinal: Negative for abdominal pain.  Neurological: Negative for headaches.  Psychiatric/Behavioral: Positive for dysphoric mood.  All other systems reviewed and are  negative.    Allergies  Codeine  Home Medications   Current Outpatient Rx  Name  Route  Sig  Dispense  Refill  . ALBUTEROL SULFATE HFA 108 (90 BASE) MCG/ACT IN AERS   Inhalation   Inhale 2 puffs into the lungs every 6 (six) hours as needed. Shortness of Breath/COPD         . CITALOPRAM HYDROBROMIDE 20 MG PO TABS   Oral   Take 1 tablet (20 mg total) by mouth daily.   30 tablet   0   . GABAPENTIN 100 MG PO CAPS   Oral   Take 2 capsules (200 mg total) by mouth 3 (three) times daily.   180 capsule   0   . HYDROXYZINE HCL 50 MG PO TABS   Oral   Take 1 tablet (50 mg total) by mouth 3 (three) times daily as needed for anxiety.   30 tablet   0   . METFORMIN HCL 500 MG PO TABS   Oral   Take 500 mg by mouth daily.         Marland Kitchen MIRTAZAPINE 15 MG PO TABS   Oral   Take 1 tablet (15 mg total) by mouth at bedtime.   30 tablet   0   . TRAZODONE HCL 100 MG PO TABS   Oral  Take 100 mg by mouth at bedtime as needed. Sleep           BP 130/73  Pulse 89  Temp 97.8 F (36.6 C) (Oral)  Resp 20  Ht 5\' 3"  (1.6 m)  Wt 194 lb (87.998 kg)  BMI 34.37 kg/m2  SpO2 98%  LMP 12/06/2011  Physical Exam CONSTITUTIONAL: Well developed/well nourished HEAD AND FACE: Normocephalic/atraumatic EYES: EOMI/PERRL ENMT: Mucous membranes moist NECK: supple no meningeal signs CV: S1/S2 noted, no murmurs/rubs/gallops noted LUNGS: Lungs are clear to auscultation bilaterally, no apparent distress ABDOMEN: soft, nontender, no rebound or guarding NEURO: Pt is awake/alert, moves all extremitiesx4 EXTREMITIES: pulses normal, full ROM SKIN: warm, color normal PSYCH: no abnormalities of mood noted  ED Course  Procedures   Labs Reviewed  URINE RAPID DRUG SCREEN (HOSP PERFORMED) - Abnormal; Notable for the following:    Cocaine POSITIVE (*)     All other components within normal limits  URINALYSIS, ROUTINE W REFLEX MICROSCOPIC - Abnormal; Notable for the following:    Specific Gravity,  Urine <1.005 (*)     All other components within normal limits  CBC WITH DIFFERENTIAL  POCT PREGNANCY, URINE  BASIC METABOLIC PANEL  ETHANOL  6:57 PM Pt is currently voluntary here for depression.   Will consult telepsych 7:11 PM Labs reassuring She has not been assessed as of yet, she has not been accepted as of yet Call to ACT placed Also placed telepsych consult   MDM  Nursing notes including past medical history and social history reviewed and considered in documentation Labs/vital reviewed and considered Previous records reviewed and considered - h/o depression and has been admitted previously         Joya Gaskins, MD 12/21/11 1911

## 2011-12-21 NOTE — ED Notes (Signed)
Pt was "kicked out " of a half way house   Was using xanax and cocaine.  Says she is suicidal now.

## 2011-12-21 NOTE — ED Provider Notes (Signed)
History     CSN: [id number]  Arrival date & time 12/21/11  1648   First MD Initiated Contact with Patient 12/21/11 1740      Chief Complaint  Patient presents with  . V70.1    (Consider location/radiation/quality/duration/timing/severity/associated sxs/prior treatment) HPI..... patient discharged from halfway house secondary to polysubstance abuse.  Claims to be suicidal. No other complaints. Severity is mild to moderate. Nothing makes symptoms better or worse.  Past Medical History  Diagnosis Date  . Anxiety   . Bipolar 1 disorder   . Depression   . Liver disease   . Diabetes mellitus     History reviewed. No pertinent past surgical history.  History reviewed. No pertinent family history.  History  Substance Use Topics  . Smoking status: Current Every Day Smoker -- 1.0 packs/day for 15 years    Types: Cigarettes  . Smokeless tobacco: Not on file  . Alcohol Use: 3.6 oz/week    6 Cans of beer per week     Comment: occasionally    OB History    Grav Para Term Preterm Abortions TAB SAB Ect Mult Living                  Review of Systems  All other systems reviewed and are negative.    Allergies  Codeine  Home Medications   Current Outpatient Rx  Name  Route  Sig  Dispense  Refill  . CITALOPRAM HYDROBROMIDE 20 MG PO TABS   Oral   Take 1 tablet (20 mg total) by mouth daily.   30 tablet   0   . GABAPENTIN 100 MG PO CAPS   Oral   Take 2 capsules (200 mg total) by mouth 3 (three) times daily.   180 capsule   0   . HYDROXYZINE HCL 50 MG PO TABS   Oral   Take 1 tablet (50 mg total) by mouth 3 (three) times daily as needed for anxiety.   30 tablet   0   . METFORMIN HCL 500 MG PO TABS   Oral   Take 500 mg by mouth daily.         Marland Kitchen MIRTAZAPINE 15 MG PO TABS   Oral   Take 1 tablet (15 mg total) by mouth at bedtime.   30 tablet   0   . TRAZODONE HCL 100 MG PO TABS   Oral   Take 100 mg by mouth at bedtime as needed. Sleep         .  ALBUTEROL SULFATE HFA 108 (90 BASE) MCG/ACT IN AERS   Inhalation   Inhale 2 puffs into the lungs every 6 (six) hours as needed. Shortness of Breath/COPD           BP 123/74  Pulse 96  Temp 97.7 F (36.5 C) (Oral)  Resp 20  Ht 5\' 3"  (1.6 m)  Wt 194 lb (87.998 kg)  BMI 34.37 kg/m2  SpO2 98%  LMP 12/06/2011  Physical Exam  Nursing note and vitals reviewed. Constitutional: She is oriented to person, place, and time. She appears well-developed and well-nourished.  HENT:  Head: Normocephalic and atraumatic.  Eyes: Conjunctivae normal and EOM are normal. Pupils are equal, round, and reactive to light.  Neck: Normal range of motion. Neck supple.  Cardiovascular: Normal rate, regular rhythm and normal heart sounds.   Pulmonary/Chest: Effort normal and breath sounds normal.  Abdominal: Soft. Bowel sounds are normal.  Musculoskeletal: Normal range of motion.  Neurological: She is  alert and oriented to person, place, and time.  Skin: Skin is warm and dry.  Psychiatric:       Flat affect, depressed    ED Course  Procedures (including critical care time)  Labs Reviewed  BASIC METABOLIC PANEL - Abnormal; Notable for the following:    Glucose, Bld 163 (*)     All other components within normal limits  ETHANOL - Abnormal; Notable for the following:    Alcohol, Ethyl (B) 68 (*)     All other components within normal limits  URINE RAPID DRUG SCREEN (HOSP PERFORMED) - Abnormal; Notable for the following:    Cocaine POSITIVE (*)     All other components within normal limits  URINALYSIS, ROUTINE W REFLEX MICROSCOPIC - Abnormal; Notable for the following:    Specific Gravity, Urine <1.005 (*)     All other components within normal limits  CBC WITH DIFFERENTIAL  POCT PREGNANCY, URINE   No results found.   1. Depression   2. Polysubstance abuse       MDM  Will obtain behavioral health consult.        Donnetta Hutching, MD 12/24/11 [phone]

## 2011-12-22 MED ORDER — TRAZODONE HCL 50 MG PO TABS
ORAL_TABLET | ORAL | Status: AC
  Filled 2011-12-22: qty 2

## 2011-12-22 MED ORDER — TRAZODONE HCL 50 MG PO TABS
100.0000 mg | ORAL_TABLET | Freq: Every day | ORAL | Status: DC
  Administered 2011-12-22: 100 mg via ORAL
  Filled 2011-12-22 (×2): qty 1

## 2011-12-22 NOTE — ED Notes (Signed)
Daughter at bedside, visiting with pt, sitter remains at bedside,

## 2011-12-22 NOTE — ED Notes (Signed)
Pt was seen with cigarette and matches, pt scrubs changed, room searched, one cigarette and pack of matches found, thrown away in sharp container, unsure of pt had them already or if daughter had given them to pt.

## 2011-12-22 NOTE — ED Notes (Signed)
Pt witnessed using personal cell phone in room, pt denied having a cell phone when asked about having anything in her possession, pt then became angry and stated "I have my numbers in it and need it", explained to pt that she is not allowed to have a personal cell phone, one cell phone removed from inside of her shirt by pt, phone placed in pt belongings bag and tied shut.

## 2011-12-22 NOTE — ED Notes (Signed)
Pt speaking with family on phone, sitter remains at bedside,

## 2011-12-22 NOTE — ED Notes (Addendum)
Pt lying on stretcher, resting with eyes closed, arouses to verbal stimuli, denies any thoughts of hurting herself or anyone else at the moment. Sitter remains at bedside, breakfast tray given,

## 2011-12-22 NOTE — ED Notes (Signed)
Julie Burnett ACT in room with pt,

## 2011-12-22 NOTE — ED Notes (Signed)
Pt ambulatory to restroom, sitter remains at bedside,  

## 2011-12-22 NOTE — BHH Counselor (Signed)
Julie Burnett, assessment counselor at [facility], submitted Pt for admission to [facility]. Gave clinical report to Nanine Means, NP who declined Pt due to Pt needing longer term treatment than this facility can provide.  Harlin Rain Patsy Baltimore, LPC, NCC

## 2011-12-22 NOTE — BH Assessment (Addendum)
Assessment Note   Julie Burnett is an 41 y.o. female. The patient presented to the ED 12/21/2011 with suicidal ideation with a plan to overdose on prescription medications. She reports using both opiates and Xanax while in the half way house. She was drug tested on 12/19/2011 and was ejected from the house for positive drug tests. Since then she has been using some combination of alcohol, opiates and benzos daily. She does not want to continue this behavior and is seeking detox. She continues having suicidal thoughts as she is afraid her life style will not change. She is not homicidal and has no history of violence. She is neither hallucinated nor delusional. She can not contract for safety. She was seen by tele-psych and the recommendation was inpatient treatment. Discussed with Dr Rulon Abide, who is in agreement with that recommendation. Patient referred to [facility] and to [facility]  Axis I:  Bipolar Disorder unspecified-296.80 Axis II: Deferred Axis III:  Past Medical History  Diagnosis Date  . Anxiety   . Bipolar 1 disorder   . Depression   . Liver disease   . Diabetes mellitus    Axis IV: problems related to legal system/crime, problems with primary support group and  Continued/relapse-drug use Axis V: 31-40 impairment in reality testing  Past Medical History:  Past Medical History  Diagnosis Date  . Anxiety   . Bipolar 1 disorder   . Depression   . Liver disease   . Diabetes mellitus     History reviewed. No pertinent past surgical history.  Family History: History reviewed. No pertinent family history.  Social History:  reports that she has been smoking Cigarettes.  She has a 15 pack-year smoking history. She does not have any smokeless tobacco history on file. She reports that she drinks about 3.6 ounces of alcohol per week. She reports that she uses illicit drugs.  Additional Social History:     CIWA: CIWA-Ar BP:  (127/84) Pulse Rate:  (78) COWS:    Allergies:   Allergies  Allergen Reactions  . Codeine Itching and Rash    Home Medications:  (Not in a hospital admission)  OB/GYN Status:  Patient's last menstrual period was 12/06/2011.  General Assessment Data Location of Assessment: [facility] ACT Assessment: Yes Living Arrangements: Non-relatives/Friends (was in half way house until 12/19/2011) Can pt return to current living arrangement?: No (needs to return to half way house after discharge) Admission Status: Voluntary Is patient capable of signing voluntary admission?: Yes Transfer from: Acute Hospital Referral Source: MD  Education Status Is patient currently in school?: No  Risk to self Suicidal Ideation: Yes-Currently Present Suicidal Intent: Yes-Currently Present Is patient at risk for suicide?: Yes Suicidal Plan?: Yes-Currently Present Specify Current Suicidal Plan: overdose onj prescription medications Access to Means: Yes Specify Access to Suicidal Means: has medications on hand What has been your use of drugs/alcohol within the last 12 months?: using Xanax,opiates, marijuana, and alcohol daily since 12/19/2011 Previous Attempts/Gestures: Yes How many times?: 1  Other Self Harm Risks: no Triggers for Past Attempts: Other (Comment) (depression and SA) Intentional Self Injurious Behavior: Cutting (used to cut at an early age) Family Suicide History: No Recent stressful life event(s): Conflict (Comment);Recent negative physical changes;Turmoil (Comment);Other (Comment) (relapsed ad was ejected from half way house) Persecutory voices/beliefs?: No Depression: Yes Depression Symptoms: Guilt;Loss of interest in usual pleasures;Feeling worthless/self pity Substance abuse history and/or treatment for substance abuse?: Yes Suicide prevention information given to non-admitted patients: Not applicable  Risk to  Others Homicidal Ideation: No Thoughts of Harm to Others: No Current Homicidal Intent: No Current Homicidal Plan:  No Access to Homicidal Means: No History of harm to others?: No Assessment of Violence: None Noted Does patient have access to weapons?: No Criminal Charges Pending?: No Does patient have a court date: No  Psychosis Hallucinations: None noted Delusions: None noted  Mental Status Report Appear/Hygiene: Disheveled Eye Contact: Poor Motor Activity: Freedom of movement Speech: Logical/coherent Level of Consciousness: Alert Mood: Depressed;Anhedonia Affect: Depressed;Blunted Anxiety Level: None Thought Processes: Coherent;Relevant Judgement: Unimpaired Orientation: Person;Place;Time;Situation Obsessive Compulsive Thoughts/Behaviors: None  Cognitive Functioning Concentration: Decreased Memory: Recent Intact;Remote Intact IQ: Average Insight: Poor Impulse Control: Poor Appetite: Good Weight Loss: 0  Weight Gain: 0  Sleep: No Change Vegetative Symptoms: None  ADLScreening [facility] Assessment Services) Patient's cognitive ability adequate to safely complete daily activities?: Yes Patient able to express need for assistance with ADLs?: Yes Independently performs ADLs?: Yes (appropriate for developmental age)  Abuse/Neglect [facility]) Physical Abuse: Denies Verbal Abuse: Denies Sexual Abuse: Denies  Prior Inpatient Therapy Prior Inpatient Therapy: Yes Prior Therapy Dates: 2013 Prior Therapy Facilty/Provider(s): [facility] Reason for Treatment: depression;SA  Prior Outpatient Therapy Prior Outpatient Therapy: Yes Prior Therapy Dates: 2013 Prior Therapy Facilty/Provider(s): [facility] Reason for Treatment: medications  ADL Screening (condition at time of admission) Patient's cognitive ability adequate to safely complete daily activities?: Yes Patient able to express need for assistance with ADLs?: Yes Independently performs ADLs?: Yes (appropriate for developmental age)       Abuse/Neglect Assessment (Assessment to be complete while patient is  alone) Physical Abuse: Denies Verbal Abuse: Denies Sexual Abuse: Denies Values / Beliefs Cultural Requests During Hospitalization: None Spiritual Requests During Hospitalization: None     Nutrition Screen- [facility] Adult/[facility] Patient's home diet: Other (Comment) (diabetic)  Additional Information 1:1 In Past 12 Months?: No CIRT Risk: No Elopement Risk: No Does patient have medical clearance?: Yes     Disposition: PATIENT HAS BEEN ACCEPTED TO [facility] BY DR  Forrestine Him. SHE IS TO BE TRANSPORTED BY [facility]. [facility] IS IN AGREEMENT WITH THE DISPOSITION. Disposition Disposition of Patient: Inpatient treatment program Type of inpatient treatment program: Adult  On Site Evaluation by:   Reviewed with Physician:     Jearld Pies 12/22/2011 9:30 AM

## 2011-12-23 NOTE — ED Notes (Signed)
Patient is resting comfortably. 

## 2011-12-23 NOTE — BHH Counselor (Signed)
THE PATIENT HAS BEEN ACCEPTED TO [facility] AS A VOLUNTARY ADMISSION BY DR Forrestine Him. 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 A 3 DAY AUTHORIZATION WAS APPROVED BY FRANCINA. [id number].  DR [facility] MANUS ADVISED OF THE ACCEPTANCE AND PENDING TRANSFER.

## 2011-12-23 NOTE — ED Provider Notes (Signed)
Pt accepted to [facility], Dr. Forrestine Him.  Will transfer stable.   Laray Anger, DO 12/23/11 1028

## 2012-04-22 ENCOUNTER — Emergency Department (HOSPITAL_COMMUNITY)
Admission: EM | Admit: 2012-04-22 | Discharge: 2012-04-22 | Disposition: A | Discharge status: Home / Self Care | Payer: Self-pay | Attending: Emergency Medicine | Admitting: Emergency Medicine

## 2012-04-22 ENCOUNTER — Encounter (HOSPITAL_COMMUNITY): Payer: Self-pay | Admitting: *Deleted

## 2012-04-22 DIAGNOSIS — E119 Type 2 diabetes mellitus without complications: Secondary | ICD-10-CM | POA: Insufficient documentation

## 2012-04-22 DIAGNOSIS — R5383 Other fatigue: Secondary | ICD-10-CM | POA: Insufficient documentation

## 2012-04-22 DIAGNOSIS — F319 Bipolar disorder, unspecified: Secondary | ICD-10-CM | POA: Insufficient documentation

## 2012-04-22 DIAGNOSIS — R5381 Other malaise: Secondary | ICD-10-CM | POA: Insufficient documentation

## 2012-04-22 DIAGNOSIS — K089 Disorder of teeth and supporting structures, unspecified: Secondary | ICD-10-CM | POA: Insufficient documentation

## 2012-04-22 DIAGNOSIS — K0889 Other specified disorders of teeth and supporting structures: Secondary | ICD-10-CM

## 2012-04-22 DIAGNOSIS — Z79899 Other long term (current) drug therapy: Secondary | ICD-10-CM | POA: Insufficient documentation

## 2012-04-22 DIAGNOSIS — F17200 Nicotine dependence, unspecified, uncomplicated: Secondary | ICD-10-CM | POA: Insufficient documentation

## 2012-04-22 DIAGNOSIS — F411 Generalized anxiety disorder: Secondary | ICD-10-CM | POA: Insufficient documentation

## 2012-04-22 HISTORY — DX: Pure hypercholesterolemia, unspecified: E78.00

## 2012-04-22 LAB — URINE MICROSCOPIC-ADD ON

## 2012-04-22 LAB — URINALYSIS, ROUTINE W REFLEX MICROSCOPIC
Bilirubin Urine: NEGATIVE
Glucose, UA: NEGATIVE mg/dL
Ketones, ur: 15 mg/dL — AB
Leukocytes, UA: NEGATIVE
Specific Gravity, Urine: 1.015 (ref 1.005–1.030)
pH: 5.5 (ref 5.0–8.0)

## 2012-04-22 MED ORDER — IBUPROFEN 400 MG PO TABS
600.0000 mg | ORAL_TABLET | Freq: Once | ORAL | Status: DC
  Filled 2012-04-22: qty 2

## 2012-04-22 NOTE — ED Provider Notes (Signed)
History     CSN: [id number]  Arrival date & time 04/22/12  1300   First MD Initiated Contact with Patient 04/22/12 1313      Chief Complaint  Patient presents with  . Dental Pain  . Weakness     Patient is a 42 y.o. female presenting with tooth pain. The history is provided by the patient.  Dental PainThe primary symptoms include mouth pain. Primary symptoms do not include fever. The symptoms began 2 days ago. The symptoms are unchanged. The symptoms are chronic. The symptoms occur constantly.  Additional symptoms do not include: facial swelling.  pt reports dental pain for 2 days.  No fever/vomiting  She also reports fatigue, but no syncope/headache/cp.  No dysuria.  No vag bleeding.  No rectal bleeding.  No focal weakness.    Past Medical History  Diagnosis Date  . Anxiety   . Bipolar 1 disorder   . Depression   . Liver disease   . Diabetes mellitus   . High cholesterol     History reviewed. No pertinent past surgical history.  No family history on file.  History  Substance Use Topics  . Smoking status: Current Every Day Smoker -- 1.00 packs/day for 15 years    Types: Cigarettes  . Smokeless tobacco: Not on file  . Alcohol Use: 3.6 oz/week    6 Cans of beer per week     Comment: occasionally    OB History   Grav Para Term Preterm Abortions TAB SAB Ect Mult Living                  Review of Systems  Constitutional: Negative for fever.  HENT: Negative for facial swelling.   Cardiovascular: Negative for chest pain.  Gastrointestinal: Negative for blood in stool.  Genitourinary: Negative for vaginal bleeding.  Psychiatric/Behavioral: Negative for agitation.    Allergies  Codeine  Home Medications   Current Outpatient Rx  Name  Route  Sig  Dispense  Refill  . FLUoxetine (PROZAC) 10 MG capsule   Oral   Take 10 mg by mouth daily.         . metFORMIN (GLUCOPHAGE) 500 MG tablet   Oral   Take 500 mg by mouth 2 (two) times daily.          .  penicillin v potassium (VEETID) 500 MG tablet   Oral   Take 500 mg by mouth 4 (four) times daily.         . traZODone (DESYREL) 100 MG tablet   Oral   Take 100 mg by mouth at bedtime as needed. Sleep         . albuterol (PROVENTIL HFA;VENTOLIN HFA) 108 (90 BASE) MCG/ACT inhaler   Inhalation   Inhale 2 puffs into the lungs every 6 (six) hours as needed. Shortness of Breath/COPD         . gabapentin (NEURONTIN) 100 MG capsule   Oral   Take 2 capsules (200 mg total) by mouth 3 (three) times daily.   180 capsule   0     BP 128/73  Pulse 102  Temp(Src) 97.8 F (36.6 C) (Oral)  Ht 5' 4.5" (1.638 m)  Wt 190 lb (86.183 kg)  BMI 32.12 kg/m2  SpO2 97%  LMP 04/15/2012  Physical Exam CONSTITUTIONAL: Well developed/well nourished HEAD AND FACE: Normocephalic/atraumatic EYES: EOMI/PERRL ENMT: Mucous membranes moist.  Poor dentition.  No trismus.  No focal abscess noted. NECK: supple no meningeal signs CV: S1/S2 noted, no  murmurs/rubs/gallops noted LUNGS: Lungs are clear to auscultation bilaterally, no apparent distress ABDOMEN: soft, nontender, no rebound or guarding NEURO: Pt is awake/alert, moves all extremitiesx4 No ataxia No focal weakness noted EXTREMITIES:full ROM SKIN: warm, color normal  ED Course  Procedures  Labs Reviewed  URINALYSIS, ROUTINE W REFLEX MICROSCOPIC - Abnormal; Notable for the following:    Hgb urine dipstick MODERATE (*)    Ketones, ur 15 (*)    All other components within normal limits  URINE MICROSCOPIC-ADD ON - Abnormal; Notable for the following:    Squamous Epithelial / LPF MANY (*)    Bacteria, UA FEW (*)    All other components within normal limits  POCT PREGNANCY, URINE     1. Pain, dental       MDM  Nursing notes including past medical history and social history reviewed and considered in documentation Labs/vital reviewed and considered  Advised f/u with dental for her dental pain.  She already has PCN from previous visit  that is not expired I advised her to continue this med         Joya Gaskins, MD 04/22/12 1531

## 2012-04-22 NOTE — ED Notes (Signed)
C/o right upper dental pain x 2 days; reports fatigue/generalized weakness onset this morning; pt ambulated to tx area from waiting room - gait steady. A&ox4; answers questions appropriately

## 2012-04-22 NOTE — ED Notes (Signed)
RN observed patient leaving room and walking down hallway. RN spoke with patient and informed her that she would be discharge in a few minutes. Patient stating she is "going outside for a second" Informed patient that she is unable to leave until discharge. Patient refusing to wait, even when RN stated it would be just a minute before I was available to discharge her. Left without waiting for discharge instructions and did not sign out.

## 2012-04-22 NOTE — ED Notes (Signed)
Pt ambulated to restroom at this time without difficulty pt obtained urine sample also sent to lab at this time. Duong Haydel

## 2012-04-22 NOTE — ED Notes (Signed)
In to medicate patient. Patient not found in room. Belongings remain at bedside. Will attempt to medicate when patient returns to room.

## 2012-04-26 ENCOUNTER — Emergency Department (HOSPITAL_COMMUNITY)
Admission: EM | Admit: 2012-04-26 | Discharge: 2012-04-27 | Disposition: A | Discharge status: Home / Self Care | Payer: Self-pay | Attending: Emergency Medicine | Admitting: Emergency Medicine

## 2012-04-26 ENCOUNTER — Emergency Department (HOSPITAL_COMMUNITY): Payer: Self-pay

## 2012-04-26 ENCOUNTER — Encounter (HOSPITAL_COMMUNITY): Payer: Self-pay | Admitting: *Deleted

## 2012-04-26 DIAGNOSIS — R110 Nausea: Secondary | ICD-10-CM | POA: Insufficient documentation

## 2012-04-26 DIAGNOSIS — F411 Generalized anxiety disorder: Secondary | ICD-10-CM | POA: Insufficient documentation

## 2012-04-26 DIAGNOSIS — F17200 Nicotine dependence, unspecified, uncomplicated: Secondary | ICD-10-CM | POA: Insufficient documentation

## 2012-04-26 DIAGNOSIS — Z862 Personal history of diseases of the blood and blood-forming organs and certain disorders involving the immune mechanism: Secondary | ICD-10-CM | POA: Insufficient documentation

## 2012-04-26 DIAGNOSIS — Z8639 Personal history of other endocrine, nutritional and metabolic disease: Secondary | ICD-10-CM | POA: Insufficient documentation

## 2012-04-26 DIAGNOSIS — R42 Dizziness and giddiness: Secondary | ICD-10-CM | POA: Insufficient documentation

## 2012-04-26 DIAGNOSIS — R209 Unspecified disturbances of skin sensation: Secondary | ICD-10-CM | POA: Insufficient documentation

## 2012-04-26 DIAGNOSIS — Z79899 Other long term (current) drug therapy: Secondary | ICD-10-CM | POA: Insufficient documentation

## 2012-04-26 DIAGNOSIS — Z8719 Personal history of other diseases of the digestive system: Secondary | ICD-10-CM | POA: Insufficient documentation

## 2012-04-26 DIAGNOSIS — F319 Bipolar disorder, unspecified: Secondary | ICD-10-CM | POA: Insufficient documentation

## 2012-04-26 DIAGNOSIS — E119 Type 2 diabetes mellitus without complications: Secondary | ICD-10-CM | POA: Insufficient documentation

## 2012-04-26 DIAGNOSIS — Z3202 Encounter for pregnancy test, result negative: Secondary | ICD-10-CM | POA: Insufficient documentation

## 2012-04-26 LAB — COMPREHENSIVE METABOLIC PANEL
ALT: 29 U/L (ref 0–35)
AST: 28 U/L (ref 0–37)
Albumin: 3.9 g/dL (ref 3.5–5.2)
Alkaline Phosphatase: 107 U/L (ref 39–117)
Calcium: 9 mg/dL (ref 8.4–10.5)
Glucose, Bld: 168 mg/dL — ABNORMAL HIGH (ref 70–99)
Potassium: 4.3 mEq/L (ref 3.5–5.1)
Sodium: 134 mEq/L — ABNORMAL LOW (ref 135–145)
Total Protein: 8 g/dL (ref 6.0–8.3)

## 2012-04-26 LAB — URINALYSIS, ROUTINE W REFLEX MICROSCOPIC
Bilirubin Urine: NEGATIVE
Glucose, UA: NEGATIVE mg/dL
Hgb urine dipstick: NEGATIVE
Nitrite: NEGATIVE
Specific Gravity, Urine: >1.03 — ABNORMAL HIGH (ref 1.005–1.030)
pH: 5.5 (ref 5.0–8.0)

## 2012-04-26 LAB — CBC WITH DIFFERENTIAL/PLATELET
Basophils Absolute: 0 10*3/uL (ref 0.0–0.1)
Eosinophils Absolute: 0 10*3/uL (ref 0.0–0.7)
Eosinophils Relative: 1 % (ref 0–5)
Lymphs Abs: 2.1 10*3/uL (ref 0.7–4.0)
MCH: 27.1 pg (ref 26.0–34.0)
MCV: 82.7 fL (ref 78.0–100.0)
Neutrophils Relative %: 58 % (ref 43–77)
Platelets: 397 10*3/uL (ref 150–400)
RBC: 4.46 MIL/uL (ref 3.87–5.11)
RDW: 17.4 % — ABNORMAL HIGH (ref 11.5–15.5)
WBC: 6.2 10*3/uL (ref 4.0–10.5)

## 2012-04-26 NOTE — ED Notes (Signed)
C/o dizziness, feeling hot and nausea onset about an hour ago

## 2012-04-26 NOTE — ED Provider Notes (Signed)
History     CSN: [id number]  Arrival date & time 04/26/12  2104   First MD Initiated Contact with Patient 04/26/12 2120      Chief Complaint  Patient presents with  . Dizziness    (Consider location/radiation/quality/duration/timing/severity/associated sxs/prior treatment) Patient is a 42 y.o. female presenting with neurologic complaint. The history is provided by the patient.  Neurologic Problem The primary symptoms include dizziness and nausea. Primary symptoms do not include headaches. The symptoms began less than 1 hour ago. The symptoms are unchanged. The symptoms occurred after standing up.  Dizziness also occurs with nausea.  Additional symptoms include anxiety.  Julie Burnett is a 42 y.o. female who presents to the ED with dizziness. The symptoms started about one hour prior to coming to the hospital. Associated symptoms include feeling hot, nausea and numbness in both arms.   Past Medical History  Diagnosis Date  . Anxiety   . Bipolar 1 disorder   . Depression   . Liver disease   . Diabetes mellitus   . High cholesterol     History reviewed. No pertinent past surgical history.  No family history on file.  History  Substance Use Topics  . Smoking status: Current Every Day Smoker -- 1.00 packs/day for 15 years    Types: Cigarettes  . Smokeless tobacco: Not on file  . Alcohol Use: 3.6 oz/week    6 Cans of beer per week     Comment: occasionally    OB History   Grav Para Term Preterm Abortions TAB SAB Ect Mult Living                  Review of Systems  Respiratory: Negative for shortness of breath.   Cardiovascular: Negative for chest pain and leg swelling.  Gastrointestinal: Positive for nausea.  Genitourinary: Negative for dysuria, urgency and frequency.  Skin: Negative for rash.  Allergic/Immunologic: Negative for environmental allergies and food allergies.  Neurological: Positive for dizziness and light-headedness. Negative for syncope and  headaches.  Psychiatric/Behavioral: Negative for confusion. The patient is not nervous/anxious.     Allergies  Codeine  Home Medications   Current Outpatient Rx  Name  Route  Sig  Dispense  Refill  . albuterol (PROVENTIL HFA;VENTOLIN HFA) 108 (90 BASE) MCG/ACT inhaler   Inhalation   Inhale 2 puffs into the lungs every 6 (six) hours as needed. Shortness of Breath/COPD         . FLUoxetine (PROZAC) 10 MG capsule   Oral   Take 10 mg by mouth daily.         Marland Kitchen gabapentin (NEURONTIN) 100 MG capsule   Oral   Take 2 capsules (200 mg total) by mouth 3 (three) times daily.   180 capsule   0   . metFORMIN (GLUCOPHAGE) 500 MG tablet   Oral   Take 500 mg by mouth 2 (two) times daily.          . penicillin v potassium (VEETID) 500 MG tablet   Oral   Take 500 mg by mouth 4 (four) times daily.         . traZODone (DESYREL) 100 MG tablet   Oral   Take 100 mg by mouth at bedtime as needed. Sleep           BP 139/94  Pulse 94  Temp(Src) 97.6 F (36.4 C)  Ht 5\' 4"  (1.626 m)  Wt 190 lb (86.183 kg)  BMI 32.6 kg/m2  SpO2 95%  LMP  04/15/2012  Physical Exam  Nursing note and vitals reviewed. Constitutional: She is oriented to person, place, and time. She appears well-developed and well-nourished.  HENT:  Head: Normocephalic.  Right Ear: Tympanic membrane normal.  Left Ear: Tympanic membrane normal.  Nose: Nose normal.  Mouth/Throat: Uvula is midline, oropharynx is clear and moist and mucous membranes are normal.  Eyes: EOM are normal.  Neck: Neck supple.  Cardiovascular: Normal rate, regular rhythm and normal heart sounds.   Pulmonary/Chest: Effort normal and breath sounds normal.  Abdominal: Soft. There is no tenderness. There is no CVA tenderness.  Musculoskeletal: Normal range of motion.  Neurological: She is alert and oriented to person, place, and time. She has normal strength and normal reflexes. No cranial nerve deficit or sensory deficit. She displays a  negative Romberg sign.  Radial pulses strong and equal bilateral. Ambulatory with steady gait.  Skin: Skin is warm and dry.  Psychiatric: She has a normal mood and affect. Her behavior is normal. Judgment and thought content normal.   Results for orders placed during the hospital encounter of 04/26/12 (from the past 24 hour(s))  GLUCOSE, CAPILLARY     Status: Abnormal   Collection Time    04/26/12  9:19 PM      Result Value Range   Glucose-Capillary 177 (*) 70 - 99 mg/dL  CBC WITH DIFFERENTIAL     Status: Abnormal   Collection Time    04/26/12  9:46 PM      Result Value Range   WBC 6.2  4.0 - 10.5 K/uL   RBC 4.46  3.87 - 5.11 MIL/uL   Hemoglobin 12.1  12.0 - 15.0 g/dL   HCT 14.7  82.9 - 56.2 %   MCV 82.7  78.0 - 100.0 fL   MCH 27.1  26.0 - 34.0 pg   MCHC 32.8  30.0 - 36.0 g/dL   RDW 13.0 (*) 86.5 - 78.4 %   Platelets 397  150 - 400 K/uL   Neutrophils Relative 58  43 - 77 %   Neutro Abs 3.7  1.7 - 7.7 K/uL   Lymphocytes Relative 34  12 - 46 %   Lymphs Abs 2.1  0.7 - 4.0 K/uL   Monocytes Relative 7  3 - 12 %   Monocytes Absolute 0.4  0.1 - 1.0 K/uL   Eosinophils Relative 1  0 - 5 %   Eosinophils Absolute 0.0  0.0 - 0.7 K/uL   Basophils Relative 0  0 - 1 %   Basophils Absolute 0.0  0.0 - 0.1 K/uL  COMPREHENSIVE METABOLIC PANEL     Status: Abnormal   Collection Time    04/26/12  9:46 PM      Result Value Range   Sodium 134 (*) 135 - 145 mEq/L   Potassium 4.3  3.5 - 5.1 mEq/L   Chloride 96  96 - 112 mEq/L   CO2 23  19 - 32 mEq/L   Glucose, Bld 168 (*) 70 - 99 mg/dL   BUN 6  6 - 23 mg/dL   Creatinine, Ser 6.96  0.50 - 1.10 mg/dL   Calcium 9.0  8.4 - 29.5 mg/dL   Total Protein 8.0  6.0 - 8.3 g/dL   Albumin 3.9  3.5 - 5.2 g/dL   AST 28  0 - 37 U/L   ALT 29  0 - 35 U/L   Alkaline Phosphatase 107  39 - 117 U/L   Total Bilirubin 0.1 (*) 0.3 - 1.2 mg/dL  GFR calc non Af Amer >90  >90 mL/min   GFR calc Af Amer >90  >90 mL/min  URINALYSIS, ROUTINE W REFLEX MICROSCOPIC      Status: Abnormal   Collection Time    04/26/12 10:24 PM      Result Value Range   Color, Urine YELLOW  YELLOW   APPearance CLEAR  CLEAR   Specific Gravity, Urine >1.030 (*) 1.005 - 1.030   pH 5.5  5.0 - 8.0   Glucose, UA NEGATIVE  NEGATIVE mg/dL   Hgb urine dipstick NEGATIVE  NEGATIVE   Bilirubin Urine NEGATIVE  NEGATIVE   Ketones, ur TRACE (*) NEGATIVE mg/dL   Protein, ur NEGATIVE  NEGATIVE mg/dL   Urobilinogen, UA 0.2  0.0 - 1.0 mg/dL   Nitrite NEGATIVE  NEGATIVE   Leukocytes, UA NEGATIVE  NEGATIVE  POCT PREGNANCY, URINE     Status: None   Collection Time    04/26/12 10:28 PM      Result Value Range   Preg Test, Ur NEGATIVE  NEGATIVE    Patient's O2 Sat dropped to 90% on room air so she was placed on O2 per [place] and it immediately went to 100%.  Discussed with Dr. Deretha Emory and chest x-ray ordered.   Dg Chest 2 View  04/27/2012  *RADIOLOGY REPORT*  Clinical Data: Shortness of breath.  CHEST - 2 VIEW  Comparison: 05/25/2008  Findings: The heart size and pulmonary vascularity are normal and the lungs are clear.  No osseous abnormality.  IMPRESSION: Normal chest.   Original Report Authenticated By: Francene Boyers, M.D.     Patient off O2 and saturation remains in the upper 90's. Patient without respiratory symptoms. Patient is a 1 ppd smoker x 15 years.   Patient returned from x-ray and she is ambulatory in the exam room with steady gait and does not appear to have any problems. Her friend is concerned that the patient continues to feel dizzy although all her other symptoms have gone. I discussed findings with the patient and she is concerned because there is no specific diagnosis causing her dizziness. I explained to the patient that the most common cause of dizziness is usually viral or inner ear problems. Her TM's are normal and her neurological exam is normal. I encouraged the patient to call her PCP in the morning for follow up.   Assessment: 42 y.o. female with  dizziness  Plan:  Discussed with Dr. Deretha Emory, with normal EKG, CXR and O2 Sat stable off O2 will d/c   patient home to follow up with her PCP tomorrow.   Meclizine Rx  given  Procedures (including critical care time)   MDM  EKG: {ekg findings:315101::"normal EKG, normal sinus rhythm",  I have reviewed this patient's vital signs, nurses notes, appropriate labs and imaging.  I have discussed in detail with the patient results and plan of care. Patient voices understanding.    Medication List    TAKE these medications       meclizine 25 MG tablet  Commonly known as:  ANTIVERT  Take 1 tablet (25 mg total) by mouth 3 (three) times daily as needed.      ASK your doctor about these medications       albuterol 108 (90 BASE) MCG/ACT inhaler  Commonly known as:  PROVENTIL HFA;VENTOLIN HFA  Inhale 2 puffs into the lungs every 6 (six) hours as needed. Shortness of Breath/COPD     FLUoxetine 10 MG capsule  Commonly known as:  PROZAC  Take 10 mg by mouth daily.     gabapentin 300 MG capsule  Commonly known as:  NEURONTIN  Take 300 mg by mouth daily.     metFORMIN 500 MG tablet  Commonly known as:  GLUCOPHAGE  Take 500 mg by mouth 2 (two) times daily.     penicillin v potassium 500 MG tablet  Commonly known as:  VEETID  Take 500 mg by mouth 4 (four) times daily.     traZODone 150 MG tablet  Commonly known as:  DESYREL  Take 150 mg by mouth at bedtime.             [facility] Orlene Och, [place] 04/27/12 [phone]

## 2012-04-27 MED ORDER — MECLIZINE HCL 25 MG PO TABS: 25.0000 mg | ORAL_TABLET | Freq: Three times a day (TID) | ORAL | Status: DC | PRN

## 2012-04-27 MED ORDER — MECLIZINE HCL 12.5 MG PO TABS
ORAL_TABLET | ORAL | Status: AC
  Filled 2012-04-27: qty 2

## 2012-04-27 MED ORDER — MECLIZINE HCL 12.5 MG PO TABS
25.0000 mg | ORAL_TABLET | Freq: Once | ORAL | Status: AC
  Administered 2012-04-27: 25 mg via ORAL

## 2012-04-27 NOTE — ED Notes (Signed)
Removed nasal canula to monitor patient's O2.

## 2012-04-27 NOTE — ED Provider Notes (Signed)
Medical screening examination/treatment/procedure(s) were performed by non-physician practitioner and as supervising physician I was immediately available for consultation/collaboration.    Shelda Jakes, MD 04/27/12 [phone]

## 2012-07-03 ENCOUNTER — Emergency Department (HOSPITAL_COMMUNITY): Payer: Self-pay

## 2012-07-03 ENCOUNTER — Encounter (HOSPITAL_COMMUNITY): Payer: Self-pay | Admitting: *Deleted

## 2012-07-03 ENCOUNTER — Emergency Department (HOSPITAL_COMMUNITY)
Admission: EM | Admit: 2012-07-03 | Discharge: 2012-07-03 | Disposition: A | Discharge status: Home / Self Care | Payer: Self-pay | Attending: Emergency Medicine | Admitting: Emergency Medicine

## 2012-07-03 DIAGNOSIS — S20211A Contusion of right front wall of thorax, initial encounter: Secondary | ICD-10-CM

## 2012-07-03 DIAGNOSIS — R05 Cough: Secondary | ICD-10-CM | POA: Insufficient documentation

## 2012-07-03 DIAGNOSIS — R296 Repeated falls: Secondary | ICD-10-CM | POA: Insufficient documentation

## 2012-07-03 DIAGNOSIS — F411 Generalized anxiety disorder: Secondary | ICD-10-CM | POA: Insufficient documentation

## 2012-07-03 DIAGNOSIS — R059 Cough, unspecified: Secondary | ICD-10-CM | POA: Insufficient documentation

## 2012-07-03 DIAGNOSIS — Z8639 Personal history of other endocrine, nutritional and metabolic disease: Secondary | ICD-10-CM | POA: Insufficient documentation

## 2012-07-03 DIAGNOSIS — S20219A Contusion of unspecified front wall of thorax, initial encounter: Secondary | ICD-10-CM | POA: Insufficient documentation

## 2012-07-03 DIAGNOSIS — Y9289 Other specified places as the place of occurrence of the external cause: Secondary | ICD-10-CM | POA: Insufficient documentation

## 2012-07-03 DIAGNOSIS — Z3202 Encounter for pregnancy test, result negative: Secondary | ICD-10-CM | POA: Insufficient documentation

## 2012-07-03 DIAGNOSIS — F17200 Nicotine dependence, unspecified, uncomplicated: Secondary | ICD-10-CM | POA: Insufficient documentation

## 2012-07-03 DIAGNOSIS — Z79899 Other long term (current) drug therapy: Secondary | ICD-10-CM | POA: Insufficient documentation

## 2012-07-03 DIAGNOSIS — E119 Type 2 diabetes mellitus without complications: Secondary | ICD-10-CM | POA: Insufficient documentation

## 2012-07-03 DIAGNOSIS — Z862 Personal history of diseases of the blood and blood-forming organs and certain disorders involving the immune mechanism: Secondary | ICD-10-CM | POA: Insufficient documentation

## 2012-07-03 DIAGNOSIS — F319 Bipolar disorder, unspecified: Secondary | ICD-10-CM | POA: Insufficient documentation

## 2012-07-03 DIAGNOSIS — K089 Disorder of teeth and supporting structures, unspecified: Secondary | ICD-10-CM | POA: Insufficient documentation

## 2012-07-03 DIAGNOSIS — Z8719 Personal history of other diseases of the digestive system: Secondary | ICD-10-CM | POA: Insufficient documentation

## 2012-07-03 DIAGNOSIS — Y9389 Activity, other specified: Secondary | ICD-10-CM | POA: Insufficient documentation

## 2012-07-03 LAB — GLUCOSE, CAPILLARY: Glucose-Capillary: 180 mg/dL — ABNORMAL HIGH (ref 70–99)

## 2012-07-03 LAB — URINALYSIS, ROUTINE W REFLEX MICROSCOPIC
Hgb urine dipstick: NEGATIVE
Nitrite: NEGATIVE
Protein, ur: NEGATIVE mg/dL
Urobilinogen, UA: 0.2 mg/dL (ref 0.0–1.0)

## 2012-07-03 LAB — PREGNANCY, URINE: Preg Test, Ur: NEGATIVE

## 2012-07-03 LAB — URINE MICROSCOPIC-ADD ON

## 2012-07-03 MED ORDER — CYCLOBENZAPRINE HCL 10 MG PO TABS: 10.0000 mg | ORAL_TABLET | Freq: Two times a day (BID) | ORAL | Status: DC | PRN

## 2012-07-03 MED ORDER — OXYCODONE-ACETAMINOPHEN 5-325 MG PO TABS: 2.0000 | ORAL_TABLET | ORAL | Status: DC | PRN

## 2012-07-03 MED ORDER — HYDROMORPHONE HCL PF 1 MG/ML IJ SOLN
1.0000 mg | Freq: Once | INTRAMUSCULAR | Status: AC
  Administered 2012-07-03: 1 mg via INTRAMUSCULAR
  Filled 2012-07-03: qty 1

## 2012-07-03 MED ORDER — NAPROXEN 500 MG PO TABS: 500.0000 mg | ORAL_TABLET | Freq: Two times a day (BID) | ORAL | Status: DC

## 2012-07-03 NOTE — ED Notes (Addendum)
Pt fell two night ago while getting out of shower due to wet floor, landed beside the commode, pt c/o right rib/ flank  Area pain that is worse with palpation, coughing, denies any SOB or LOC  pt also c/o right upper toothache that started a week ago, bruising noted to right flank area.

## 2012-07-03 NOTE — ED Provider Notes (Signed)
History  This chart was scribed for Donnetta Hutching, MD by Manuela Schwartz, ED scribe. This patient was seen in room [facility]03/[facility] and the patient's care was started at 1807.   CSN: [id number]  Arrival date & time 07/03/12  1807   First MD Initiated Contact with Patient 07/03/12 1837      Chief Complaint  Patient presents with  . Fall  . Dental Pain   Patient is a 42 y.o. female presenting with fall and tooth pain. The history is provided by the patient. No language interpreter was used.  Fall This is a new problem. The current episode started 2 days ago. The problem occurs constantly. The problem has not changed since onset.Pertinent negatives include no shortness of breath. Associated symptoms comments: Right rib pain. The symptoms are aggravated by coughing (deep breaths). Nothing relieves the symptoms. She has tried nothing for the symptoms.  Dental Pain Associated symptoms: no fever    HPI Comments: Julie Burnett is a 42 y.o. female who presents to the Emergency Department complaining of acute right sided rib pain after she fell while getting out of the shower x2 nights ago. She states bruising to the area of pain which is worsened with deep breaths or coughing. She denies LOC or SOB. She has not taken any medicines for this problem.  She also c/o right upper dental pain which began x1 week ago, she states has an appointment next week to see the dentist.      Past Medical History  Diagnosis Date  . Anxiety   . Bipolar 1 disorder   . Depression   . Liver disease   . Diabetes mellitus   . High cholesterol     History reviewed. No pertinent past surgical history.  No family history on file.  History  Substance Use Topics  . Smoking status: Current Every Day Smoker -- 1.00 packs/day for 15 years    Types: Cigarettes  . Smokeless tobacco: Not on file  . Alcohol Use: 3.6 oz/week    6 Cans of beer per week     Comment: occasionally    OB History   Grav Para Term Preterm  Abortions TAB SAB Ect Mult Living                  Review of Systems  Constitutional: Negative for fever and chills.  HENT: Positive for dental problem (right upper dental pain).   Respiratory: Negative for shortness of breath.   Gastrointestinal: Negative for nausea and vomiting.  Musculoskeletal:       Right rib pain  Neurological: Negative for weakness.  All other systems reviewed and are negative.   A complete 10 system review of systems was obtained and all systems are negative except as noted in the HPI and PMH.   Allergies  Codeine  Home Medications   Current Outpatient Rx  Name  Route  Sig  Dispense  Refill  . FLUoxetine (PROZAC) 10 MG capsule   Oral   Take 10 mg by mouth daily.         Marland Kitchen gabapentin (NEURONTIN) 300 MG capsule   Oral   Take 300 mg by mouth 2 (two) times daily.          . meclizine (ANTIVERT) 25 MG tablet   Oral   Take 25 mg by mouth 3 (three) times daily as needed for dizziness.         . metFORMIN (GLUCOPHAGE) 500 MG tablet   Oral  Take 500 mg by mouth 2 (two) times daily.          . traZODone (DESYREL) 150 MG tablet   Oral   Take 150 mg by mouth at bedtime.         Marland Kitchen albuterol (PROVENTIL HFA;VENTOLIN HFA) 108 (90 BASE) MCG/ACT inhaler   Inhalation   Inhale 2 puffs into the lungs every 6 (six) hours as needed. Shortness of Breath/COPD           Triage Vitals: BP 106/74  Pulse 95  Temp(Src) 98.7 F (37.1 C)  Resp 20  Ht 5\' 4"  (1.626 m)  Wt 191 lb (86.637 kg)  BMI 32.77 kg/m2  SpO2 96%  LMP 06/23/2012  Physical Exam  Nursing note and vitals reviewed. Constitutional: She is oriented to person, place, and time. She appears well-developed and well-nourished.  HENT:  Head: Normocephalic and atraumatic.  Eyes: Conjunctivae and EOM are normal. Pupils are equal, round, and reactive to light.  Neck: Normal range of motion. Neck supple.  Cardiovascular: Normal rate, regular rhythm and normal heart sounds.    Pulmonary/Chest: Effort normal and breath sounds normal.  Abdominal: Soft. Bowel sounds are normal.  Musculoskeletal: Normal range of motion. She exhibits tenderness (Ecchymosis/tenderness right inferior lateral chest wall).  Neurological: She is alert and oriented to person, place, and time.  Skin: Skin is warm and dry.  Psychiatric: She has a normal mood and affect.    ED Course  Procedures (including critical care time) DIAGNOSTIC STUDIES: Oxygen Saturation is 96% on room air, adequate by my interpretation.    COORDINATION OF CARE: At 715 PM Discussed treatment plan with patient which includes UA, right ribs X-ray. Patient agrees.   Labs Reviewed  GLUCOSE, CAPILLARY - Abnormal; Notable for the following:    Glucose-Capillary 180 (*)    All other components within normal limits  URINALYSIS, ROUTINE W REFLEX MICROSCOPIC  PREGNANCY, URINE   No results found. Results for orders placed during the hospital encounter of 07/03/12  GLUCOSE, CAPILLARY      Result Value Range   Glucose-Capillary 180 (*) 70 - 99 mg/dL   No results found. Dg Ribs Unilateral W/chest Right  07/03/2012   *RADIOLOGY REPORT*  Clinical Data: Posterior rib pain and bruising secondary to a fall yesterday.  RIGHT RIBS AND CHEST - 3+ VIEW  Comparison: 04/27/2012  Findings: There is no acute right rib fracture.  There is an old fracture of the anterior lateral aspect of the right third rib. There is no pneumothorax or pleural effusion or lung contusion. Heart size and vascularity are normal.  Lungs are clear.  IMPRESSION: No acute abnormalities.   Original Report Authenticated By: Francene Boyers, M.D.     No diagnosis found.    MDM    x-ray of right ribs shows no fracture.  Urine culture pending.  Patient has no dysuria.  Prescription for Naprosyn and Flexeril    I personally performed the services described in this documentation, which was scribed in my presence. The recorded information has been reviewed  and is accurate.          Donnetta Hutching, MD 07/03/12 [phone]

## 2012-07-03 NOTE — ED Notes (Signed)
Pt complaining of right side rib pain from a fall 2 days ago. Pt also having right upper dental pain, states has a appointment next week to see dentist.

## 2012-07-03 NOTE — ED Notes (Signed)
Pt alert & oriented x4, stable gait. Patient given discharge instructions, paperwork & prescription(s). Patient  instructed to stop at the registration desk to finish any additional paperwork. Patient verbalized understanding. Pt left department w/ no further questions. 

## 2012-07-05 LAB — URINE CULTURE: Colony Count: NO GROWTH

## 2012-07-11 ENCOUNTER — Encounter (HOSPITAL_COMMUNITY): Payer: Self-pay | Admitting: *Deleted

## 2012-07-11 ENCOUNTER — Emergency Department (HOSPITAL_COMMUNITY)
Admission: EM | Admit: 2012-07-11 | Discharge: 2012-07-11 | Disposition: A | Discharge status: Home / Self Care | Payer: Self-pay | Attending: Emergency Medicine | Admitting: Emergency Medicine

## 2012-07-11 DIAGNOSIS — F411 Generalized anxiety disorder: Secondary | ICD-10-CM | POA: Insufficient documentation

## 2012-07-11 DIAGNOSIS — E119 Type 2 diabetes mellitus without complications: Secondary | ICD-10-CM | POA: Insufficient documentation

## 2012-07-11 DIAGNOSIS — K029 Dental caries, unspecified: Secondary | ICD-10-CM | POA: Insufficient documentation

## 2012-07-11 DIAGNOSIS — K0889 Other specified disorders of teeth and supporting structures: Secondary | ICD-10-CM

## 2012-07-11 DIAGNOSIS — Y929 Unspecified place or not applicable: Secondary | ICD-10-CM | POA: Insufficient documentation

## 2012-07-11 DIAGNOSIS — Z8719 Personal history of other diseases of the digestive system: Secondary | ICD-10-CM | POA: Insufficient documentation

## 2012-07-11 DIAGNOSIS — F329 Major depressive disorder, single episode, unspecified: Secondary | ICD-10-CM | POA: Insufficient documentation

## 2012-07-11 DIAGNOSIS — W19XXXA Unspecified fall, initial encounter: Secondary | ICD-10-CM | POA: Insufficient documentation

## 2012-07-11 DIAGNOSIS — F3289 Other specified depressive episodes: Secondary | ICD-10-CM | POA: Insufficient documentation

## 2012-07-11 DIAGNOSIS — Z79899 Other long term (current) drug therapy: Secondary | ICD-10-CM | POA: Insufficient documentation

## 2012-07-11 DIAGNOSIS — Y939 Activity, unspecified: Secondary | ICD-10-CM | POA: Insufficient documentation

## 2012-07-11 DIAGNOSIS — F17200 Nicotine dependence, unspecified, uncomplicated: Secondary | ICD-10-CM | POA: Insufficient documentation

## 2012-07-11 DIAGNOSIS — F319 Bipolar disorder, unspecified: Secondary | ICD-10-CM | POA: Insufficient documentation

## 2012-07-11 DIAGNOSIS — K089 Disorder of teeth and supporting structures, unspecified: Secondary | ICD-10-CM | POA: Insufficient documentation

## 2012-07-11 DIAGNOSIS — S20211D Contusion of right front wall of thorax, subsequent encounter: Secondary | ICD-10-CM

## 2012-07-11 DIAGNOSIS — E7800 Pure hypercholesterolemia, unspecified: Secondary | ICD-10-CM | POA: Insufficient documentation

## 2012-07-11 DIAGNOSIS — S20219A Contusion of unspecified front wall of thorax, initial encounter: Secondary | ICD-10-CM | POA: Insufficient documentation

## 2012-07-11 MED ORDER — CYCLOBENZAPRINE HCL 10 MG PO TABS
10.0000 mg | ORAL_TABLET | Freq: Once | ORAL | Status: AC
  Administered 2012-07-11: 10 mg via ORAL
  Filled 2012-07-11: qty 1

## 2012-07-11 MED ORDER — HYDROCODONE-ACETAMINOPHEN 5-325 MG PO TABS
1.0000 | ORAL_TABLET | Freq: Once | ORAL | Status: AC
  Administered 2012-07-11: 1 via ORAL
  Filled 2012-07-11: qty 1

## 2012-07-11 MED ORDER — AMOXICILLIN 250 MG PO CAPS
500.0000 mg | ORAL_CAPSULE | Freq: Once | ORAL | Status: AC
  Administered 2012-07-11: 500 mg via ORAL
  Filled 2012-07-11: qty 2

## 2012-07-11 MED ORDER — IBUPROFEN 800 MG PO TABS: 800.0000 mg | ORAL_TABLET | Freq: Three times a day (TID) | ORAL | Status: DC

## 2012-07-11 MED ORDER — AMOXICILLIN 500 MG PO CAPS: 500.0000 mg | ORAL_CAPSULE | Freq: Three times a day (TID) | ORAL | Status: DC

## 2012-07-11 NOTE — ED Provider Notes (Signed)
History    CSN: [id number] Arrival date & time 07/11/12  7829  First MD Initiated Contact with Patient 07/11/12 [phone]     Chief Complaint  Patient presents with  . Dental Pain  . Chest Pain   (Consider location/radiation/quality/duration/timing/severity/associated sxs/prior Treatment) HPI Comments: Julie Burnett is a 42 y.o. female who presents to the Emergency Department complaining of right upper dental pain and right rib pain.  She was seen here on 07/03/12 for same and returns today stating that she missed her dental appt and has ran out of her pain medication.  She also c/o continued pain to her right ribs that began after a fall.  Had negative x-rays from the her previous visit.  States pain today is similar to previous.  She denies facial swelling, difficulty swallowing, fever, shortness of breath or abdominal pain    The history is provided by the patient.   Past Medical History  Diagnosis Date  . Anxiety   . Bipolar 1 disorder   . Depression   . Liver disease   . Diabetes mellitus   . High cholesterol    History reviewed. No pertinent past surgical history. No family history on file. History  Substance Use Topics  . Smoking status: Current Every Day Smoker -- 1.00 packs/day for 15 years    Types: Cigarettes  . Smokeless tobacco: Not on file  . Alcohol Use: 3.6 oz/week    6 Cans of beer per week     Comment: occasionally   OB History   Grav Para Term Preterm Abortions TAB SAB Ect Mult Living                 Review of Systems  Constitutional: Negative for fever and appetite change.  HENT: Positive for dental problem. Negative for congestion, sore throat, facial swelling, mouth sores, trouble swallowing, neck pain and neck stiffness.   Eyes: Negative for pain and visual disturbance.  Respiratory: Negative for cough, chest tightness and shortness of breath.   Cardiovascular:       Right rib pain  Gastrointestinal: Negative for nausea, vomiting, abdominal pain  and abdominal distention.  Genitourinary: Negative for dysuria, hematuria and flank pain.  Skin: Negative for rash.  Neurological: Negative for dizziness, facial asymmetry, weakness, numbness and headaches.  Hematological: Negative for adenopathy.  All other systems reviewed and are negative.    Allergies  Codeine  Home Medications   Current Outpatient Rx  Name  Route  Sig  Dispense  Refill  . albuterol (PROVENTIL HFA;VENTOLIN HFA) 108 (90 BASE) MCG/ACT inhaler   Inhalation   Inhale 2 puffs into the lungs every 6 (six) hours as needed. Shortness of Breath/COPD         . cyclobenzaprine (FLEXERIL) 10 MG tablet   Oral   Take 1 tablet (10 mg total) by mouth 2 (two) times daily as needed for muscle spasms.   20 tablet   0   . FLUoxetine (PROZAC) 10 MG capsule   Oral   Take 10 mg by mouth daily.         Marland Kitchen gabapentin (NEURONTIN) 300 MG capsule   Oral   Take 300 mg by mouth 2 (two) times daily.          . meclizine (ANTIVERT) 25 MG tablet   Oral   Take 25 mg by mouth 3 (three) times daily as needed for dizziness.         . metFORMIN (GLUCOPHAGE) 500 MG tablet  Oral   Take 500 mg by mouth 2 (two) times daily.          . naproxen (NAPROSYN) 500 MG tablet   Oral   Take 1 tablet (500 mg total) by mouth 2 (two) times daily.   20 tablet   0   . oxyCODONE-acetaminophen (PERCOCET) 5-325 MG per tablet   Oral   Take 2 tablets by mouth every 4 (four) hours as needed for pain.   20 tablet   0   . traZODone (DESYREL) 150 MG tablet   Oral   Take 150 mg by mouth at bedtime.          BP 118/83  Pulse 97  Temp(Src) 97.6 F (36.4 C) (Oral)  Resp 20  Ht 5\' 4"  (1.626 m)  Wt 190 lb (86.183 kg)  BMI 32.6 kg/m2  SpO2 95%  LMP 06/23/2012 Physical Exam  Nursing note and vitals reviewed. Constitutional: She is oriented to person, place, and time. She appears well-developed and well-nourished. No distress.  HENT:  Head: Normocephalic and atraumatic. No trismus in  the jaw.  Right Ear: Tympanic membrane and ear canal normal.  Left Ear: Tympanic membrane and ear canal normal.  Mouth/Throat: Uvula is midline, oropharynx is clear and moist and mucous membranes are normal. Dental caries present. No dental abscesses or edematous.    Multiple dental caries and widespread dental dz.  No obvious dental abscess, facial edema, trismus, or sublingual abnml  Neck: Normal range of motion. Neck supple.  Cardiovascular: Normal rate, regular rhythm, normal heart sounds and intact distal pulses.   No murmur heard. Pulmonary/Chest: Effort normal and breath sounds normal. No respiratory distress. She exhibits tenderness.  Mild ttp of the right lateral ribs.  Old appearing bruise to the right lateral chest.  No edema,crepitus or guarding.  Abdominal: Soft. She exhibits no distension. There is no tenderness. There is no rebound and no guarding.  Musculoskeletal: Normal range of motion. She exhibits no edema.  Lymphadenopathy:    She has no cervical adenopathy.  Neurological: She is alert and oriented to person, place, and time. She exhibits normal muscle tone. Coordination normal.  Skin: Skin is warm and dry.    ED Course  Procedures (including critical care time) Labs Reviewed - No data to display   MDM     Previous ED chart reviewed by me.  Patient here today for same complaints as previous visits.  Missed her dental appt.  Requesting additional pain medication.  I have advised pt that ER is not appropriate place for chronic pain management and further narcotics are not indicated.Marland Kitchen  Resource list provided.    VSS.  abd is NT.  Has old appearing bruise to the right lateral ribs.  No guarding or crepitus.  Appears stable for discharge  Hani Campusano L. Trisha Mangle, PA-C 07/11/12 [phone]

## 2012-07-11 NOTE — ED Notes (Signed)
Fall x 2 wks ago, seen for same then.  C/o right rib pain since.  Also c/o right sided dental pain.

## 2012-07-11 NOTE — ED Provider Notes (Signed)
Medical screening examination/treatment/procedure(s) were performed by non-physician practitioner and as supervising physician I was immediately available for consultation/collaboration. Devoria Albe, MD, FACEP   Ward Givens, MD 07/11/12 [phone]

## 2012-07-23 ENCOUNTER — Emergency Department (HOSPITAL_COMMUNITY)
Admission: EM | Admit: 2012-07-23 | Discharge: 2012-07-24 | Disposition: A | Discharge status: Other Acute Inpatient Hospital | Payer: Self-pay | Attending: Emergency Medicine | Admitting: Emergency Medicine

## 2012-07-23 ENCOUNTER — Encounter (HOSPITAL_COMMUNITY): Payer: Self-pay | Admitting: *Deleted

## 2012-07-23 DIAGNOSIS — Z8719 Personal history of other diseases of the digestive system: Secondary | ICD-10-CM | POA: Insufficient documentation

## 2012-07-23 DIAGNOSIS — R45851 Suicidal ideations: Secondary | ICD-10-CM | POA: Insufficient documentation

## 2012-07-23 DIAGNOSIS — M549 Dorsalgia, unspecified: Secondary | ICD-10-CM | POA: Insufficient documentation

## 2012-07-23 DIAGNOSIS — F1920 Other psychoactive substance dependence, uncomplicated: Secondary | ICD-10-CM

## 2012-07-23 DIAGNOSIS — F319 Bipolar disorder, unspecified: Secondary | ICD-10-CM | POA: Insufficient documentation

## 2012-07-23 DIAGNOSIS — Z862 Personal history of diseases of the blood and blood-forming organs and certain disorders involving the immune mechanism: Secondary | ICD-10-CM | POA: Insufficient documentation

## 2012-07-23 DIAGNOSIS — Z8639 Personal history of other endocrine, nutritional and metabolic disease: Secondary | ICD-10-CM | POA: Insufficient documentation

## 2012-07-23 DIAGNOSIS — Z79899 Other long term (current) drug therapy: Secondary | ICD-10-CM | POA: Insufficient documentation

## 2012-07-23 DIAGNOSIS — F1420 Cocaine dependence, uncomplicated: Secondary | ICD-10-CM | POA: Insufficient documentation

## 2012-07-23 DIAGNOSIS — F411 Generalized anxiety disorder: Secondary | ICD-10-CM | POA: Insufficient documentation

## 2012-07-23 DIAGNOSIS — E119 Type 2 diabetes mellitus without complications: Secondary | ICD-10-CM | POA: Insufficient documentation

## 2012-07-23 DIAGNOSIS — F329 Major depressive disorder, single episode, unspecified: Secondary | ICD-10-CM

## 2012-07-23 DIAGNOSIS — F17200 Nicotine dependence, unspecified, uncomplicated: Secondary | ICD-10-CM | POA: Insufficient documentation

## 2012-07-23 DIAGNOSIS — F1010 Alcohol abuse, uncomplicated: Secondary | ICD-10-CM | POA: Insufficient documentation

## 2012-07-23 DIAGNOSIS — F1220 Cannabis dependence, uncomplicated: Secondary | ICD-10-CM | POA: Insufficient documentation

## 2012-07-23 DIAGNOSIS — Z3202 Encounter for pregnancy test, result negative: Secondary | ICD-10-CM | POA: Insufficient documentation

## 2012-07-23 DIAGNOSIS — F32A Depression, unspecified: Secondary | ICD-10-CM

## 2012-07-23 LAB — RAPID URINE DRUG SCREEN, HOSP PERFORMED
Barbiturates: NOT DETECTED
Benzodiazepines: NOT DETECTED
Cocaine: NOT DETECTED
Tetrahydrocannabinol: POSITIVE — AB

## 2012-07-23 LAB — COMPREHENSIVE METABOLIC PANEL
ALT: 23 U/L (ref 0–35)
AST: 21 U/L (ref 0–37)
Albumin: 3.5 g/dL (ref 3.5–5.2)
Calcium: 9.9 mg/dL (ref 8.4–10.5)
Sodium: 136 mEq/L (ref 135–145)
Total Protein: 7.6 g/dL (ref 6.0–8.3)

## 2012-07-23 LAB — CBC WITH DIFFERENTIAL/PLATELET
Basophils Absolute: 0 10*3/uL (ref 0.0–0.1)
Eosinophils Absolute: 0.1 10*3/uL (ref 0.0–0.7)
Eosinophils Relative: 1 % (ref 0–5)
Lymphocytes Relative: 42 % (ref 12–46)
MCH: 29.2 pg (ref 26.0–34.0)
MCV: 88.6 fL (ref 78.0–100.0)
Platelets: 276 10*3/uL (ref 150–400)
RDW: 16.7 % — ABNORMAL HIGH (ref 11.5–15.5)
WBC: 6.6 10*3/uL (ref 4.0–10.5)

## 2012-07-23 MED ORDER — ALBUTEROL SULFATE HFA 108 (90 BASE) MCG/ACT IN AERS: 2.0000 | INHALATION_SPRAY | Freq: Four times a day (QID) | RESPIRATORY_TRACT | Status: DC | PRN

## 2012-07-23 MED ORDER — FLUOXETINE HCL 10 MG PO CAPS
ORAL_CAPSULE | ORAL | Status: AC
  Filled 2012-07-23: qty 1

## 2012-07-23 MED ORDER — GABAPENTIN 300 MG PO CAPS
300.0000 mg | ORAL_CAPSULE | Freq: Two times a day (BID) | ORAL | Status: DC
  Administered 2012-07-23 – 2012-07-24 (×2): 300 mg via ORAL
  Filled 2012-07-23 (×6): qty 1

## 2012-07-23 MED ORDER — FLUOXETINE HCL 10 MG PO CAPS
10.0000 mg | ORAL_CAPSULE | Freq: Every day | ORAL | Status: DC
  Administered 2012-07-23 – 2012-07-24 (×2): 10 mg via ORAL
  Filled 2012-07-23 (×4): qty 1

## 2012-07-23 MED ORDER — LORAZEPAM 1 MG PO TABS
0.0000 mg | ORAL_TABLET | Freq: Four times a day (QID) | ORAL | Status: DC
  Administered 2012-07-24: 1 mg via ORAL
  Filled 2012-07-23 (×2): qty 1

## 2012-07-23 MED ORDER — ADULT MULTIVITAMIN W/MINERALS CH
1.0000 | ORAL_TABLET | Freq: Every day | ORAL | Status: DC
  Administered 2012-07-23 – 2012-07-24 (×2): 1 via ORAL
  Filled 2012-07-23 (×2): qty 1

## 2012-07-23 MED ORDER — TRAZODONE HCL 50 MG PO TABS
ORAL_TABLET | ORAL | Status: AC
  Filled 2012-07-23: qty 3

## 2012-07-23 MED ORDER — METFORMIN HCL 500 MG PO TABS
500.0000 mg | ORAL_TABLET | Freq: Two times a day (BID) | ORAL | Status: DC
  Administered 2012-07-23 – 2012-07-24 (×2): 500 mg via ORAL
  Filled 2012-07-23 (×2): qty 1

## 2012-07-23 MED ORDER — GABAPENTIN 300 MG PO CAPS
ORAL_CAPSULE | ORAL | Status: AC
  Filled 2012-07-23: qty 1

## 2012-07-23 MED ORDER — FOLIC ACID 1 MG PO TABS
1.0000 mg | ORAL_TABLET | Freq: Every day | ORAL | Status: DC
  Administered 2012-07-23 – 2012-07-24 (×2): 1 mg via ORAL
  Filled 2012-07-23 (×2): qty 1

## 2012-07-23 MED ORDER — VITAMIN B-1 100 MG PO TABS
100.0000 mg | ORAL_TABLET | Freq: Every day | ORAL | Status: DC
  Administered 2012-07-23 – 2012-07-24 (×2): 100 mg via ORAL
  Filled 2012-07-23 (×2): qty 1

## 2012-07-23 MED ORDER — NICOTINE 21 MG/24HR TD PT24
21.0000 mg | MEDICATED_PATCH | Freq: Every day | TRANSDERMAL | Status: DC
  Administered 2012-07-23 – 2012-07-24 (×2): 21 mg via TRANSDERMAL
  Filled 2012-07-23 (×2): qty 1

## 2012-07-23 MED ORDER — TRAZODONE HCL 50 MG PO TABS
150.0000 mg | ORAL_TABLET | Freq: Every day | ORAL | Status: DC
  Administered 2012-07-23: 150 mg via ORAL
  Filled 2012-07-23 (×3): qty 3

## 2012-07-23 MED ORDER — THIAMINE HCL 100 MG/ML IJ SOLN
100.0000 mg | Freq: Every day | INTRAMUSCULAR | Status: DC
  Filled 2012-07-23 (×2): qty 2

## 2012-07-23 MED ORDER — LORAZEPAM 1 MG PO TABS: 0.0000 mg | ORAL_TABLET | Freq: Two times a day (BID) | ORAL | Status: DC

## 2012-07-23 NOTE — Progress Notes (Signed)
Patient has been accepted to [facility] by Fransisca Kaufmann NP to the services of dr. Dub Mikes, bed 503.1.

## 2012-07-23 NOTE — ED Notes (Signed)
Sitter heated pt and food tray.

## 2012-07-23 NOTE — BH Assessment (Signed)
Assessment Note   Julie Burnett is an 42 y.o. female. She presents voluntarily to the Emergency Department stating that she is going to harm herself by taking an overdose of her medications. She reports that she stopped taking her medications about one month ago. She states that "I felt better so I stopped taking them." She reports that [facility] took her off the Zoloft due to some side effects that she was experiencing and switched her to Prozac, which she stated worked for her. But then, she decided to stop talking her medications. She reports that she feels down, sad and doesn't want to "do anything." She reports that she has been having thoughts of harming her self for "awhile."  She will not contract for safety. She reports that she is also taking opiates every day; however her drug screen is negative. When she was told that her drug screen is negative, she stated that it couldn't be right, because she reports that she is buying oxycodone's "off the street" daily- 2-3 pills per day. She also reports that she drinks 2-3 40 ounce beers per day but today she only drank a 12 ounce beer. She does have a court date, which appears to be related to her substance abuse issue. She states she has a court date for August 20 for misdemeanor larceny; which she explains that she stole beer from [place]. She doesn't appear to be in any distress and no signs of withdrawal are noted at this time. She is not experiencing any delusions or hallucinations at this time. She denies any homicidal ideation. She is cooperative and appropriate. She has been told that if she is accepted for inpatient services, she will not receive any opiate based pain medications. She agreed.   Axis I: Bipolar, Depressed; History of Opiate and Alcohol Abuse Axis II: Deferred Axis III: Diabetes, Liver Disease, High Cholesterol Axis IV: Stressors include a court hearing in August, related to substance abuse issues; financial and relationship issues.   Axis V: GAF 25  LOCUS 30  Past Medical History:  Past Medical History  Diagnosis Date  . Anxiety   . Bipolar 1 disorder   . Depression   . Liver disease   . Diabetes mellitus   . High cholesterol     History reviewed. No pertinent past surgical history.  Family History: History reviewed. No pertinent family history.  Social History:  reports that she has been smoking Cigarettes.  She has a 15 pack-year smoking history. She does not have any smokeless tobacco history on file. She reports that she drinks about 3.6 ounces of alcohol per week. She reports that she uses illicit drugs.  Additional Social History:     CIWA: CIWA-Ar BP: 113/59 mmHg Pulse Rate: 88 Nausea and Vomiting: no nausea and no vomiting Tactile Disturbances: none Tremor: no tremor Auditory Disturbances: not present Paroxysmal Sweats: no sweat visible Visual Disturbances: not present Anxiety: no anxiety, at ease Headache, Fullness in Head: none present Agitation: normal activity Orientation and Clouding of Sensorium: oriented and can do serial additions CIWA-Ar Total: 0 COWS:    Allergies:  Allergies  Allergen Reactions  . Codeine Itching and Rash    Home Medications:  (Not in a hospital admission)  OB/GYN Status:  Patient's last menstrual period was 06/23/2012.  General Assessment Data Location of Assessment: [facility] ACT Assessment: Yes Living Arrangements: Children Can pt return to current living arrangement?: Yes Admission Status: Voluntary Is patient capable of signing voluntary admission?: Yes Transfer from: Acute  Hospital Referral Source: MD     Risk to self Suicidal Ideation: Yes-Currently Present Suicidal Intent: Yes-Currently Present Is patient at risk for suicide?: Yes Suicidal Plan?: Yes-Currently Present Specify Current Suicidal Plan:  (states she will take pills and overdose) Access to Means: Yes Specify Access to Suicidal Means: medications she has What has been your use  of drugs/alcohol within the last 12 months?:  (daily-opiates-off the street per patient) Previous Attempts/Gestures: Yes How many times?:  (1 time reported) Triggers for Past Attempts:  (stops taking her medications) Intentional Self Injurious Behavior: None Family Suicide History: No Recent stressful life event(s): Other (Comment) (court date pending) Persecutory voices/beliefs?: No Depression: Yes Depression Symptoms: Loss of interest in usual pleasures Substance abuse history and/or treatment for substance abuse?: Yes Suicide prevention information given to non-admitted patients: Not applicable  Risk to Others Homicidal Ideation: No Thoughts of Harm to Others: No Current Homicidal Intent: No Current Homicidal Plan: No Access to Homicidal Means: No History of harm to others?: No Assessment of Violence: None Noted Does patient have access to weapons?: No Criminal Charges Pending?: Yes Describe Pending Criminal Charges:  (misd. larceny-stole beer from [employer]) Does patient have a court date: Yes Court Date: 08/31/12  Psychosis Hallucinations: None noted Delusions: None noted  Mental Status Report Appear/Hygiene: Disheveled Eye Contact: Fair Motor Activity: Unremarkable Speech: Logical/coherent Level of Consciousness: Alert Mood: Depressed;Ambivalent Affect: Appropriate to circumstance;Blunted;Depressed Anxiety Level: Minimal Thought Processes: Coherent Judgement: Unimpaired Orientation: Person;Place;Time;Situation;Appropriate for developmental age Obsessive Compulsive Thoughts/Behaviors: None  Cognitive Functioning Concentration: Decreased Memory: Recent Intact;Remote Intact IQ: Average Insight: Fair Impulse Control: Poor Appetite: Good Sleep: Decreased Total Hours of Sleep: 6 Vegetative Symptoms: None  ADLScreening [facility] Assessment Services) Patient's cognitive ability adequate to safely complete daily activities?: Yes Patient able to express need for  assistance with ADLs?: Yes Independently performs ADLs?: Yes (appropriate for developmental age)  Abuse/Neglect [facility]) Physical Abuse: Denies Verbal Abuse: Denies Sexual Abuse: Denies  Prior Inpatient Therapy Prior Inpatient Therapy: Yes Prior Therapy Dates:  (May 2013) Prior Therapy Facilty/Provider(s):  ([facility]) Reason for Treatment:  (depression, detox)  Prior Outpatient Therapy Prior Outpatient Therapy: Yes Prior Therapy Dates: current Prior Therapy Facilty/Provider(s): [facility] Reason for Treatment: medicaton management  ADL Screening (condition at time of admission) Patient's cognitive ability adequate to safely complete daily activities?: Yes Patient able to express need for assistance with ADLs?: Yes Independently performs ADLs?: Yes (appropriate for developmental age)  Home Assistive Devices/Equipment Home Assistive Devices/Equipment: None    Abuse/Neglect Assessment (Assessment to be complete while patient is alone) Physical Abuse: Denies Verbal Abuse: Denies Sexual Abuse: Denies Values / Beliefs Cultural Requests During Hospitalization: None Spiritual Requests During Hospitalization: None        Additional Information 1:1 In Past 12 Months?: No CIRT Risk: No Elopement Risk: No Does patient have medical clearance?: Yes     Disposition:  Disposition Initial Assessment Completed for this Encounter: Yes Disposition of Patient: Inpatient treatment program Type of inpatient treatment program: Adult  On Site Evaluation by:  Dr. Rubin Payor Reviewed with Physician:  [facility] not have any beds currently. Will make referral to [facility]   Shon Baton H 07/23/2012 10:56 PM

## 2012-07-23 NOTE — ED Provider Notes (Signed)
History    CSN: [id number] Arrival date & time 07/23/12  4098  First MD Initiated Contact with Patient 07/23/12 1958     Chief Complaint  Patient presents with  . V70.1   (Consider location/radiation/quality/duration/timing/severity/associated sxs/prior Treatment) The history is provided by the patient.  patient presents with depression and suicidal thoughts. She states that she's been getting worse for a while. She states she would overdose on her pills. Previous history of depression and previous overdose attempts. No chest pain or trouble breathing. She states she does take her to says that she gets on the street. She takes a couple of days. She states is not her prescription. She also smokes everyday. She is worried about alcohol withdrawal also. She states she drinks at least a sixpack every day. She states she's been off her medications for about a month. She states she just stopped taking them. Past Medical History  Diagnosis Date  . Anxiety   . Bipolar 1 disorder   . Depression   . Liver disease   . Diabetes mellitus   . High cholesterol    History reviewed. No pertinent past surgical history. History reviewed. No pertinent family history. History  Substance Use Topics  . Smoking status: Current Every Day Smoker -- 1.00 packs/day for 15 years    Types: Cigarettes  . Smokeless tobacco: Not on file  . Alcohol Use: 3.6 oz/week    6 Cans of beer per week     Comment: occasionally   OB History   Grav Para Term Preterm Abortions TAB SAB Ect Mult Living                 Review of Systems  Constitutional: Negative for activity change and appetite change.  HENT: Negative for neck stiffness.   Eyes: Negative for pain.  Respiratory: Negative for chest tightness and shortness of breath.   Cardiovascular: Negative for chest pain and leg swelling.  Gastrointestinal: Negative for nausea, vomiting, abdominal pain and diarrhea.  Genitourinary: Negative for flank pain.   Musculoskeletal: Positive for back pain.  Skin: Negative for rash.  Neurological: Negative for weakness, numbness and headaches.  Psychiatric/Behavioral: Positive for suicidal ideas. Negative for behavioral problems.    Allergies  Codeine  Home Medications   Current Outpatient Rx  Name  Route  Sig  Dispense  Refill  . FLUoxetine (PROZAC) 10 MG capsule   Oral   Take 10 mg by mouth daily.         Marland Kitchen gabapentin (NEURONTIN) 300 MG capsule   Oral   Take 300 mg by mouth 2 (two) times daily.          . metFORMIN (GLUCOPHAGE) 500 MG tablet   Oral   Take 500 mg by mouth 2 (two) times daily.          Marland Kitchen oxyCODONE-acetaminophen (PERCOCET) 5-325 MG per tablet   Oral   Take 2 tablets by mouth every 4 (four) hours as needed for pain.   20 tablet   0   . traZODone (DESYREL) 150 MG tablet   Oral   Take 150 mg by mouth at bedtime.         Marland Kitchen albuterol (PROVENTIL HFA;VENTOLIN HFA) 108 (90 BASE) MCG/ACT inhaler   Inhalation   Inhale 2 puffs into the lungs every 6 (six) hours as needed. Shortness of Breath/COPD          BP 119/66  Pulse 92  Temp(Src) 98.6 F (37 C) (Oral)  Resp 18  Ht 5\' 5"  (1.651 m)  Wt 190 lb (86.183 kg)  BMI 31.62 kg/m2  SpO2 97%  LMP 06/23/2012 Physical Exam  Nursing note and vitals reviewed. Constitutional: She is oriented to person, place, and time. She appears well-developed and well-nourished.  HENT:  Head: Normocephalic and atraumatic.  Eyes: EOM are normal. Pupils are equal, round, and reactive to light.  Neck: Normal range of motion. Neck supple.  Cardiovascular: Normal rate, regular rhythm and normal heart sounds.   No murmur heard. Pulmonary/Chest: Effort normal and breath sounds normal. No respiratory distress. She has no wheezes. She has no rales.  Abdominal: Soft. Bowel sounds are normal. She exhibits no distension. There is no tenderness. There is no rebound and no guarding.  Musculoskeletal: Normal range of motion.  Neurological:  She is alert and oriented to person, place, and time. No cranial nerve deficit.  Skin: Skin is warm and dry.  Psychiatric: She has a normal mood and affect. Her speech is normal.    ED Course  Procedures (including critical care time) Labs Reviewed  CBC WITH DIFFERENTIAL - Abnormal; Notable for the following:    RDW 16.7 (*)    All other components within normal limits  COMPREHENSIVE METABOLIC PANEL - Abnormal; Notable for the following:    Glucose, Bld 174 (*)    Total Bilirubin 0.2 (*)    All other components within normal limits  ETHANOL - Abnormal; Notable for the following:    Alcohol, Ethyl (B) 59 (*)    All other components within normal limits  URINE RAPID DRUG SCREEN (HOSP PERFORMED) - Abnormal; Notable for the following:    Tetrahydrocannabinol POSITIVE (*)    All other components within normal limits  ACETAMINOPHEN LEVEL  PREGNANCY, URINE   No results found. 1. Depression   2. Polysubstance dependence     MDM  Patient is suicidal. She has a plan. She's also abusing narcotics alcohol. She appears to medically cleared at this time. Will be seen by ACT team.  Juliet Rude. Rubin Payor, MD 07/23/12 2130

## 2012-07-23 NOTE — ED Notes (Signed)
Pt in paper scrubs, security checked belonging. Pt states has been depressed for 1 month. States she is having  Thoughts of hurting herself, no others. MD at the bedside.

## 2012-07-23 NOTE — ED Notes (Addendum)
Pt states she is depressed and wants to kill herself by over dosing on pills or walking out in front of a car. Pt states she has been having these thoughts off and on for about a month. Pt states she needs help getting back on her meds.

## 2012-07-24 ENCOUNTER — Encounter (HOSPITAL_COMMUNITY): Payer: Self-pay | Admitting: *Deleted

## 2012-07-24 ENCOUNTER — Inpatient Hospital Stay (HOSPITAL_COMMUNITY)
Admission: AD | Admit: 2012-07-24 | Discharge: 2012-07-28 | DRG: 885 | Disposition: A | Discharge status: Home / Self Care | Payer: 59 | Source: Intra-hospital | Attending: Psychiatry | Admitting: Psychiatry

## 2012-07-24 DIAGNOSIS — Z5900 Homelessness unspecified: Secondary | ICD-10-CM

## 2012-07-24 DIAGNOSIS — E119 Type 2 diabetes mellitus without complications: Secondary | ICD-10-CM | POA: Diagnosis present

## 2012-07-24 DIAGNOSIS — F10230 Alcohol dependence with withdrawal, uncomplicated: Secondary | ICD-10-CM

## 2012-07-24 DIAGNOSIS — F1210 Cannabis abuse, uncomplicated: Secondary | ICD-10-CM | POA: Diagnosis present

## 2012-07-24 DIAGNOSIS — F1120 Opioid dependence, uncomplicated: Secondary | ICD-10-CM | POA: Diagnosis present

## 2012-07-24 DIAGNOSIS — F1920 Other psychoactive substance dependence, uncomplicated: Secondary | ICD-10-CM

## 2012-07-24 DIAGNOSIS — Z9119 Patient's noncompliance with other medical treatment and regimen: Secondary | ICD-10-CM

## 2012-07-24 DIAGNOSIS — Z91199 Patient's noncompliance with other medical treatment and regimen due to unspecified reason: Secondary | ICD-10-CM

## 2012-07-24 DIAGNOSIS — F17200 Nicotine dependence, unspecified, uncomplicated: Secondary | ICD-10-CM | POA: Diagnosis present

## 2012-07-24 DIAGNOSIS — Z5987 Material hardship due to limited financial resources, not elsewhere classified: Secondary | ICD-10-CM

## 2012-07-24 DIAGNOSIS — J4489 Other specified chronic obstructive pulmonary disease: Secondary | ICD-10-CM | POA: Diagnosis present

## 2012-07-24 DIAGNOSIS — R45851 Suicidal ideations: Secondary | ICD-10-CM

## 2012-07-24 DIAGNOSIS — F332 Major depressive disorder, recurrent severe without psychotic features: Principal | ICD-10-CM | POA: Diagnosis present

## 2012-07-24 DIAGNOSIS — F1020 Alcohol dependence, uncomplicated: Secondary | ICD-10-CM | POA: Diagnosis present

## 2012-07-24 DIAGNOSIS — J449 Chronic obstructive pulmonary disease, unspecified: Secondary | ICD-10-CM | POA: Diagnosis present

## 2012-07-24 DIAGNOSIS — Z598 Other problems related to housing and economic circumstances: Secondary | ICD-10-CM

## 2012-07-24 DIAGNOSIS — F411 Generalized anxiety disorder: Secondary | ICD-10-CM

## 2012-07-24 DIAGNOSIS — F1410 Cocaine abuse, uncomplicated: Secondary | ICD-10-CM | POA: Diagnosis present

## 2012-07-24 DIAGNOSIS — Z79899 Other long term (current) drug therapy: Secondary | ICD-10-CM

## 2012-07-24 DIAGNOSIS — F10939 Alcohol use, unspecified with withdrawal, unspecified: Secondary | ICD-10-CM | POA: Diagnosis not present

## 2012-07-24 DIAGNOSIS — E7800 Pure hypercholesterolemia, unspecified: Secondary | ICD-10-CM | POA: Diagnosis present

## 2012-07-24 DIAGNOSIS — F10239 Alcohol dependence with withdrawal, unspecified: Secondary | ICD-10-CM | POA: Diagnosis not present

## 2012-07-24 DIAGNOSIS — K769 Liver disease, unspecified: Secondary | ICD-10-CM | POA: Diagnosis present

## 2012-07-24 LAB — GLUCOSE, CAPILLARY: Glucose-Capillary: 145 mg/dL — ABNORMAL HIGH (ref 70–99)

## 2012-07-24 MED ORDER — ACETAMINOPHEN 325 MG PO TABS: 650.0000 mg | ORAL_TABLET | Freq: Four times a day (QID) | ORAL | Status: DC | PRN

## 2012-07-24 MED ORDER — ALUM & MAG HYDROXIDE-SIMETH 200-200-20 MG/5ML PO SUSP: 30.0000 mL | ORAL | Status: DC | PRN

## 2012-07-24 MED ORDER — MAGNESIUM HYDROXIDE 400 MG/5ML PO SUSP: 30.0000 mL | Freq: Every day | ORAL | Status: DC | PRN

## 2012-07-24 MED ORDER — TRAZODONE HCL 50 MG PO TABS
50.0000 mg | ORAL_TABLET | Freq: Every evening | ORAL | Status: DC | PRN
  Administered 2012-07-24 – 2012-07-26 (×2): 50 mg via ORAL
  Filled 2012-07-24: qty 1
  Filled 2012-07-24: qty 28
  Filled 2012-07-24: qty 1
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
## 2012-07-24 NOTE — BH Assessment (Signed)
[facility] Assessment Progress Note   Patient has been accepted by Fransisca Kaufmann, NP to Dr. Carmelina Dane; she is going to Room 503-Bed 1. Contacted [facility]; received authorization from [place], for 3 days (7/13-7/15), auth # [id number].  Patient signed forms; patient is awaiting transport by [facility].   Shon Baton, MSW, LCSW, LCASA, CSW-G

## 2012-07-24 NOTE — [facility] Group Notes (Signed)
[facility] Group Notes: (Clinical Social Work)   07/24/2012      Type of Therapy:  Group Therapy   Participation Level:  Did Not Attend    Ambrose Mantle, LCSW 07/24/2012, 4:36 PM
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
## 2012-07-24 NOTE — Tx Team (Signed)
Initial Interdisciplinary Treatment Plan  PATIENT STRENGTHS: (choose at least two) Ability for insight Average or above average intelligence Communication skills Motivation for treatment/growth Supportive family/friends  PATIENT STRESSORS: Medication change or noncompliance Substance abuse   PROBLEM LIST: Problem List/Patient Goals Date to be addressed Date deferred Reason deferred Estimated date of resolution  Suicidal ideation 07/24/2012   D/c        Substance abuse 07/24/2012   D/c        depression 07/24/2012   D/c                           DISCHARGE CRITERIA:  Ability to meet basic life and health needs Improved stabilization in mood, thinking, and/or behavior Medical problems require only outpatient monitoring Motivation to continue treatment in a less acute level of care Need for constant or close observation no longer present Reduction of life-threatening or endangering symptoms to within safe limits Verbal commitment to aftercare and medication compliance Withdrawal symptoms are absent or subacute and managed without 24-hour nursing intervention  PRELIMINARY DISCHARGE PLAN: Attend aftercare/continuing care group Attend PHP/IOP Attend 12-step recovery group Outpatient therapy Return to previous living arrangement  PATIENT/FAMIILY INVOLVEMENT: This treatment plan has been presented to and reviewed with the patient, Julie Burnett.  The patient and family have been given the opportunity to ask questions and make suggestions.  Julie Burnett 07/24/2012, 2:17 PM

## 2012-07-24 NOTE — ED Notes (Signed)
Mother of pt called Particia Strahm,  [phone]. Advised pt was sleeping. She wants pt or someone to call when pt is leaving.

## 2012-07-25 ENCOUNTER — Encounter (HOSPITAL_COMMUNITY): Payer: Self-pay | Admitting: Psychiatry

## 2012-07-25 DIAGNOSIS — F332 Major depressive disorder, recurrent severe without psychotic features: Principal | ICD-10-CM | POA: Diagnosis present

## 2012-07-25 DIAGNOSIS — F1020 Alcohol dependence, uncomplicated: Secondary | ICD-10-CM | POA: Diagnosis present

## 2012-07-25 DIAGNOSIS — F10239 Alcohol dependence with withdrawal, unspecified: Secondary | ICD-10-CM | POA: Diagnosis present

## 2012-07-25 LAB — GLUCOSE, CAPILLARY
Glucose-Capillary: 155 mg/dL — ABNORMAL HIGH (ref 70–99)
Glucose-Capillary: 143 mg/dL — ABNORMAL HIGH (ref 70–99)

## 2012-07-25 MED ORDER — CHLORDIAZEPOXIDE HCL 25 MG PO CAPS
25.0000 mg | ORAL_CAPSULE | ORAL | Status: AC
  Administered 2012-07-27 – 2012-07-28 (×2): 25 mg via ORAL
  Filled 2012-07-25: qty 1

## 2012-07-25 MED ORDER — LOPERAMIDE HCL 2 MG PO CAPS
2.0000 mg | ORAL_CAPSULE | ORAL | Status: AC | PRN
  Administered 2012-07-27: 4 mg via ORAL

## 2012-07-25 MED ORDER — CHLORDIAZEPOXIDE HCL 25 MG PO CAPS
25.0000 mg | ORAL_CAPSULE | Freq: Four times a day (QID) | ORAL | Status: AC
  Administered 2012-07-25 – 2012-07-26 (×5): 25 mg via ORAL
  Filled 2012-07-25 (×6): qty 1

## 2012-07-25 MED ORDER — ADULT MULTIVITAMIN W/MINERALS CH
1.0000 | ORAL_TABLET | Freq: Every day | ORAL | Status: DC
  Administered 2012-07-25 – 2012-07-28 (×4): 1 via ORAL
  Filled 2012-07-25 (×6): qty 1

## 2012-07-25 MED ORDER — METFORMIN HCL 500 MG PO TABS
500.0000 mg | ORAL_TABLET | Freq: Two times a day (BID) | ORAL | Status: DC
  Administered 2012-07-25 – 2012-07-28 (×6): 500 mg via ORAL
  Filled 2012-07-25 (×8): qty 1
  Filled 2012-07-25 (×2): qty 28

## 2012-07-25 MED ORDER — CLONIDINE HCL 0.1 MG PO TABS
0.1000 mg | ORAL_TABLET | ORAL | Status: DC
  Administered 2012-07-27 – 2012-07-28 (×2): 0.1 mg via ORAL
  Filled 2012-07-25 (×4): qty 1

## 2012-07-25 MED ORDER — NICOTINE 21 MG/24HR TD PT24
21.0000 mg | MEDICATED_PATCH | Freq: Every day | TRANSDERMAL | Status: DC
  Administered 2012-07-25 – 2012-07-28 (×4): 21 mg via TRANSDERMAL
  Filled 2012-07-25 (×8): qty 1

## 2012-07-25 MED ORDER — CHLORDIAZEPOXIDE HCL 25 MG PO CAPS
25.0000 mg | ORAL_CAPSULE | Freq: Four times a day (QID) | ORAL | Status: AC | PRN
  Administered 2012-07-25: 25 mg via ORAL
  Filled 2012-07-25 (×2): qty 1

## 2012-07-25 MED ORDER — GABAPENTIN 300 MG PO CAPS
300.0000 mg | ORAL_CAPSULE | Freq: Two times a day (BID) | ORAL | Status: DC
  Administered 2012-07-25 – 2012-07-27 (×4): 300 mg via ORAL
  Filled 2012-07-25 (×8): qty 1

## 2012-07-25 MED ORDER — CLONIDINE HCL 0.1 MG PO TABS
0.1000 mg | ORAL_TABLET | Freq: Four times a day (QID) | ORAL | Status: AC
  Administered 2012-07-25 – 2012-07-26 (×6): 0.1 mg via ORAL
  Filled 2012-07-25 (×11): qty 1

## 2012-07-25 MED ORDER — METHOCARBAMOL 500 MG PO TABS: 500.0000 mg | ORAL_TABLET | Freq: Three times a day (TID) | ORAL | Status: DC | PRN

## 2012-07-25 MED ORDER — FLUOXETINE HCL 10 MG PO CAPS
10.0000 mg | ORAL_CAPSULE | Freq: Every day | ORAL | Status: DC
  Administered 2012-07-25 – 2012-07-26 (×2): 10 mg via ORAL
  Filled 2012-07-25 (×4): qty 1

## 2012-07-25 MED ORDER — THIAMINE HCL 100 MG/ML IJ SOLN: 100.0000 mg | Freq: Once | INTRAMUSCULAR | Status: DC

## 2012-07-25 MED ORDER — CLONIDINE HCL 0.1 MG PO TABS
0.1000 mg | ORAL_TABLET | Freq: Every day | ORAL | Status: DC
  Filled 2012-07-25: qty 1

## 2012-07-25 MED ORDER — ONDANSETRON 4 MG PO TBDP: 4.0000 mg | ORAL_TABLET | Freq: Four times a day (QID) | ORAL | Status: AC | PRN

## 2012-07-25 MED ORDER — LOPERAMIDE HCL 2 MG PO CAPS: 2.0000 mg | ORAL_CAPSULE | ORAL | Status: DC | PRN

## 2012-07-25 MED ORDER — NAPROXEN 500 MG PO TABS: 500.0000 mg | ORAL_TABLET | Freq: Two times a day (BID) | ORAL | Status: DC | PRN

## 2012-07-25 MED ORDER — ALBUTEROL SULFATE HFA 108 (90 BASE) MCG/ACT IN AERS: 2.0000 | INHALATION_SPRAY | Freq: Four times a day (QID) | RESPIRATORY_TRACT | Status: DC | PRN

## 2012-07-25 MED ORDER — HYDROXYZINE HCL 25 MG PO TABS
25.0000 mg | ORAL_TABLET | Freq: Four times a day (QID) | ORAL | Status: AC | PRN
  Administered 2012-07-25 – 2012-07-27 (×4): 25 mg via ORAL
  Filled 2012-07-25: qty 1

## 2012-07-25 MED ORDER — DICYCLOMINE HCL 20 MG PO TABS: 20.0000 mg | ORAL_TABLET | Freq: Four times a day (QID) | ORAL | Status: DC | PRN

## 2012-07-25 MED ORDER — VITAMIN B-1 100 MG PO TABS
100.0000 mg | ORAL_TABLET | Freq: Every day | ORAL | Status: DC
  Administered 2012-07-26 – 2012-07-28 (×3): 100 mg via ORAL
  Filled 2012-07-25 (×5): qty 1

## 2012-07-25 MED ORDER — CHLORDIAZEPOXIDE HCL 25 MG PO CAPS
25.0000 mg | ORAL_CAPSULE | Freq: Every day | ORAL | Status: DC
Start: 2012-07-29 — End: 2012-07-28

## 2012-07-25 MED ORDER — CHLORDIAZEPOXIDE HCL 25 MG PO CAPS
25.0000 mg | ORAL_CAPSULE | Freq: Three times a day (TID) | ORAL | Status: AC
  Administered 2012-07-26 – 2012-07-27 (×3): 25 mg via ORAL
  Filled 2012-07-25 (×2): qty 1

## 2012-07-25 NOTE — [facility] Group Notes (Signed)
.  [facility] LCSW Group Therapy          Overcoming Obstacles       1:15 -2:30        07/25/2012  3:51 PM     Type of Therapy:  Group Therapy  Participation Level:  Did not attend group  Wynn Banker 07/25/2012   3:51 PM

## 2012-07-25 NOTE — Progress Notes (Signed)
Recreation Therapy Notes  Date: 07.14.2014  Time: 3:00pm Location: [address] Dayroom     Group Topic/Focus: Self Expression, Coping Skills  Participation Level: Did not attend.  Marykay Lex Floyd Wade, LRT/CTRS  Jearl Klinefelter 07/25/2012 4:22 PM

## 2012-07-25 NOTE — Progress Notes (Signed)
Pt in bed sleeping, will not get up for meds or group this am.

## 2012-07-25 NOTE — [facility] Group Notes (Signed)
[facility] LCSW Aftercare Discharge Planning Group Note   07/25/2012 11:07 AM  Participation Quality:  Did not attend group.  Jeweliana Dudgeon, Joesph July

## 2012-07-25 NOTE — Progress Notes (Signed)
Psychoeducational Group Note  Date:  07/24/2012 Time:  2000  Group Topic/Focus:  Wrap-Up Group:   The focus of this group is to help patients review their daily goal of treatment and discuss progress on daily workbooks.  Participation Level: Did Not Attend  Participation Quality:  Not Applicable  Affect:  Not Applicable  Cognitive:  Not Applicable  Insight:  Not Applicable  Engagement in Group: Not Applicable  Additional Comments: The patient did not attend group since she was sleeping in her room.   Caison Hearn S 07/25/2012, 12:10 AM

## 2012-07-25 NOTE — [facility] Counselor (Signed)
Adult Psychosocial Assessment Update Interdisciplinary Team  [facility] admissions[facility]discharges:  Admissions Discharges  Date:  11/11/11 Date:   11/16/11  Date: Date:  Date: Date:  Date: Date:  Date: Date:   Changes since the last Psychosocial Assessment (including adherence to outpatient mental health and/or substance abuse treatment, situational issues contributing to decompensation and/or relapse). Patient advised of stopping medications and becoming increasingly depressed and having suicidal ideations.  She also endorses drinking six to 12 beers daily and smoking one to two blunts of marijuana.             Discharge Plan 1. Will you be returning to the same living situation after discharge?   Yes: No:      If no, what is your plan?    Patient advised she will not be returning to the place she was living prior to admission.       2. Would you like a referral for services when you are discharged? Yes:     If yes, for what services?  No:       Patient is asking to be referred to [facility].       Summary and Recommendations (to be completed by the evaluator) Susana Duell is a 42 years old Caucasian female admitted with Bipolar Disorder.  She will benefit from crisis stabilization, evaluation for medication, psycho-education groups for coping skills development, group therapy and case management for discharge planning.                        Signature:  Wynn Banker, 07/25/2012 3:47 PM

## 2012-07-25 NOTE — Progress Notes (Signed)
Pt has been isolating/sleeping in room most of the day today.  Pt refused to fill out pt self inventory.  Emotional support provided, Encouraged pt to continue with treatment plan and attend all group activities, q15 min checks maintained for safety.  CIWA and COW assessments done per protocol.

## 2012-07-25 NOTE — Progress Notes (Signed)
Psychoeducational Group Note  Date:  07/25/2012 Time: 1100  Group Topic/Focus:  Self Care:   The focus of this group is to help patients understand the importance of self-care in order to improve or restore emotional, physical, spiritual, interpersonal, and financial health.  Participation Level: Did Not Attend  Participation Quality:  Not Applicable  Affect:  Not Applicable  Cognitive:  Not Applicable  Insight:  Not Applicable  Engagement in Group: Not Applicable  Additional Comments:  Patient did not attend group, patient remained in bed.  Karleen Hampshire Brittini 07/25/2012, 1:56 PM

## 2012-07-25 NOTE — Progress Notes (Signed)
Writer entered patients room and she was observed lying in bed asleep. Patient called her name and she awakened but did not get up. Writer asked if she felt ok and she reports that she is just very tired and wanted to sleep. Writer asked patient if she was in pain and she reported she had no pain, denies feeling suicidal or homicidal and no auditory or visual hallucinations. Writer allowed patient to continue to rest and asked that she come see writer once she got some more rest to discuss her medications available. Safety maintained on unit with 15 min checks, will continue to monitor.

## 2012-07-25 NOTE — H&P (Signed)
Psychiatric Admission Assessment Adult  Patient Identification:  Julie Burnett Date of Evaluation:  07/25/2012 Chief Complaint:  BIPOLAR D/O,NOS ETOH ABUSE OPIATE ABUSE History of Present Illness:  Julie Burnett was feeling "good" a month ago and stopped taking her Prozac that was working for her and her depression increased to the point of her having suicidal ideations.  She has had an intentional overdose in the past and has been in many rehabs for alcohol dependency.  Her last sobriety was in February and she was sober for one month.  At this time, she was living in a halfway house from November to [place] had been "kicked out twice" for non-compliance.  Julie Burnett then moved in with her 69 year-ol daughter and her boyfriend and started drinking again; her daughter drinks daily and her boyfriend is an "alcoholic".  She was in school to get her GED but not at the present time.  Her mother is a good support system for her but she lives in [place].  Julie Burnett's medications improve her depression and assist with her alcohol abuse, being alone and not taking her medications increase her depression and drinking.  Patient did not feel like leaving her bed due to withdrawal symptoms.  Depressed and tearful, decreased energy, depressed affect, decreased energy.  Associated Signs/Synptoms: Depression Symptoms:  depressed mood, fatigue, anxiety, (Hypo) Manic Symptoms:  None Anxiety Symptoms:  Excessive Worry, Psychotic Symptoms:  None PTSD Symptoms:  None  Psychiatric Specialty Exam: Physical Exam:  Completed in ED, reviewed, stable  Review of Systems  Constitutional: Negative.   HENT: Negative.   Eyes: Negative.   Cardiovascular: Negative.   Gastrointestinal: Negative.   Genitourinary: Negative.   Musculoskeletal: Positive for myalgias.  Skin: Negative.   Neurological: Positive for tremors.  Endo/Heme/Allergies: Negative.   Psychiatric/Behavioral: Positive for depression and substance abuse. The patient  is nervous/anxious.     Blood pressure 103/59, pulse 69, temperature 98 F (36.7 C), temperature source Oral, resp. rate 16, height 5\' 5"  (1.651 m), weight 87.091 kg (192 lb), last menstrual period 06/23/2012, SpO2 98.00%.Body mass index is 31.95 kg/(m^2).  General Appearance: Disheveled  Eye Contact::  Poor  Speech:  Slow  Volume:  Decreased  Mood:  Anxious and Depressed  Affect:  Congruent  Thought Process:  Coherent  Orientation:  Full (Time, Place, and Person)  Thought Content:  Negative  Suicidal Thoughts:  No  Homicidal Thoughts:  No  Memory:  Immediate;   Fair Recent;   Fair Remote;   Fair  Judgement:  Impaired  Insight:  Fair  Psychomotor Activity:  Decreased  Concentration:  Fair  Recall:  Fair  Akathisia:  No  Handed:  Right  AIMS (if indicated):     Assets:  Resilience  Sleep:  Number of Hours: 5.5    Past Psychiatric History: Diagnosis:  Major Depression, Alcohol dependency  Hospitalizations:  [facility] x 1  Outpatient Care:  [facility]  Substance Abuse Care:  [facility], [facility], [facility], [facility]  Self-Mutilation:  None  Suicidal Attempts:  Once, overdose  Violent Behaviors:  None   Past Medical History:   Past Medical History  Diagnosis Date  . Anxiety   . Bipolar 1 disorder   . Depression   . Liver disease   . Diabetes mellitus   . High cholesterol    None. Allergies:   Allergies  Allergen Reactions  . Codeine Itching and Rash   PTA Medications: Prescriptions prior to admission  Medication Sig Dispense Refill  . FLUoxetine (PROZAC) 10 MG  capsule Take 10 mg by mouth daily.      Marland Kitchen gabapentin (NEURONTIN) 300 MG capsule Take 300 mg by mouth 2 (two) times daily.       . metFORMIN (GLUCOPHAGE) 500 MG tablet Take 500 mg by mouth 2 (two) times daily.       Marland Kitchen oxyCODONE-acetaminophen (PERCOCET) 5-325 MG per tablet Take 2 tablets by mouth every 4 (four) hours as needed for pain.  20 tablet  0  . traZODone (DESYREL) 150 MG tablet Take 150 mg by mouth at bedtime.       Marland Kitchen albuterol (PROVENTIL HFA;VENTOLIN HFA) 108 (90 BASE) MCG/ACT inhaler Inhale 2 puffs into the lungs every 6 (six) hours as needed. Shortness of Breath/COPD        Previous Psychotropic Medications:  None  Medication/Dose:  Prozac 10 mg daily       Substance Abuse History in the last 12 months:  yes  Consequences of Substance Abuse: Legal Consequences:  Court date in August for stealing beer Family Consequences:  Issues with her relationship Withdrawal Symptoms:   Tremors  Social History:  reports that she has been smoking Cigarettes.  She has a 15 pack-year smoking history. She does not have any smokeless tobacco history on file. She reports that she drinks about 3.6 ounces of alcohol per week. She reports that she uses illicit drugs (Marijuana and "Crack" cocaine). Additional Social History: Pain Medications: oxycodone Prescriptions: oxycodone   percocet   trazadone    Over the Counter: none History of alcohol / drug use?: Yes Longest period of sobriety (when/how long): 30 days Negative Consequences of Use: Financial Withdrawal Symptoms: Tremors  Current Place of Residence:   Place of Birth:   Family Members: Marital Status:  Separated Children:  Sons:  Daughters: Relationships: Education:  finished 7th grade, trying to get her GED Educational Problems/Performance: Religious Beliefs/Practices: History of Abuse (Emotional/Phsycial/Sexual) Occupational Experiences; Military History:  None. Legal History: Hobbies/Interests:  Family History:  History reviewed. No pertinent family history.  Results for orders placed during the hospital encounter of 07/24/12 (from the past 72 hour(s))  GLUCOSE, CAPILLARY     Status: Abnormal   Collection Time    07/24/12 12:22 PM      Result Value Range   Glucose-Capillary 135 (*) 70 - 99 mg/dL  GLUCOSE, CAPILLARY     Status: Abnormal   Collection Time    07/24/12  5:03 PM      Result Value Range   Glucose-Capillary 145 (*) 70 -  99 mg/dL   Comment 1 Notify RN    GLUCOSE, CAPILLARY     Status: Abnormal   Collection Time    07/24/12  9:14 PM      Result Value Range   Glucose-Capillary 156 (*) 70 - 99 mg/dL   Comment 1 Notify RN    GLUCOSE, CAPILLARY     Status: Abnormal   Collection Time    07/25/12  6:25 AM      Result Value Range   Glucose-Capillary 130 (*) 70 - 99 mg/dL   Comment 1 Notify RN     Psychological Evaluations:  Assessment:   AXIS I:  Alcohol Abuse, Anxiety Disorder NOS and Major Depression, Recurrent severe AXIS II:  Deferred AXIS III:   Past Medical History  Diagnosis Date  . Anxiety   . Bipolar 1 disorder   . Depression   . Liver disease   . Diabetes mellitus   . High cholesterol    AXIS IV:  economic problems, educational problems, housing problems, occupational problems, other psychosocial or environmental problems, problems related to legal system/crime, problems related to social environment and problems with primary support group AXIS V:  41-50 serious symptoms  Treatment Plan/Recommendations:  Plan:  Review of chart, vital signs, medications, and notes. 1-Admit for crisis management and stabilization.  Estimated length of stay 5-7 days past his current stay of 1 2-Individual and group therapy encouraged 3-Medication management for depression and alcohol withdrawal to reduce current symptoms to base line and improve the patient's overall level of functioning:  Medications reviewed with the patient and home medications reinstated and Librium alcohol detox protocol started 4-Coping skills for depression and alcohol abuse developing-- 5-Continue crisis stabilization and management 6-Address health issues--monitoring vital signs, stable 7-Treatment plan in progress to prevent relapse of depression and alcohol abuse 8-Psychosocial education regarding relapse prevention and self-care 8-Health care follow up as needed for any health concerns 9-Call for consult with hospitalist for  additional specialty patient services as needed.  Treatment Plan Summary: Daily contact with patient to assess and evaluate symptoms and progress in treatment Medication management Supportive approach/copign skills/identify stressors/CBT/optimize treatment with psychotropics Current Medications:  Current Facility-Administered Medications  Medication Dose Route Frequency Provider Last Rate Last Dose  . acetaminophen (TYLENOL) tablet 650 mg  650 mg Oral Q6H PRN Fransisca Kaufmann, NP      . albuterol (PROVENTIL HFA;VENTOLIN HFA) 108 (90 BASE) MCG/ACT inhaler 2 puff  2 puff Inhalation Q6H PRN Nanine Means, NP      . alum & mag hydroxide-simeth (MAALOX/MYLANTA) 200-200-20 MG/5ML suspension 30 mL  30 mL Oral Q4H PRN Fransisca Kaufmann, NP      . chlordiazePOXIDE (LIBRIUM) capsule 25 mg  25 mg Oral Q6H PRN Court Joy, PA-C   25 mg at 07/25/12 0657  . chlordiazePOXIDE (LIBRIUM) capsule 25 mg  25 mg Oral QID Court Joy, PA-C   25 mg at 07/25/12 4098   Followed by  . [START ON 07/26/2012] chlordiazePOXIDE (LIBRIUM) capsule 25 mg  25 mg Oral TID Court Joy, PA-C       Followed by  . [START ON 07/27/2012] chlordiazePOXIDE (LIBRIUM) capsule 25 mg  25 mg Oral BH-qamhs Court Joy, PA-C       Followed by  . [START ON 07/29/2012] chlordiazePOXIDE (LIBRIUM) capsule 25 mg  25 mg Oral Daily Court Joy, PA-C      . cloNIDine (CATAPRES) tablet 0.1 mg  0.1 mg Oral QID Court Joy, PA-C   0.1 mg at 07/25/12 0916   Followed by  . [START ON 07/27/2012] cloNIDine (CATAPRES) tablet 0.1 mg  0.1 mg Oral BH-qamhs Court Joy, PA-C       Followed by  . [START ON 07/30/2012] cloNIDine (CATAPRES) tablet 0.1 mg  0.1 mg Oral QAC breakfast Court Joy, PA-C      . dicyclomine (BENTYL) tablet 20 mg  20 mg Oral Q6H PRN Court Joy, PA-C      . gabapentin (NEURONTIN) capsule 300 mg  300 mg Oral BID Nanine Means, NP      . hydrOXYzine (ATARAX/VISTARIL) tablet 25 mg  25 mg Oral Q6H PRN Court Joy, PA-C       . loperamide (IMODIUM) capsule 2-4 mg  2-4 mg Oral PRN Court Joy, PA-C      . magnesium hydroxide (MILK OF MAGNESIA) suspension 30 mL  30 mL Oral Daily PRN Fransisca Kaufmann, NP      . metFORMIN (GLUCOPHAGE)  tablet 500 mg  500 mg Oral BID WC Nanine Means, NP      . methocarbamol (ROBAXIN) tablet 500 mg  500 mg Oral Q8H PRN Court Joy, PA-C      . multivitamin with minerals tablet 1 tablet  1 tablet Oral Daily Court Joy, PA-C   1 tablet at 07/25/12 0915  . naproxen (NAPROSYN) tablet 500 mg  500 mg Oral BID PRN Court Joy, PA-C      . ondansetron (ZOFRAN-ODT) disintegrating tablet 4 mg  4 mg Oral Q6H PRN Court Joy, PA-C      . thiamine (B-1) injection 100 mg  100 mg Intramuscular Once Court Joy, PA-C      . Melene Muller ON 07/26/2012] thiamine (VITAMIN B-1) tablet 100 mg  100 mg Oral Daily Court Joy, PA-C      . traZODone (DESYREL) tablet 50 mg  50 mg Oral QHS PRN Fransisca Kaufmann, NP   50 mg at 07/24/12 2328    Observation Level/Precautions:  15 minute checks  Laboratory:  Completed and reviewed, stable  Psychotherapy:  Individual and group therapy  Medications:  See PTA  Consultations:  None  Discharge Concerns:  None    Estimated LOS:  5-7 days  Other:     I certify that inpatient services furnished can reasonably be expected to improve the patient's condition.   Nanine Means, PMH-NP 7/14/201411:21 AM Agree with assessment and plan Madie Reno A. Dub Mikes, M.D.

## 2012-07-26 DIAGNOSIS — F1120 Opioid dependence, uncomplicated: Secondary | ICD-10-CM

## 2012-07-26 DIAGNOSIS — F1020 Alcohol dependence, uncomplicated: Secondary | ICD-10-CM

## 2012-07-26 DIAGNOSIS — F339 Major depressive disorder, recurrent, unspecified: Secondary | ICD-10-CM

## 2012-07-26 LAB — GLUCOSE, CAPILLARY: Glucose-Capillary: 128 mg/dL — ABNORMAL HIGH (ref 70–99)

## 2012-07-26 MED ORDER — FLUOXETINE HCL 20 MG PO CAPS
20.0000 mg | ORAL_CAPSULE | Freq: Every day | ORAL | Status: DC
  Administered 2012-07-27 – 2012-07-28 (×2): 20 mg via ORAL
  Filled 2012-07-26: qty 1
  Filled 2012-07-26: qty 14
  Filled 2012-07-26 (×2): qty 1

## 2012-07-26 NOTE — Progress Notes (Signed)
Recreation Therapy Notes  Date: 07.15.2014 Time: 3:00pm Location: [address] Day[address]   Group Topic/Focus: Problem Solving, Communication  Participation Level:  Did not attend.  Marykay Lex Cinde Ebert, LRT/CTRS  Jearl Klinefelter 07/26/2012 4:44 PM

## 2012-07-26 NOTE — Progress Notes (Signed)
Recreation Therapy Notes  Date: 07.15.2014 Time: 2:30pm Location: [address]      Group Topic/Focus: Musician (AAA/T)  Session: Animal Assisted Activities  Dog Team: [facility] & handler  Participation Level: Did not attend  [employer], LRT/CTRS  Jearl Klinefelter 07/26/2012 4:50 PM

## 2012-07-26 NOTE — Progress Notes (Signed)
[facility] MD Progress Note  07/26/2012 4:26 PM Julie Burnett  MRN:  [id number] Subjective:  Julie Burnett endorses that she is having a hard time. She has not been able to achieve long term sobriety. She was doing well with the Prozac but went off it. Saw the Depression getting worst. Became suicidal. She has been staying with her daughter and her boyfriend and they drink. Being in that environment has affected her ability to stay sober. Admits to persistent alcohol cravings. She was once on Campral but could not afford it Diagnosis:  Major Depression recurrent, Alcohol Dependence, Opioid Dependence  ADL's:  Intact  Sleep: Fair  Appetite:  Fair  Suicidal Ideation:  Plan:  denies Intent:  denies Means:  denies Homicidal Ideation:  Plan:  denies Intent:  denies Means:  denies AEB (as evidenced by):  Psychiatric Specialty Exam: Review of Systems  Constitutional: Positive for malaise/fatigue.  HENT: Negative.   Eyes: Negative.   Respiratory: Negative.   Cardiovascular: Negative.   Gastrointestinal: Negative.   Genitourinary: Negative.   Musculoskeletal: Negative.   Skin: Negative.   Neurological: Positive for weakness.  Endo/Heme/Allergies: Negative.   Psychiatric/Behavioral: Positive for depression and substance abuse. The patient is nervous/anxious and has insomnia.     Blood pressure 92/71, pulse 80, temperature 98.6 F (37 C), temperature source Oral, resp. rate 18, height 5\' 5"  (1.651 m), weight 87.091 kg (192 lb), last menstrual period 06/23/2012, SpO2 98.00%.Body mass index is 31.95 kg/(m^2).  General Appearance: Fairly Groomed  Patent attorney::  Fair  Speech:  Clear and Coherent and Slow  Volume:  Decreased  Mood:  Anxious and Depressed  Affect:  Restricted  Thought Process:  Coherent and Goal Directed  Orientation:  Full (Time, Place, and Person)  Thought Content:  worries, concenrs, fear of relapsing  Suicidal Thoughts:  intermittent  Homicidal Thoughts:  No  Memory:   Immediate;   Fair Recent;   Fair Remote;   Fair  Judgement:  Fair  Insight:  Present  Psychomotor Activity:  Restlessness  Concentration:  Fair  Recall:  Fair  Akathisia:  No  Handed:  Right  AIMS (if indicated):     Assets:  Desire for Improvement  Sleep:  Number of Hours: 6.75   Current Medications: Current Facility-Administered Medications  Medication Dose Route Frequency Provider Last Rate Last Dose  . acetaminophen (TYLENOL) tablet 650 mg  650 mg Oral Q6H PRN Fransisca Kaufmann, NP      . albuterol (PROVENTIL HFA;VENTOLIN HFA) 108 (90 BASE) MCG/ACT inhaler 2 puff  2 puff Inhalation Q6H PRN Nanine Means, NP      . alum & mag hydroxide-simeth (MAALOX/MYLANTA) 200-200-20 MG/5ML suspension 30 mL  30 mL Oral Q4H PRN Fransisca Kaufmann, NP      . chlordiazePOXIDE (LIBRIUM) capsule 25 mg  25 mg Oral Q6H PRN Court Joy, PA-C   25 mg at 07/25/12 0657  . chlordiazePOXIDE (LIBRIUM) capsule 25 mg  25 mg Oral QID Court Joy, PA-C   25 mg at 07/26/12 1200   Followed by  . chlordiazePOXIDE (LIBRIUM) capsule 25 mg  25 mg Oral TID Court Joy, PA-C       Followed by  . [START ON 07/27/2012] chlordiazePOXIDE (LIBRIUM) capsule 25 mg  25 mg Oral BH-qamhs Court Joy, PA-C       Followed by  . [START ON 07/29/2012] chlordiazePOXIDE (LIBRIUM) capsule 25 mg  25 mg Oral Daily Court Joy, PA-C      . cloNIDine (CATAPRES)  tablet 0.1 mg  0.1 mg Oral QID Court Joy, PA-C   0.1 mg at 07/26/12 1610   Followed by  . [START ON 07/27/2012] cloNIDine (CATAPRES) tablet 0.1 mg  0.1 mg Oral BH-qamhs Court Joy, PA-C       Followed by  . [START ON 07/30/2012] cloNIDine (CATAPRES) tablet 0.1 mg  0.1 mg Oral QAC breakfast Court Joy, PA-C      . dicyclomine (BENTYL) tablet 20 mg  20 mg Oral Q6H PRN Court Joy, PA-C      . [START ON 07/27/2012] FLUoxetine (PROZAC) capsule 20 mg  20 mg Oral Daily Rachael Fee, MD      . gabapentin (NEURONTIN) capsule 300 mg  300 mg Oral BID Nanine Means, NP    300 mg at 07/26/12 0853  . hydrOXYzine (ATARAX/VISTARIL) tablet 25 mg  25 mg Oral Q6H PRN Court Joy, PA-C   25 mg at 07/25/12 2020  . loperamide (IMODIUM) capsule 2-4 mg  2-4 mg Oral PRN Court Joy, PA-C      . magnesium hydroxide (MILK OF MAGNESIA) suspension 30 mL  30 mL Oral Daily PRN Fransisca Kaufmann, NP      . metFORMIN (GLUCOPHAGE) tablet 500 mg  500 mg Oral BID WC Nanine Means, NP   500 mg at 07/26/12 0852  . methocarbamol (ROBAXIN) tablet 500 mg  500 mg Oral Q8H PRN Court Joy, PA-C      . multivitamin with minerals tablet 1 tablet  1 tablet Oral Daily Court Joy, PA-C   1 tablet at 07/26/12 9604  . naproxen (NAPROSYN) tablet 500 mg  500 mg Oral BID PRN Court Joy, PA-C      . nicotine (NICODERM CQ - dosed in mg/24 hours) patch 21 mg  21 mg Transdermal Daily Rachael Fee, MD   21 mg at 07/26/12 5409  . ondansetron (ZOFRAN-ODT) disintegrating tablet 4 mg  4 mg Oral Q6H PRN Court Joy, PA-C      . thiamine (B-1) injection 100 mg  100 mg Intramuscular Once Court Joy, PA-C      . thiamine (VITAMIN B-1) tablet 100 mg  100 mg Oral Daily Court Joy, PA-C   100 mg at 07/26/12 0853  . traZODone (DESYREL) tablet 50 mg  50 mg Oral QHS PRN Fransisca Kaufmann, NP   50 mg at 07/24/12 2328    Lab Results:  Results for orders placed during the hospital encounter of 07/24/12 (from the past 48 hour(s))  GLUCOSE, CAPILLARY     Status: Abnormal   Collection Time    07/24/12  5:03 PM      Result Value Range   Glucose-Capillary 145 (*) 70 - 99 mg/dL   Comment 1 Notify RN    GLUCOSE, CAPILLARY     Status: Abnormal   Collection Time    07/24/12  9:14 PM      Result Value Range   Glucose-Capillary 156 (*) 70 - 99 mg/dL   Comment 1 Notify RN    GLUCOSE, CAPILLARY     Status: Abnormal   Collection Time    07/25/12  6:25 AM      Result Value Range   Glucose-Capillary 130 (*) 70 - 99 mg/dL   Comment 1 Notify RN    GLUCOSE, CAPILLARY     Status: Abnormal   Collection Time     07/25/12 11:45 AM      Result Value Range  Glucose-Capillary 134 (*) 70 - 99 mg/dL   Comment 1 Notify RN    GLUCOSE, CAPILLARY     Status: Abnormal   Collection Time    07/25/12  5:32 PM      Result Value Range   Glucose-Capillary 155 (*) 70 - 99 mg/dL  GLUCOSE, CAPILLARY     Status: Abnormal   Collection Time    07/25/12  9:03 PM      Result Value Range   Glucose-Capillary 143 (*) 70 - 99 mg/dL   Comment 1 Notify RN    GLUCOSE, CAPILLARY     Status: Abnormal   Collection Time    07/26/12  6:22 AM      Result Value Range   Glucose-Capillary 128 (*) 70 - 99 mg/dL   Comment 1 Notify RN      Physical Findings: AIMS: Facial and Oral Movements Muscles of Facial Expression: None, normal Lips and Perioral Area: None, normal Jaw: None, normal Tongue: None, normal,Extremity Movements Upper (arms, wrists, hands, fingers): None, normal Lower (legs, knees, ankles, toes): None, normal, Trunk Movements Neck, shoulders, hips: None, normal, Overall Severity Severity of abnormal movements (highest score from questions above): None, normal Incapacitation due to abnormal movements: None, normal Patient's awareness of abnormal movements (rate only patient's report): No Awareness, Dental Status Current problems with teeth and/or dentures?: Yes (various cavities) Does patient usually wear dentures?: No  CIWA:  CIWA-Ar Total: 1 COWS:  COWS Total Score: 3  Treatment Plan Summary: Daily contact with patient to assess and evaluate symptoms and progress in treatment Medication management  Plan: Supportive approach/coping skills/relapse prevention           Pursue the detox protocol           Reassess and address the co morbidities           Increase Prozac to 20 mg daily           Optimize the Neurontin dosage to help with the cravings Medical Decision Making Problem Points:  Review of psycho-social stressors (1) Data Points:  Review of medication regiment & side effects (2) Review of  new medications or change in dosage (2)  I certify that inpatient services furnished can reasonably be expected to improve the patient's condition.   Dung Salinger A 07/26/2012, 4:26 PM

## 2012-07-26 NOTE — Progress Notes (Signed)
Pt was up to the med window asking for gatorade.  Pt also received a Vistaril at this time.  Pt says she has felt bad all day and has not gone to any groups.  She says her withdrawal symptoms are moderate.  Discussed her HS medications with pt.  Pt voices understanding.  Pt says she has fleeting thoughts of suicide at times, but contracts for safety with staff.  Pt denies HI/AV.  Pt voices no other needs at this time.  Support and encouragement offered.  Safety maintained with q15 minute checks.

## 2012-07-26 NOTE — Progress Notes (Signed)
D:Pt is irritable & has a flat affect & depressed mood.Pt. C/o anxiety. CIWA & COWS=2. Pt. Is hopeless & helpless. A: Pt supported & encouraged. Pt was medicated with vistaril 25 mg. @ 1722.Continues on 15 minute checks.Pt safety maintained. relief noted.

## 2012-07-26 NOTE — [facility] Group Notes (Signed)
[facility] LCSW Aftercare Discharge Planning Group Note   07/26/2012 10:14 AM  Participation Quality: Minimal   Mood/Affect:  Depressed  Depression Rating:  10  Anxiety Rating:  6  Thoughts of Suicide:  No Will you contract for safety?   NA  Current AVH:  No  Plan for Discharge/Comments:  Pt given [facility] referral form. CSW to send today. Pt currently homeless and is from [facility].   Transportation Means: unknown/bus  Supports: none identified by pt.   Smart, [employer]

## 2012-07-26 NOTE — Progress Notes (Signed)
Adult Psychoeducational Group Note  Date:  07/26/2012 Time:  12:05 PM  Group Topic/Focus:  Recovery Goals:   The focus of this group is to identify appropriate goals for recovery and establish a plan to achieve them.  Participation Level:  Minimal  Participation Quality:  Attentive  Affect:  Blunted and Flat  Cognitive:  Appropriate  Insight: Good  Engagement in Group:  Lacking  Modes of Intervention:  Activity, Discussion, Socialization and Support  Additional Comments:  Pt came to group and only shared when prompted that she wanted to change her medication mismanagement and lack of supportive and positive friends. She plans on changing this by taking her medications and finding a job.  The purpose of this group was for the pt to identify two things in their life that is standing between them and recovery. The pt then thought of and shared their specific and measurable goals to address the changes they wanted to see in their life. The goals impacted personal recovery.     Julie Burnett 07/26/2012, 12:05 PM

## 2012-07-26 NOTE — [facility] Suicide Risk Assessment (Signed)
[facility] INPATIENT:  Family/Significant Other Suicide Prevention Education  Suicide Prevention Education:  Patient Refusal for Family/Significant Other Suicide Prevention Education: The patient Julie Burnett has refused to provide written consent for family/significant other to be provided Family/Significant Other Suicide Prevention Education during admission and/or prior to discharge.  Physician notified.  Azul refused to sign consent for SPE to be completed with family/friend. SPE completed with pt. She was provided with SPI pamphlet and encouraged to ask any questions and talk about concerns. Pt also provided with mobile crisis number.   Smart, [name] 07/26/2012, 12:15 PM

## 2012-07-26 NOTE — [facility] Group Notes (Signed)
[facility] LCSW Group Therapy  07/26/2012 3:27 PM  Type of Therapy:  Group Therapy  Participation Level:  Did Not Attend   Smart, Herbert Seta 07/26/2012, 3:27 PM

## 2012-07-26 NOTE — Discharge Planning (Signed)
Julie Burnett from [facility] stated that there are no beds available currently. Pt interested in [facility] being that she is homeless in [facility] currently. CSW called and left msg for Tiana at [facility] requesting admit date.

## 2012-07-26 NOTE — Progress Notes (Signed)
Went to pt's room to have pt come get her night time meds.  Pt said she did not want them at this time, but she may need something later in the night.  RN was unsuccessful in convincing pt to take the meds so that she would hopefully not need to get up.  Pt still refused.  Will continue to monitor.

## 2012-07-27 MED ORDER — GABAPENTIN 300 MG PO CAPS
300.0000 mg | ORAL_CAPSULE | Freq: Four times a day (QID) | ORAL | Status: DC
  Administered 2012-07-27 – 2012-07-28 (×3): 300 mg via ORAL
  Filled 2012-07-27: qty 1
  Filled 2012-07-27 (×2): qty 56
  Filled 2012-07-27: qty 1
  Filled 2012-07-27 (×2): qty 56
  Filled 2012-07-27 (×5): qty 1

## 2012-07-27 MED ORDER — QUETIAPINE FUMARATE 50 MG PO TABS
50.0000 mg | ORAL_TABLET | Freq: Every day | ORAL | Status: DC
  Administered 2012-07-27: 50 mg via ORAL
  Filled 2012-07-27: qty 1
  Filled 2012-07-27: qty 14
  Filled 2012-07-27: qty 1

## 2012-07-27 NOTE — BHH Group Notes (Signed)
[facility] LCSW Aftercare Discharge Planning Group Note   07/27/2012 9:30 AM  Participation Quality:  Minimal  Mood/Affect:  Depressed  Depression Rating:  6  Anxiety Rating:  3  Thoughts of Suicide:  No Will you contract for safety?   NA  Current AVH:  No  Plan for Discharge/Comments:  Pt has [facility] date of 7/22 (Tues) and plans to followup at [facility] for med management. There is currently no bed available at [facility] but she is on waiting list. Pt given number and info to [facility] as potential place to stay until [facility] date. Pt encouraged to call this number today.   Transportation Means: bus pass  Supports: limited supports/no specific supports identified.   Smart, [employer]

## 2012-07-27 NOTE — Tx Team (Signed)
Interdisciplinary Treatment Plan Update (Adult)  Date: 07/27/2012   Time Reviewed: 10:32 AM  Progress in Treatment:  Attending groups: yes Participating in groups: Minimally   Taking medication as prescribed: Yes  Tolerating medication: Yes  Family/Significant othe contact made: No. Pt refused to consent to family contact. SPE completed with pt.  Patient understands diagnosis: Yes, AEB seeking treatment for SI, ETOH detox, depression, and medication stabilization. Discussing patient identified problems/goals with staff: Yes  Medical problems stabilized or resolved: Yes  Denies suicidal/homicidal ideation: Yes  Patient has not harmed self or Others: Yes  New problem(s) identified: Discharge Plan or Barriers: Pt hoping to get into [facility]. Referral sent by CSW yesterday and pt put on waiting list. Pt has [facility] date of 7/22 (Tues) and will follow up with [facility] for med management. She was given information to [facility] recovery shelter in [place] to find out about bed availability until her [facility] admission.  Additional comments:Julie Burnett is an 42 y.o. female. She presents voluntarily to the Emergency Department stating that she is going to harm herself by taking an overdose of her medications. She reports that she stopped taking her medications about one month ago. She states that "I felt better so I stopped taking them." She reports that [facility] took her off the Zoloft due to some side effects that she was experiencing and switched her to Prozac, which she stated worked for her. But then, she decided to stop talking her medications. She reports that she feels down, sad and doesn't want to "do anything." She reports that she has been having thoughts of harming her self for "awhile." She will not contract for safety. She reports that she is also taking opiates every day; however her drug screen is negative. When she was told that her drug screen is negative, she stated that it couldn't be right,  because she reports that she is buying oxycodone's "off the street" daily- 2-3 pills per day. She also reports that she drinks 2-3 40 ounce beers per day but today she only drank a 12 ounce beer. She does have a court date, which appears to be related to her substance abuse issue. She states she has a court date for August 20 for misdemeanor larceny; which she explains that she stole beer from [place]. She doesn't appear to be in any distress and no signs of withdrawal are noted at this time. She is not experiencing any delusions or hallucinations at this time. She denies any homicidal ideation.   Reason for Continuation of Hospitalization: Librium taper-withdrawals  Medication management Mood stabilization Estimated length of stay: 1-2 days For review of initial/current patient goals, please see plan of care.  Attendees:  Patient:    Family:    Physician: Geoffery Lyons MD 07/27/2012 10:38 AM   Nursing: Philippa Chester RN  07/27/2012 10:38 AM   Clinical Social Worker Tyrese Ficek Smart, LCSWA  07/27/2012 10:38 AM   Other: Lupita Leash RN 07/27/2012 10:38 AM   Other: Aggie PA 07/27/2012 10:38 AM   Other: Darden Dates Nurse CM  07/27/2012 10:38 AM   Other:    Scribe for Treatment Team:  [employer] LCSWA 07/27/2012 10:38 AM

## 2012-07-27 NOTE — Progress Notes (Signed)
Patient ID: KRISLYNN GRONAU, female   DOB: Jul 30, 1970, 42 y.o.   MRN: [id number] She has been up and to two groups poor to no interaction with peers and staff. Will just give short answers to questions. She has to be encouraged  Get out of bed for medications meals and groups. Says that she is tired. Self inventory: depression 8, hopelessness 3 both were 10's yesterday. She denies SI thoughts today and reports no pain.  Withdrawal symptoms of tremors and chilling reported this AM. None reperted at this time.

## 2012-07-27 NOTE — Progress Notes (Signed)
Patient ID: Julie Burnett, female   DOB: 12/26/1970, 42 y.o.   MRN: [id number]  D: Patient lying in bed with eyes closed. Respirations even and non-labored. A: Staff will monitor on q 15 minute checks, follow treatment plan, and give meds as ordered. R: Appears asleep

## 2012-07-27 NOTE — Progress Notes (Signed)
Taken by MHT; Dorothy 

## 2012-07-27 NOTE — BHH Group Notes (Signed)
[facility] LCSW Group Therapy  07/27/2012 2:53 PM  Type of Therapy:  Group Therapy  Participation Level:  Did Not Attend  Smart, Herbert Seta 07/27/2012, 2:53 PM

## 2012-07-27 NOTE — Progress Notes (Signed)
Patient ID: Julie Burnett, female   DOB: November 30, 1970, 42 y.o.   MRN: [id number] [facility] MD Progress Note  07/27/2012 2:02 PM Julie Burnett  MRN:  [id number]  Subjective:  Julie Burnett endorses that she did not go to the afternoon group because she is feeling down. She continue to endorse suicidal ideations (fleeting), however, denies any intent and or plans to hurt self. Is still considering getting Seroquel Q bedtime. Hoping it will help improve her mood and help with sleep. Continue to endorse high anxiety at #7.  Diagnosis:  Major Depression recurrent, Alcohol Dependence, Opioid Dependence  ADL's:  Intact  Sleep: Fair  Appetite:  Fair  Suicidal Ideation:  Plan:  denies Intent:  denies Means:  denies Homicidal Ideation:  Plan:  denies Intent:  denies Means:  denies AEB (as evidenced by):  Psychiatric Specialty Exam: Review of Systems  Constitutional: Positive for malaise/fatigue.  HENT: Negative.   Eyes: Negative.   Respiratory: Negative.   Cardiovascular: Negative.   Gastrointestinal: Negative.   Genitourinary: Negative.   Musculoskeletal: Negative.   Skin: Negative.   Neurological: Positive for weakness.  Endo/Heme/Allergies: Negative.   Psychiatric/Behavioral: Positive for depression and substance abuse. The patient is nervous/anxious and has insomnia.     Blood pressure 94/67, pulse 75, temperature 97 F (36.1 C), temperature source Oral, resp. rate 18, height 5\' 5"  (1.651 m), weight 87.091 kg (192 lb), last menstrual period 06/23/2012, SpO2 98.00%.Body mass index is 31.95 kg/(m^2).  General Appearance: Fairly Groomed  Patent attorney::  Fair  Speech:  Clear and Coherent and Slow  Volume:  Decreased  Mood:  Anxious and Depressed  Affect:  Restricted  Thought Process:  Coherent and Goal Directed  Orientation:  Full (Time, Place, and Person)  Thought Content:  worries, concenrs, fear of relapsing  Suicidal Thoughts:  intermittent  Homicidal Thoughts:  No  Memory:   Immediate;   Fair Recent;   Fair Remote;   Fair  Judgement:  Fair  Insight:  Present  Psychomotor Activity:  Restlessness  Concentration:  Fair  Recall:  Fair  Akathisia:  No  Handed:  Right  AIMS (if indicated):     Assets:  Desire for Improvement  Sleep:  Number of Hours: 6   Current Medications: Current Facility-Administered Medications  Medication Dose Route Frequency Provider Last Rate Last Dose  . acetaminophen (TYLENOL) tablet 650 mg  650 mg Oral Q6H PRN Fransisca Kaufmann, NP      . albuterol (PROVENTIL HFA;VENTOLIN HFA) 108 (90 BASE) MCG/ACT inhaler 2 puff  2 puff Inhalation Q6H PRN Nanine Means, NP      . alum & mag hydroxide-simeth (MAALOX/MYLANTA) 200-200-20 MG/5ML suspension 30 mL  30 mL Oral Q4H PRN Fransisca Kaufmann, NP      . chlordiazePOXIDE (LIBRIUM) capsule 25 mg  25 mg Oral Q6H PRN Court Joy, PA-C   25 mg at 07/25/12 0657  . chlordiazePOXIDE (LIBRIUM) capsule 25 mg  25 mg Oral BH-qamhs Court Joy, PA-C       Followed by  . [START ON 07/29/2012] chlordiazePOXIDE (LIBRIUM) capsule 25 mg  25 mg Oral Daily Court Joy, PA-C      . cloNIDine (CATAPRES) tablet 0.1 mg  0.1 mg Oral QID Court Joy, PA-C   0.1 mg at 07/26/12 2201   Followed by  . cloNIDine (CATAPRES) tablet 0.1 mg  0.1 mg Oral BH-qamhs Court Joy, PA-C       Followed by  . [START ON 07/30/2012] cloNIDine (CATAPRES)  tablet 0.1 mg  0.1 mg Oral QAC breakfast Court Joy, PA-C      . dicyclomine (BENTYL) tablet 20 mg  20 mg Oral Q6H PRN Court Joy, PA-C      . FLUoxetine (PROZAC) capsule 20 mg  20 mg Oral Daily Rachael Fee, MD   20 mg at 07/27/12 0850  . gabapentin (NEURONTIN) capsule 300 mg  300 mg Oral BID Nanine Means, NP   300 mg at 07/27/12 0851  . hydrOXYzine (ATARAX/VISTARIL) tablet 25 mg  25 mg Oral Q6H PRN Court Joy, PA-C   25 mg at 07/26/12 1724  . loperamide (IMODIUM) capsule 2-4 mg  2-4 mg Oral PRN Court Joy, PA-C   4 mg at 07/27/12 1210  . magnesium hydroxide (MILK  OF MAGNESIA) suspension 30 mL  30 mL Oral Daily PRN Fransisca Kaufmann, NP      . metFORMIN (GLUCOPHAGE) tablet 500 mg  500 mg Oral BID WC Nanine Means, NP   500 mg at 07/27/12 0850  . methocarbamol (ROBAXIN) tablet 500 mg  500 mg Oral Q8H PRN Court Joy, PA-C      . multivitamin with minerals tablet 1 tablet  1 tablet Oral Daily Court Joy, PA-C   1 tablet at 07/27/12 4540  . naproxen (NAPROSYN) tablet 500 mg  500 mg Oral BID PRN Court Joy, PA-C      . nicotine (NICODERM CQ - dosed in mg/24 hours) patch 21 mg  21 mg Transdermal Daily Rachael Fee, MD   21 mg at 07/27/12 0851  . ondansetron (ZOFRAN-ODT) disintegrating tablet 4 mg  4 mg Oral Q6H PRN Court Joy, PA-C      . thiamine (B-1) injection 100 mg  100 mg Intramuscular Once Court Joy, PA-C      . thiamine (VITAMIN B-1) tablet 100 mg  100 mg Oral Daily Court Joy, PA-C   100 mg at 07/27/12 0850  . traZODone (DESYREL) tablet 50 mg  50 mg Oral QHS PRN Fransisca Kaufmann, NP   50 mg at 07/26/12 2204    Lab Results:  Results for orders placed during the hospital encounter of 07/24/12 (from the past 48 hour(s))  GLUCOSE, CAPILLARY     Status: Abnormal   Collection Time    07/25/12  5:32 PM      Result Value Range   Glucose-Capillary 155 (*) 70 - 99 mg/dL  GLUCOSE, CAPILLARY     Status: Abnormal   Collection Time    07/25/12  9:03 PM      Result Value Range   Glucose-Capillary 143 (*) 70 - 99 mg/dL   Comment 1 Notify RN    GLUCOSE, CAPILLARY     Status: Abnormal   Collection Time    07/26/12  6:22 AM      Result Value Range   Glucose-Capillary 128 (*) 70 - 99 mg/dL   Comment 1 Notify RN    GLUCOSE, CAPILLARY     Status: Abnormal   Collection Time    07/27/12  6:25 AM      Result Value Range   Glucose-Capillary 121 (*) 70 - 99 mg/dL    Physical Findings: AIMS: Facial and Oral Movements Muscles of Facial Expression: None, normal Lips and Perioral Area: None, normal Jaw: None, normal Tongue: None,  normal,Extremity Movements Upper (arms, wrists, hands, fingers): None, normal Lower (legs, knees, ankles, toes): None, normal, Trunk Movements Neck, shoulders, hips: None, normal, Overall Severity Severity  of abnormal movements (highest score from questions above): None, normal Incapacitation due to abnormal movements: None, normal Patient's awareness of abnormal movements (rate only patient's report): No Awareness, Dental Status Current problems with teeth and/or dentures?: Yes Does patient usually wear dentures?: No  CIWA:  CIWA-Ar Total: 4 COWS:  COWS Total Score: 0  Treatment Plan Summary: Daily contact with patient to assess and evaluate symptoms and progress in treatment Medication management  Plan: Supportive approach/coping skills/relapse prevention Pursue the detox protocol Reassess and address the co morbidities Increase Prozac to 20 mg daily Neurontin from 300 mg tid to Qid to help with the cravings. Add Trazodone 50 mg Q bedtime for anxiety/sleep.  Medical Decision Making Problem Points:  Review of psycho-social stressors (1) Data Points:  Review of medication regiment & side effects (2) Review of new medications or change in dosage (2)  I certify that inpatient services furnished can reasonably be expected to improve the patient's condition.   Armandina Stammer I, PMHNP-BC 07/27/2012, 2:02 PM

## 2012-07-27 NOTE — Progress Notes (Signed)
Patient ID: Julie Burnett, female   DOB: 08/26/1970, 42 y.o.   MRN: [id number] Pt did not attend group. Stayed in her bed

## 2012-07-28 DIAGNOSIS — F332 Major depressive disorder, recurrent severe without psychotic features: Principal | ICD-10-CM

## 2012-07-28 MED ORDER — ALBUTEROL SULFATE HFA 108 (90 BASE) MCG/ACT IN AERS: 2.0000 | INHALATION_SPRAY | Freq: Four times a day (QID) | RESPIRATORY_TRACT | Status: DC | PRN

## 2012-07-28 MED ORDER — METFORMIN HCL 500 MG PO TABS: 500.0000 mg | ORAL_TABLET | Freq: Two times a day (BID) | ORAL | Status: DC

## 2012-07-28 MED ORDER — FLUOXETINE HCL 20 MG PO CAPS: 20.0000 mg | ORAL_CAPSULE | Freq: Every day | ORAL | Status: DC

## 2012-07-28 MED ORDER — GABAPENTIN 300 MG PO CAPS: 300.0000 mg | ORAL_CAPSULE | Freq: Four times a day (QID) | ORAL | Status: DC

## 2012-07-28 MED ORDER — TRAZODONE HCL 50 MG PO TABS: 50.0000 mg | ORAL_TABLET | Freq: Every evening | ORAL | Status: DC | PRN

## 2012-07-28 MED ORDER — QUETIAPINE FUMARATE 50 MG PO TABS: 50.0000 mg | ORAL_TABLET | Freq: Every day | ORAL | Status: DC

## 2012-07-28 NOTE — Progress Notes (Signed)
D/C instructions/meds/follow-up appointments reviewed, pt verbalized understanding, pt's belongings returned to pt, samples given. 

## 2012-07-28 NOTE — Discharge Summary (Signed)
Physician Discharge Summary Note  Patient:  Julie Burnett is an 42 y.o., female MRN:  [id number] DOB:  04/01/1970 Patient phone:  [phone] (home)  Patient address:   [address] [place] [address],   Date of Admission:  07/24/2012 Date of Discharge: 07/28/12  Reason for Admission:  Drug addiction, requiring detox  Discharge Diagnoses: Principal Problem:   Major depressive disorder, recurrent episode, severe, without mention of psychotic behavior Active Problems:   Alcohol withdrawal   Alcohol dependence  Review of Systems  Constitutional: Negative.   HENT: Negative.   Eyes: Negative.   Respiratory: Negative.   Cardiovascular: Negative.   Gastrointestinal: Negative.   Genitourinary: Negative.   Musculoskeletal: Negative.   Skin: Negative.   Neurological: Negative.   Endo/Heme/Allergies: Negative.   Psychiatric/Behavioral: Positive for depression (Stabilized with medication prior to discharge) and substance abuse (Alcoholism). Negative for suicidal ideas, hallucinations and memory loss. The patient is nervous/anxious (Stabilized with medication prior to discharge) and has insomnia (Stabilized with medication prior to discharge).    Axis Diagnosis:   AXIS I:  Opioid dependence, Alcohol dependence, Major depressive disorder, recurrent episode, severe, without mention of psychotic behavior AXIS II:  Deferred AXIS III:   Past Medical History  Diagnosis Date  . Anxiety   . Bipolar 1 disorder   . Depression   . Liver disease   . Diabetes mellitus   . High cholesterol    AXIS IV:  economic problems, housing problems, occupational problems, other psychosocial or environmental problems and Substance abuse AXIS V:  63  Level of Care:  [facility]  Hospital Course:  Kyanna was feeling "good" a month ago and stopped taking her Prozac that was working for her and her depression increased to the point of her having suicidal ideations. She has had an intentional overdose in  the past and has been in many rehabs for alcohol dependency. Her last sobriety was in February and she was sober for one month. At this time, she was living in a halfway house from November to [place] had been "kicked out twice" for non-compliance. Sharell then moved in with her 38 year-ol daughter and her boyfriend and started drinking again; her daughter drinks daily and her boyfriend is an "alcoholic". She was in school to get her GED but not at the present time. Her mother is a good support system for her but she lives in [place]. Ashleigh's medications improve her depression and assist with her alcohol abuse, being alone and not taking her medications increase her depression and drinking. Patient did not feel like leaving her bed due to withdrawal symptoms. Depressed and tearful, decreased energy, depressed affect, decreased energy.  Upon admission into this hospital, and after admission assessment/evaluation coupled with UDS/Toxicology reports, it was determined that Ms. Degroote will need detoxification treatment protocol to stabilize her system of alcohol/drug intoxication and to combat the withdrawal symptoms of these substances as well.  Ms. Symonds was started on Librium treatment protocol. She was also enrolled in group counseling sessions and activities where she was taught, counseled and learned coping skills that should help her after discharge to cope better and manage her substance abuse issues to sustain a much longer sobriety. She also attended AA/NA meetings being offered and held on this unit. She has some previously existing and or identifiable medical conditions that required treatment and or monitoring. She received medication management for all those health issues as well. She was monitored closely for any potential problems that may arise as a  result of and or during detoxification treatment. Patient tolerated her treatment regimen and detoxification treatment protocol without any significant  adverse effects and or reactions presented.  Besides detoxification treatment protocol, Ms. Misuraca also received Fluoxetine 20 mg daily for depression, gabapentin 300 mg tid for anxiety/pain management, Seroquel 50 mg Q bedtime for mood control/anxiety and Trazodone 50 mg Q bedtime for sleep (Insomnia). Patient attended treatment team meeting this am and met with the treatment team members. Her reason for admission, present symptoms, substance abuse issues, response to treatment and discharge plans discussed. Patient endorsed that she is doing well and stable for discharge to pursue the next phase of her substance abuse treatment. It was then agreed upon that she will follow-up care at the [facility] clinic here in [place], [place] for routine psychiatric care and medication management. She has been instructed and informed that this is a walk-in appointment between the hours of 08:00 - 09:00 am, Monday thru Friday. And for substance abuse treatment, patient will resume treatment at the [facility] in [place], [place] on 08/02/12 at 08:00 am. The addresses, dates, times and contact information for the clinic and treatment center provided for patient in writing.  Upon discharge, patient adamantly denies suicidal, homicidal ideations, auditory, visual hallucinations, delusional thougts and or withdrawal symptoms. Patient left [facility] with all personal belongings in no apparent distress. She received 2 weeks worth supply samples of her discharge medications provided by [facility] pharmacy. Transportation per family.   Consults:  psychiatry  Significant Diagnostic Studies:  labs: CBC with diff, CMP, UDS, Toxicology tests, U/A  Discharge Vitals:   Blood pressure 103/66, pulse 80, temperature 98 F (36.7 C), temperature source Oral, resp. rate 18, height 5\' 5"  (1.651 m), weight 87.091 kg (192 lb), last menstrual period 06/23/2012, SpO2 98.00%. Body mass index is 31.95 kg/(m^2). Lab Results:   Results for orders placed  during the hospital encounter of 07/24/12 (from the past 72 hour(s))  GLUCOSE, CAPILLARY     Status: Abnormal   Collection Time    07/25/12  5:32 PM      Result Value Range   Glucose-Capillary 155 (*) 70 - 99 mg/dL  GLUCOSE, CAPILLARY     Status: Abnormal   Collection Time    07/25/12  9:03 PM      Result Value Range   Glucose-Capillary 143 (*) 70 - 99 mg/dL   Comment 1 Notify RN    GLUCOSE, CAPILLARY     Status: Abnormal   Collection Time    07/26/12  6:22 AM      Result Value Range   Glucose-Capillary 128 (*) 70 - 99 mg/dL   Comment 1 Notify RN    GLUCOSE, CAPILLARY     Status: Abnormal   Collection Time    07/27/12  6:25 AM      Result Value Range   Glucose-Capillary 121 (*) 70 - 99 mg/dL    Physical Findings: AIMS: Facial and Oral Movements Muscles of Facial Expression: None, normal Lips and Perioral Area: None, normal Jaw: None, normal Tongue: None, normal,Extremity Movements Upper (arms, wrists, hands, fingers): None, normal Lower (legs, knees, ankles, toes): None, normal, Trunk Movements Neck, shoulders, hips: None, normal, Overall Severity Severity of abnormal movements (highest score from questions above): None, normal Incapacitation due to abnormal movements: None, normal Patient's awareness of abnormal movements (rate only patient's report): No Awareness, Dental Status Current problems with teeth and/or dentures?: No Does patient usually wear dentures?: No  CIWA:  CIWA-Ar Total: 0 COWS:  COWS Total Score: 1  Psychiatric Specialty Exam: See Psychiatric Specialty Exam and Suicide Risk Assessment completed by Attending Physician prior to discharge.  Discharge destination:  Other:  Home, then to [facility]  Is patient on multiple antipsychotic therapies at discharge:  No   Has Patient had three or more failed trials of antipsychotic monotherapy by history:  No  Recommended Plan for Multiple Antipsychotic Therapies: NA     Medication List    STOP taking these  medications       oxyCODONE-acetaminophen 5-325 MG per tablet  Commonly known as:  PERCOCET      TAKE these medications     Indication   albuterol 108 (90 BASE) MCG/ACT inhaler  Commonly known as:  PROVENTIL HFA;VENTOLIN HFA  Inhale 2 puffs into the lungs every 6 (six) hours as needed. Shortness of Breath/COPD   Indication:  Asthma, Chronic Obstructive Lung Disease     FLUoxetine 20 MG capsule  Commonly known as:  PROZAC  Take 1 capsule (20 mg total) by mouth daily. For depression   Indication:  Major Depressive Disorder     gabapentin 300 MG capsule  Commonly known as:  NEURONTIN  Take 1 capsule (300 mg total) by mouth 4 (four) times daily. For anxiety/pain control   Indication:  Agitation, Anxiety/pain control     metFORMIN 500 MG tablet  Commonly known as:  GLUCOPHAGE  Take 1 tablet (500 mg total) by mouth 2 (two) times daily. For diabetes management   Indication:  Type 2 Diabetes     QUEtiapine 50 MG tablet  Commonly known as:  SEROQUEL  Take 1 tablet (50 mg total) by mouth at bedtime. For mood control   Indication:  Mood control     traZODone 50 MG tablet  Commonly known as:  DESYREL  Take 1 tablet (50 mg total) by mouth at bedtime as needed for sleep (May repeat x1). For sleep   Indication:  Trouble Sleeping       Follow-up Information   Follow up with [facility]. (Arrive by 8AM with ID, 30 day medication supply, and clothing. They are aware you are homeless in [facility] currently even though your ID has [facility] address.  )    Contact information:   [address] [place], [place] [address] phone: [phone] fax: [phone]      Follow up with [facility] . (Walk in Monday through Friday between 8AM-9AM for hospital followup/medication management. )    Contact information:   [address], [place] [address] phone: [phone] fax: [phone]     Follow-up recommendations:  Activity:  As tolerated Diet: As recommended by  your primary care doctor. Keep all scheduled follow-up appointments as recommended.  Comments:  Take all your medications as prescribed by your mental healthcare provider. Report any adverse effects and or reactions from your medicines to your outpatient provider promptly. Patient is instructed and cautioned to not engage in alcohol and or illegal drug use while on prescription medicines. In the event of worsening symptoms, patient is instructed to call the crisis hotline, 911 and or go to the nearest ED for appropriate evaluation and treatment of symptoms. Follow-up with your primary care provider for your other medical issues, concerns and or health care needs.  Continue to work your relapse prevention plan Total Discharge Time:  Greater than 30 minutes.  Signed: Sanjuana Kava, PMHNP-BC, FNP-BC 07/28/2012, 3:28 PM Agree with assessment and plan Madie Reno A. Dub Mikes, M.D.

## 2012-07-28 NOTE — Progress Notes (Signed)
Patient ID: Julie Burnett, female   DOB: 05-22-1970, 42 y.o.   MRN: [id number]  D: Pt was observed being quiet and reserved in the dayroom. During the assessment pt informed the writer that she wasn't happy with her previous nurse. However, didn't give a clear explanation as to why. Pt informed the writer that she's planning to go to [facility] then go to [facility]. Stated she was hoping to go straight to [facility] because it's a 90 day program.  A:  Support and encouragement was offered. 15 min checks continued for safety.  R: Pt remains safe.

## 2012-07-28 NOTE — Progress Notes (Signed)
Psychoeducational Group Note  Date:  07/28/2012 Time:  1100  Group Topic/Focus: Overcoming Stress   Participation Level: Did Not Attend  Participation Quality:  Not Applicable  Affect:  Not Applicable  Cognitive:  Not Applicable  Insight:  Not Applicable  Engagement in Group: Not Applicable  Additional Comments:  Patient did not attend group, patient remained in bed.  Karleen Hampshire Brittini 07/28/2012, 6:45 PM

## 2012-07-28 NOTE — Progress Notes (Addendum)
Patient upset this morning, does not know where she can live until she can go to [facility] on Tuesday.  The only person she knows is her daughter and daughter's boyfriend who both drink.   Patient stated if she goes there, she will start drinking again.  Cannot stay sober until Tuesday.  Patient stated she refuses to go to shelter where she has to leave the shelter during the day.  D:  Patient's self inventory sheet, patient sleeps well, good appetite, low energy level, improving attention span.  Rated depression #2, hopelessness #5, anxiety #2.  Denied withdrawals.  SI off/on, contracts for safety.  Denied pain.  Plans to go to daughter's home after discharge. A:  Medications administered per MD orders.  Emotional support and encouragement given patient.[ R:  SI, contracts for safety.  Denied HI.  Denied A/V hallucinations.  Does have dreams while sleeping.  Denied pain. Went to morning groups.

## 2012-07-28 NOTE — [facility] Suicide Risk Assessment (Signed)
Suicide Risk Assessment  Discharge Assessment     Demographic Factors:  Caucasian  Mental Status Per Nursing Assessment::   On Admission:  Suicidal ideation indicated by patient  Current Mental Status by Physician: In full contact with reality. There are no suicidal ideas plans or intent. Her mood is worried, her affect is appropriate. She is willing and motivated to pursue outpatient treatment. She has an appointment to be admitted to [facility]  next week, She is still going to try to get into [facility].   Loss Factors: NA  Historical Factors: NA  Risk Reduction Factors:   Sense of responsibility to family and Living with another person, especially a relative  Continued Clinical Symptoms:  Depression:   Comorbid alcohol abuse/dependence Alcohol/Substance Abuse/Dependencies  Cognitive Features That Contribute To Risk:  Polarized thinking Thought constriction (tunnel vision)    Suicide Risk:  Minimal: No identifiable suicidal ideation.  Patients presenting with no risk factors but with morbid ruminations; may be classified as minimal risk based on the severity of the depressive symptoms  Discharge Diagnoses:   AXIS I:  Alcohol Dependence, Opioid Dependence, Depressive Disorder NOS AXIS II:  Deferred AXIS III:   Past Medical History  Diagnosis Date  . Anxiety   . Bipolar 1 disorder   . Depression   . Liver disease   . Diabetes mellitus   . High cholesterol    AXIS IV:  other psychosocial or environmental problems AXIS V:  61-70 mild symptoms  Plan Of Care/Follow-up recommendations:  Activity:  as tolerated Diet:  regular Follow up [facility] Is patient on multiple antipsychotic therapies at discharge:  No   Has Patient had three or more failed trials of antipsychotic monotherapy by history:  No  Recommended Plan for Multiple Antipsychotic Therapies:N/A   August Gosser A 07/28/2012, 11:19 AM

## 2012-07-28 NOTE — Progress Notes (Signed)
[facility] Adult Case Management Discharge Plan :  Will you be returning to the same living situation after discharge: No. At discharge, do you have transportation home?:Yes,  friend Do you have the ability to pay for your medications:Yes,  mental health  Release of information consent forms completed and in the chart;  Patient's signature needed at discharge.  Patient to Follow up at: Follow-up Information   Follow up with [facility]. (Arrive by 8AM with ID, 30 day medication supply, and clothing. They are aware you are homeless in [facility] currently even though your ID has [facility] address.  )    Contact information:   [address] [place], [place] [address] phone: [phone] fax: [phone]      Follow up with [facility] . (Walk in Monday through Friday between 8AM-9AM for hospital followup/medication management. )    Contact information:   [address] [place] [address] phone: [phone] fax: [phone]      Patient denies SI/HI:   Yes,  in group/self report    Safety Planning and Suicide Prevention discussed:  Yes,  pt refused to allow family contact for SPE. SPE completed with pt and pt given SPI pamphlet and encouraged to ask questions/voice concerns.  Smart, Albertine Lafoy 07/28/2012, 10:34 AM

## 2012-07-28 NOTE — [facility] Group Notes (Signed)
[facility] LCSW Aftercare Discharge Planning Group Note   07/28/2012 9:45 AM  Participation Quality:  Appropriate   Mood/Affect:  Irritable  Depression Rating:  1  Anxiety Rating:  0  Thoughts of Suicide:  No Will you contract for safety?   NA  Current AVH:  No  Plan for Discharge/Comments:  Pt stated that she will be staying with her daughter or her friend in [place] until [place] admission date. She plans to drink while at home and demonstrates no insight or motivation to remain sober after discharge. Pt still hoping to get into [facility] program and was given Maggie's  ([facility] contact) number/ext in order to check wait list weekly. Referral to [facility] sent Tues. Pt hoping to d/c by noon in order to have ride back to [facility]. PA notified.   Transportation Means: friend   Supports: very limited family and friend support  Counselling psychologist, Research scientist (physical sciences)

## 2012-08-02 NOTE — Progress Notes (Signed)
Patient Discharge Instructions:  After Visit Summary (AVS):   Faxed to:  08/02/12 Discharge Summary Note:   Faxed to:  08/02/12 Psychiatric Admission Assessment Note:   Faxed to:  08/02/12 Suicide Risk Assessment - Discharge Assessment:   Faxed to:  08/02/12 Faxed/Sent to the Next Level Care provider:  08/02/12 Faxed to [facility] @ [phone] Faxed to [facility] @ [phone]  Jerelene Redden, 08/02/2012, 3:23 PM

## 2012-08-08 DIAGNOSIS — Z8719 Personal history of other diseases of the digestive system: Secondary | ICD-10-CM | POA: Insufficient documentation

## 2012-08-08 DIAGNOSIS — Z8639 Personal history of other endocrine, nutritional and metabolic disease: Secondary | ICD-10-CM | POA: Insufficient documentation

## 2012-08-08 DIAGNOSIS — E119 Type 2 diabetes mellitus without complications: Secondary | ICD-10-CM | POA: Insufficient documentation

## 2012-08-08 DIAGNOSIS — Z862 Personal history of diseases of the blood and blood-forming organs and certain disorders involving the immune mechanism: Secondary | ICD-10-CM | POA: Insufficient documentation

## 2012-08-08 DIAGNOSIS — F17200 Nicotine dependence, unspecified, uncomplicated: Secondary | ICD-10-CM | POA: Insufficient documentation

## 2012-08-08 DIAGNOSIS — F411 Generalized anxiety disorder: Secondary | ICD-10-CM | POA: Insufficient documentation

## 2012-08-08 DIAGNOSIS — Z79899 Other long term (current) drug therapy: Secondary | ICD-10-CM | POA: Insufficient documentation

## 2012-08-09 ENCOUNTER — Encounter (HOSPITAL_COMMUNITY): Payer: Self-pay | Admitting: *Deleted

## 2012-08-09 ENCOUNTER — Emergency Department (HOSPITAL_COMMUNITY)
Admission: EM | Admit: 2012-08-09 | Discharge: 2012-08-09 | Disposition: A | Discharge status: Home / Self Care | Payer: Self-pay | Attending: Emergency Medicine | Admitting: Emergency Medicine

## 2012-08-09 DIAGNOSIS — F419 Anxiety disorder, unspecified: Secondary | ICD-10-CM

## 2012-08-09 MED ORDER — LORAZEPAM 1 MG PO TABS: 1.0000 mg | ORAL_TABLET | Freq: Once | ORAL | Status: DC

## 2012-08-09 NOTE — ED Notes (Signed)
Pt walked out of department.  Refused to sign paperwork or talk with staff.  Pt just stated "I'm going.  Goodbye."

## 2012-08-09 NOTE — ED Notes (Signed)
EDP in room advised pt that she missed her appointment & the bed that was waiting for her is not there now. EDP gave pt her options for tonight. Pt & family member arguing between themselves. Pt says she wants to go somewhere that she needs help other than [facility]. EDP advises we can check labs & get her medically cleared to follow up with [facility] in the morning.

## 2012-08-09 NOTE — ED Notes (Addendum)
Pt reporting suicidal thoughts.  States "I don't think my medications are working."  Pt reports being at [facility] about 2 weeks ago.  Was discharged approximately the 17th. Pt states that she did not complete any follow up that had been arranged. Pt denies specific plan, states "I just have several thoughts." Pt reports that she has not been taking her medications. Pt also reporting pain in her chest for "well over a week."   Pt later stated that she "was about to run out of medications. They didn't give me but 2 weeks worth."
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
## 2013-01-26 ENCOUNTER — Other Ambulatory Visit (HOSPITAL_COMMUNITY): Payer: Self-pay | Admitting: Family Medicine

## 2013-01-26 DIAGNOSIS — M545 Low back pain, unspecified: Secondary | ICD-10-CM

## 2013-01-31 ENCOUNTER — Ambulatory Visit (HOSPITAL_COMMUNITY)
Admission: RE | Admit: 2013-01-31 | Discharge: 2013-01-31 | Disposition: A | Discharge status: Home / Self Care | Payer: Self-pay | Source: Ambulatory Visit | Attending: Family Medicine | Admitting: Family Medicine

## 2013-01-31 DIAGNOSIS — M545 Low back pain, unspecified: Secondary | ICD-10-CM | POA: Insufficient documentation

## 2013-01-31 DIAGNOSIS — M51379 Other intervertebral disc degeneration, lumbosacral region without mention of lumbar back pain or lower extremity pain: Secondary | ICD-10-CM | POA: Insufficient documentation

## 2013-01-31 DIAGNOSIS — M47817 Spondylosis without myelopathy or radiculopathy, lumbosacral region: Secondary | ICD-10-CM | POA: Insufficient documentation

## 2013-01-31 DIAGNOSIS — M5126 Other intervertebral disc displacement, lumbar region: Secondary | ICD-10-CM | POA: Insufficient documentation

## 2013-01-31 DIAGNOSIS — M48061 Spinal stenosis, lumbar region without neurogenic claudication: Secondary | ICD-10-CM | POA: Insufficient documentation

## 2013-01-31 DIAGNOSIS — M5137 Other intervertebral disc degeneration, lumbosacral region: Secondary | ICD-10-CM | POA: Insufficient documentation

## 2013-01-31 DIAGNOSIS — M79609 Pain in unspecified limb: Secondary | ICD-10-CM | POA: Insufficient documentation

## 2013-11-09 ENCOUNTER — Other Ambulatory Visit (HOSPITAL_COMMUNITY): Payer: Self-pay | Admitting: Family Medicine

## 2013-11-09 DIAGNOSIS — R609 Edema, unspecified: Secondary | ICD-10-CM

## 2013-11-09 DIAGNOSIS — R200 Anesthesia of skin: Secondary | ICD-10-CM

## 2013-11-10 ENCOUNTER — Ambulatory Visit (HOSPITAL_COMMUNITY)
Admission: RE | Admit: 2013-11-10 | Discharge: 2013-11-10 | Disposition: A | Discharge status: Home / Self Care | Payer: Self-pay | Source: Ambulatory Visit | Attending: Interventional Radiology | Admitting: Interventional Radiology

## 2013-11-10 DIAGNOSIS — M79604 Pain in right leg: Secondary | ICD-10-CM | POA: Insufficient documentation

## 2013-11-10 DIAGNOSIS — R200 Anesthesia of skin: Secondary | ICD-10-CM | POA: Insufficient documentation

## 2013-11-10 DIAGNOSIS — R609 Edema, unspecified: Secondary | ICD-10-CM

## 2013-11-10 DIAGNOSIS — M7989 Other specified soft tissue disorders: Secondary | ICD-10-CM | POA: Insufficient documentation

## 2013-11-22 ENCOUNTER — Other Ambulatory Visit (HOSPITAL_COMMUNITY): Payer: Self-pay

## 2013-11-30 ENCOUNTER — Emergency Department (HOSPITAL_COMMUNITY): Payer: Self-pay

## 2013-11-30 ENCOUNTER — Encounter (HOSPITAL_COMMUNITY): Payer: Self-pay | Admitting: Emergency Medicine

## 2013-11-30 ENCOUNTER — Emergency Department (HOSPITAL_COMMUNITY)
Admission: EM | Admit: 2013-11-30 | Discharge: 2013-11-30 | Disposition: A | Discharge status: Home / Self Care | Payer: Self-pay | Attending: Emergency Medicine | Admitting: Emergency Medicine

## 2013-11-30 DIAGNOSIS — K088 Other specified disorders of teeth and supporting structures: Secondary | ICD-10-CM | POA: Insufficient documentation

## 2013-11-30 DIAGNOSIS — Z79899 Other long term (current) drug therapy: Secondary | ICD-10-CM | POA: Insufficient documentation

## 2013-11-30 DIAGNOSIS — Z8719 Personal history of other diseases of the digestive system: Secondary | ICD-10-CM | POA: Insufficient documentation

## 2013-11-30 DIAGNOSIS — R110 Nausea: Secondary | ICD-10-CM | POA: Insufficient documentation

## 2013-11-30 DIAGNOSIS — L03211 Cellulitis of face: Secondary | ICD-10-CM | POA: Insufficient documentation

## 2013-11-30 DIAGNOSIS — R51 Headache: Secondary | ICD-10-CM | POA: Insufficient documentation

## 2013-11-30 DIAGNOSIS — K029 Dental caries, unspecified: Secondary | ICD-10-CM | POA: Insufficient documentation

## 2013-11-30 DIAGNOSIS — F419 Anxiety disorder, unspecified: Secondary | ICD-10-CM | POA: Insufficient documentation

## 2013-11-30 DIAGNOSIS — Z720 Tobacco use: Secondary | ICD-10-CM | POA: Insufficient documentation

## 2013-11-30 DIAGNOSIS — R531 Weakness: Secondary | ICD-10-CM | POA: Insufficient documentation

## 2013-11-30 DIAGNOSIS — E119 Type 2 diabetes mellitus without complications: Secondary | ICD-10-CM | POA: Insufficient documentation

## 2013-11-30 DIAGNOSIS — F319 Bipolar disorder, unspecified: Secondary | ICD-10-CM | POA: Insufficient documentation

## 2013-11-30 DIAGNOSIS — M791 Myalgia: Secondary | ICD-10-CM | POA: Insufficient documentation

## 2013-11-30 DIAGNOSIS — J029 Acute pharyngitis, unspecified: Secondary | ICD-10-CM | POA: Insufficient documentation

## 2013-11-30 LAB — CBC WITH DIFFERENTIAL/PLATELET
BASOS ABS: 0 10*3/uL (ref 0.0–0.1)
Basophils Relative: 0 % (ref 0–1)
Eosinophils Absolute: 0 10*3/uL (ref 0.0–0.7)
Eosinophils Relative: 0 % (ref 0–5)
HEMATOCRIT: 41.5 % (ref 36.0–46.0)
HEMOGLOBIN: 14.1 g/dL (ref 12.0–15.0)
LYMPHS PCT: 27 % (ref 12–46)
Lymphs Abs: 3 10*3/uL (ref 0.7–4.0)
MCH: 31.3 pg (ref 26.0–34.0)
MCHC: 34 g/dL (ref 30.0–36.0)
MCV: 92.2 fL (ref 78.0–100.0)
MONO ABS: 0.7 10*3/uL (ref 0.1–1.0)
MONOS PCT: 7 % (ref 3–12)
NEUTROS ABS: 7 10*3/uL (ref 1.7–7.7)
NEUTROS PCT: 66 % (ref 43–77)
Platelets: 269 10*3/uL (ref 150–400)
RBC: 4.5 MIL/uL (ref 3.87–5.11)
RDW: 14.3 % (ref 11.5–15.5)
WBC: 10.8 10*3/uL — AB (ref 4.0–10.5)

## 2013-11-30 LAB — BASIC METABOLIC PANEL
ANION GAP: 15 (ref 5–15)
BUN: 8 mg/dL (ref 6–23)
CHLORIDE: 99 meq/L (ref 96–112)
CO2: 24 meq/L (ref 19–32)
CREATININE: 0.52 mg/dL (ref 0.50–1.10)
Calcium: 9.8 mg/dL (ref 8.4–10.5)
GFR calc Af Amer: >90 mL/min (ref >90)
GFR calc non Af Amer: >90 mL/min (ref >90)
Glucose, Bld: 134 mg/dL — ABNORMAL HIGH (ref 70–99)
POTASSIUM: 3.6 meq/L — AB (ref 3.7–5.3)
Sodium: 138 mEq/L (ref 137–147)

## 2013-11-30 MED ORDER — AMOXICILLIN 500 MG PO CAPS: 500.0000 mg | ORAL_CAPSULE | Freq: Three times a day (TID) | ORAL | Status: DC

## 2013-11-30 MED ORDER — OXYCODONE-ACETAMINOPHEN 5-325 MG PO TABS: 1.0000 | ORAL_TABLET | Freq: Four times a day (QID) | ORAL | Status: DC | PRN

## 2013-11-30 MED ORDER — IOHEXOL 300 MG/ML  SOLN
75.0000 mL | Freq: Once | INTRAMUSCULAR | Status: AC | PRN
  Administered 2013-11-30: 75 mL via INTRAVENOUS

## 2013-11-30 MED ORDER — NAPROXEN 500 MG PO TABS: 500.0000 mg | ORAL_TABLET | Freq: Two times a day (BID) | ORAL | Status: DC

## 2013-11-30 MED ORDER — HYDROMORPHONE HCL 1 MG/ML IJ SOLN
1.0000 mg | Freq: Once | INTRAMUSCULAR | Status: AC
Start: 2013-11-30 — End: 2013-11-30
  Administered 2013-11-30: 1 mg via INTRAVENOUS
  Filled 2013-11-30: qty 1

## 2013-11-30 MED ORDER — PROMETHAZINE HCL 25 MG/ML IJ SOLN
12.5000 mg | Freq: Once | INTRAMUSCULAR | Status: AC
  Administered 2013-11-30: 12.5 mg via INTRAVENOUS
  Filled 2013-11-30: qty 1

## 2013-11-30 MED ORDER — SODIUM CHLORIDE 0.9 % IV SOLN
INTRAVENOUS | Status: DC
  Administered 2013-11-30: 17:00:00 via INTRAVENOUS

## 2013-11-30 MED ORDER — OXYCODONE-ACETAMINOPHEN 5-325 MG PO TABS
1.0000 | ORAL_TABLET | Freq: Once | ORAL | Status: AC
  Administered 2013-11-30: 1 via ORAL
  Filled 2013-11-30: qty 1

## 2013-11-30 MED ORDER — AMPICILLIN-SULBACTAM SODIUM 3 (2-1) G IJ SOLR
3.0000 g | Freq: Once | INTRAMUSCULAR | Status: AC
  Administered 2013-11-30: 3 g via INTRAVENOUS
  Filled 2013-11-30: qty 3

## 2013-11-30 NOTE — ED Provider Notes (Signed)
CSN: [id number]     Arrival date & time 11/30/13  1458 History   First MD Initiated Contact with Patient 11/30/13 1611     Chief Complaint  Patient presents with  . Dental Pain     (Consider location/radiation/quality/duration/timing/severity/associated sxs/prior Treatment) Patient is a 43 y.o. female presenting with tooth pain. The history is provided by the patient.  Dental Pain Location:  Upper Upper teeth location:  3/RU 1st molar Associated symptoms: headaches   Associated symptoms: no fever    Julie Burnett is a 43 y.o. female who presents to the ED with facial swelling and dental pain that started 5 days ago. She states that each day the swelling and pain has gotten worse. She has had dental problems in the past but nothing like this. The pain is severe and she states she can hardly open her right eye. She states that it hurts to try and open her mouth.   Past Medical History  Diagnosis Date  . Anxiety   . Bipolar 1 disorder   . Depression   . Liver disease   . Diabetes mellitus   . High cholesterol    History reviewed. No pertinent past surgical history. History reviewed. No pertinent family history. History  Substance Use Topics  . Smoking status: Current Every Day Smoker -- 1.00 packs/day for 15 years    Types: Cigarettes  . Smokeless tobacco: Not on file  . Alcohol Use: 3.6 oz/week    6 Cans of beer per week     Comment: 6-12 pack daily; usually 12 oz   OB History    No data available     Review of Systems  Constitutional: Negative for fever and chills.  HENT: Positive for dental problem and sore throat. Negative for ear pain.   Eyes: Negative for visual disturbance.  Respiratory: Negative for chest tightness and shortness of breath. Cough: with smoking.   Cardiovascular: Negative for chest pain.  Gastrointestinal: Positive for nausea. Negative for vomiting and abdominal pain.  Genitourinary: Negative for dysuria, urgency and frequency.    Musculoskeletal: Positive for myalgias. Negative for back pain.  Skin: Negative for rash.  Neurological: Positive for weakness (all over body) and headaches. Negative for light-headedness.  Psychiatric/Behavioral: Negative for confusion. The patient is not nervous/anxious.       Allergies  Codeine  Home Medications   Prior to Admission medications   Medication Sig Start Date End Date Taking? Authorizing Provider  albuterol (PROVENTIL HFA;VENTOLIN HFA) 108 (90 BASE) MCG/ACT inhaler Inhale 2 puffs into the lungs every 6 (six) hours as needed. Shortness of Breath/COPD 07/28/12  Yes Sanjuana KavaAgnes I Nwoko, NP  gabapentin (NEURONTIN) 300 MG capsule Take 1 capsule (300 mg total) by mouth 4 (four) times daily. For anxiety/pain control Patient taking differently: Take 600 mg by mouth at bedtime. For anxiety/pain control 07/28/12  Yes Sanjuana KavaAgnes I Nwoko, NP  ibuprofen (ADVIL,MOTRIN) 200 MG tablet Take 200 mg by mouth every 6 (six) hours as needed for mild pain or moderate pain.   Yes Historical Provider, MD  metFORMIN (GLUCOPHAGE) 500 MG tablet Take 1 tablet (500 mg total) by mouth 2 (two) times daily. For diabetes management Patient taking differently: Take 500 mg by mouth at bedtime. For diabetes management 07/28/12  Yes Sanjuana KavaAgnes I Nwoko, NP  amoxicillin (AMOXIL) 500 MG capsule Take 1 capsule (500 mg total) by mouth 3 (three) times daily. 11/30/13   Hope Orlene OchM Neese, NP  naproxen (NAPROSYN) 500 MG tablet Take 1 tablet (500 mg  total) by mouth 2 (two) times daily. 11/30/13   Hope Orlene OchM Neese, NP  oxyCODONE-acetaminophen (ROXICET) 5-325 MG per tablet Take 1 tablet by mouth every 6 (six) hours as needed for severe pain. 11/30/13   Hope Orlene OchM Neese, NP   BP 138/90 mmHg  Pulse 108  Temp(Src) 99 F (37.2 C) (Oral)  Resp 18  Ht 5\' 5"  (1.651 m)  Wt 185 lb (83.915 kg)  BMI 30.79 kg/m2  SpO2 100%  LMP 10/12/2013 Physical Exam  Constitutional: She is oriented to person, place, and time. She appears well-developed and  well-nourished. No distress.  HENT:  Head: Normocephalic.    Swelling of the right side of the face with erythema noted to the cheek around the right eye. Tender on palpation.   Eyes: Conjunctivae and EOM are normal.  Neck: Normal range of motion. Neck supple.  Cardiovascular: Normal rate.   Pulmonary/Chest: Effort normal.  Abdominal: Soft. There is no tenderness.  Musculoskeletal: Normal range of motion.  Neurological: She is alert and oriented to person, place, and time. No cranial nerve deficit.  Skin: Skin is warm and dry.  Psychiatric: She has a normal mood and affect. Her behavior is normal.  Nursing note and vitals reviewed.   ED Course  Procedures (including critical care time) Labs Review Results for orders placed or performed during the hospital encounter of 11/30/13 (from the past 24 hour(s))  CBC with Differential     Status: Abnormal   Collection Time: 11/30/13  5:03 PM  Result Value Ref Range   WBC 10.8 (H) 4.0 - 10.5 K/uL   RBC 4.50 3.87 - 5.11 MIL/uL   Hemoglobin 14.1 12.0 - 15.0 g/dL   HCT 56.241.5 13.036.0 - 86.546.0 %   MCV 92.2 78.0 - 100.0 fL   MCH 31.3 26.0 - 34.0 pg   MCHC 34.0 30.0 - 36.0 g/dL   RDW 78.414.3 69.611.5 - 29.515.5 %   Platelets 269 150 - 400 K/uL   Neutrophils Relative % 66 43 - 77 %   Neutro Abs 7.0 1.7 - 7.7 K/uL   Lymphocytes Relative 27 12 - 46 %   Lymphs Abs 3.0 0.7 - 4.0 K/uL   Monocytes Relative 7 3 - 12 %   Monocytes Absolute 0.7 0.1 - 1.0 K/uL   Eosinophils Relative 0 0 - 5 %   Eosinophils Absolute 0.0 0.0 - 0.7 K/uL   Basophils Relative 0 0 - 1 %   Basophils Absolute 0.0 0.0 - 0.1 K/uL  Basic metabolic panel     Status: Abnormal   Collection Time: 11/30/13  5:03 PM  Result Value Ref Range   Sodium 138 137 - 147 mEq/L   Potassium 3.6 (L) 3.7 - 5.3 mEq/L   Chloride 99 96 - 112 mEq/L   CO2 24 19 - 32 mEq/L   Glucose, Bld 134 (H) 70 - 99 mg/dL   BUN 8 6 - 23 mg/dL   Creatinine, Ser 2.840.52 0.50 - 1.10 mg/dL   Calcium 9.8 8.4 - 13.210.5 mg/dL   GFR  calc non Af Amer >90 >90 mL/min   GFR calc Af Amer >90 >90 mL/min   Anion gap 15 5 - 15    Dr. Deretha EmoryZackowski in to examine the patient.  MDM  Ct Maxillofacial W/cm  11/30/2013   CLINICAL DATA:  Dental pain since Sunday is right-sided facial pain. The pain extends under the right eye.  EXAM: CT MAXILLOFACIAL WITH CONTRAST  TECHNIQUE: Multidetector CT imaging of the maxillofacial structures was  performed with intravenous contrast. Multiplanar CT image reconstructions were also generated. A small metallic BB was placed on the right temple in order to reliably differentiate right from left.  CONTRAST:  75mL OMNIPAQUE IOHEXOL 300 MG/ML  SOLN  COMPARISON:  None.  FINDINGS: There is right maxillary sinus mucosal thickening. There is periapical lucency of the maxillary and mandibular left central incisor. There are dental caries involving the first and second right premolars and the first right mandibular molar. There is a dental carie involving the left mandibular first premolar.  There is mild soft tissue edema within the subcutaneous fat of the right cheek. There is soft tissue thickening and enhancement of the right infraorbital soft tissues most concerning for preseptal cellulitis. There is no focal fluid collection to suggest an abscess.  The globes are intact. The orbital walls are intact. The orbital floors are intact. The maxilla is intact. The mandible is intact. The zygomatic arches are intact. The nasal septum is midline. There is no nasal bone fracture. The temporomandibular joints are normal.  The visualized portions of the mastoid sinuses are well aerated.  IMPRESSION: 1. Right orbital preseptal cellulitis. No drainable fluid collection. Mild soft tissue edema in the subcutaneous fat of the right cheek. 2. Dental disease as described above. Recommend dental consultation.   Electronically Signed   By: Elige Ko   On: 11/30/2013 18:55    43 y.o. female with facial swelling, redness, pain and dental  pain. Treated with Unasyn 3 grams IV while in the ED. Stable for d/c home with antibiotics and pain medication. She is to see a dentist as soon as possible. She will return here as needed for worsening symptoms.   Final diagnoses:  Facial cellulitis  Pain due to dental caries       Janne Napoleon, NP 11/30/13 1929

## 2013-11-30 NOTE — ED Provider Notes (Signed)
Medical screening examination/treatment/procedure(s) were conducted as a shared visit with non-physician practitioner(s) and myself.  I personally evaluated the patient during the encounter.   EKG Interpretation None      Patient seen by me. Patient with tooth pain right upper tooth. Been present with the pain since Sunday. Now has visible swelling to the right cheek in the lower part of the right eyelid. No pain with range of motion of the eye. Slight erythema to the skin on the face. No trouble talking. Oral pharynx is clear. Several teeth of questionable health and/or poor dentition. Patient will receive some IV antibiotics were waiting for CT scan of the face with contrast. But suspect this is probably related to an apical tooth abscess. If no complicating factors patient be discharged home on antibiotics pain control and follow-up with a dentist.  Vanetta MuldersScott Reverie Vaquera, MD 11/30/13 1730

## 2013-11-30 NOTE — ED Notes (Signed)
Pt back in saying that pharmacy would not fill Roxicet prescription because it was too close to her last refill of same drug. Per pt, pharmacist told her that if she came back up here and had Mayer CamelH. Neese, NP write on script that it was okay to dispense today that they would do it. Since pt's pharmacy now closed, pt was given a Roxicet pre pack to take home and the original script was shredded per Mayer CamelH Neese, NP.

## 2013-11-30 NOTE — ED Notes (Signed)
Pt with dental pain since Sunday. Pt has visible swelling to rt side of face and extends under rt eye.

## 2013-11-30 NOTE — Discharge Instructions (Signed)
It is important that you see a dentist as soon as possible to have the tooth that is hurting removed. Continue to take the antibiotics and the medications for pain as directed. Return as needed for worsening symptoms.

## 2013-12-05 MED FILL — Oxycodone w/ Acetaminophen Tab 5-325 MG: ORAL | Qty: 6 | Status: AC

## 2015-03-05 ENCOUNTER — Emergency Department (HOSPITAL_COMMUNITY)
Admission: EM | Admit: 2015-03-05 | Discharge: 2015-03-05 | Disposition: A | Discharge status: Other Acute Inpatient Hospital | Payer: Self-pay | Attending: Emergency Medicine | Admitting: Emergency Medicine

## 2015-03-05 ENCOUNTER — Encounter (HOSPITAL_COMMUNITY): Payer: Self-pay | Admitting: *Deleted

## 2015-03-05 ENCOUNTER — Inpatient Hospital Stay (HOSPITAL_COMMUNITY)
Admission: EM | Admit: 2015-03-05 | Discharge: 2015-03-09 | DRG: 881 | Disposition: A | Discharge status: Home / Self Care | Payer: No Typology Code available for payment source | Source: Intra-hospital | Attending: Psychiatry | Admitting: Psychiatry

## 2015-03-05 ENCOUNTER — Emergency Department (HOSPITAL_COMMUNITY): Payer: Self-pay

## 2015-03-05 ENCOUNTER — Encounter (HOSPITAL_COMMUNITY): Payer: Self-pay

## 2015-03-05 DIAGNOSIS — E119 Type 2 diabetes mellitus without complications: Secondary | ICD-10-CM | POA: Diagnosis present

## 2015-03-05 DIAGNOSIS — F17210 Nicotine dependence, cigarettes, uncomplicated: Secondary | ICD-10-CM | POA: Diagnosis present

## 2015-03-05 DIAGNOSIS — F319 Bipolar disorder, unspecified: Secondary | ICD-10-CM | POA: Insufficient documentation

## 2015-03-05 DIAGNOSIS — Z792 Long term (current) use of antibiotics: Secondary | ICD-10-CM | POA: Insufficient documentation

## 2015-03-05 DIAGNOSIS — Z7984 Long term (current) use of oral hypoglycemic drugs: Secondary | ICD-10-CM | POA: Insufficient documentation

## 2015-03-05 DIAGNOSIS — T424X2A Poisoning by benzodiazepines, intentional self-harm, initial encounter: Secondary | ICD-10-CM | POA: Insufficient documentation

## 2015-03-05 DIAGNOSIS — F332 Major depressive disorder, recurrent severe without psychotic features: Secondary | ICD-10-CM | POA: Diagnosis not present

## 2015-03-05 DIAGNOSIS — F1020 Alcohol dependence, uncomplicated: Secondary | ICD-10-CM | POA: Diagnosis present

## 2015-03-05 DIAGNOSIS — F329 Major depressive disorder, single episode, unspecified: Secondary | ICD-10-CM | POA: Diagnosis present

## 2015-03-05 DIAGNOSIS — F419 Anxiety disorder, unspecified: Secondary | ICD-10-CM | POA: Insufficient documentation

## 2015-03-05 DIAGNOSIS — Z791 Long term (current) use of non-steroidal anti-inflammatories (NSAID): Secondary | ICD-10-CM | POA: Insufficient documentation

## 2015-03-05 DIAGNOSIS — Y9289 Other specified places as the place of occurrence of the external cause: Secondary | ICD-10-CM | POA: Insufficient documentation

## 2015-03-05 DIAGNOSIS — X58XXXA Exposure to other specified factors, initial encounter: Secondary | ICD-10-CM | POA: Insufficient documentation

## 2015-03-05 DIAGNOSIS — Z915 Personal history of self-harm: Secondary | ICD-10-CM

## 2015-03-05 DIAGNOSIS — Z9119 Patient's noncompliance with other medical treatment and regimen: Secondary | ICD-10-CM | POA: Diagnosis not present

## 2015-03-05 DIAGNOSIS — Z8719 Personal history of other diseases of the digestive system: Secondary | ICD-10-CM | POA: Insufficient documentation

## 2015-03-05 DIAGNOSIS — Y9389 Activity, other specified: Secondary | ICD-10-CM | POA: Insufficient documentation

## 2015-03-05 DIAGNOSIS — F919 Conduct disorder, unspecified: Secondary | ICD-10-CM | POA: Insufficient documentation

## 2015-03-05 DIAGNOSIS — Y998 Other external cause status: Secondary | ICD-10-CM | POA: Insufficient documentation

## 2015-03-05 DIAGNOSIS — Z79899 Other long term (current) drug therapy: Secondary | ICD-10-CM | POA: Insufficient documentation

## 2015-03-05 DIAGNOSIS — F10230 Alcohol dependence with withdrawal, uncomplicated: Secondary | ICD-10-CM | POA: Diagnosis not present

## 2015-03-05 DIAGNOSIS — F1210 Cannabis abuse, uncomplicated: Secondary | ICD-10-CM | POA: Insufficient documentation

## 2015-03-05 DIAGNOSIS — T383X2A Poisoning by insulin and oral hypoglycemic [antidiabetic] drugs, intentional self-harm, initial encounter: Secondary | ICD-10-CM | POA: Insufficient documentation

## 2015-03-05 DIAGNOSIS — F1024 Alcohol dependence with alcohol-induced mood disorder: Secondary | ICD-10-CM | POA: Diagnosis not present

## 2015-03-05 DIAGNOSIS — T50902A Poisoning by unspecified drugs, medicaments and biological substances, intentional self-harm, initial encounter: Secondary | ICD-10-CM

## 2015-03-05 LAB — URINALYSIS, ROUTINE W REFLEX MICROSCOPIC
BILIRUBIN URINE: NEGATIVE
Glucose, UA: NEGATIVE mg/dL
Ketones, ur: NEGATIVE mg/dL
Leukocytes, UA: NEGATIVE
Nitrite: NEGATIVE
PH: 5.5 (ref 5.0–8.0)
Protein, ur: NEGATIVE mg/dL
SPECIFIC GRAVITY, URINE: 1.025 (ref 1.005–1.030)

## 2015-03-05 LAB — RAPID URINE DRUG SCREEN, HOSP PERFORMED
Amphetamines: NOT DETECTED
Barbiturates: NOT DETECTED
Benzodiazepines: POSITIVE — AB
Cocaine: NOT DETECTED
Opiates: NOT DETECTED
Tetrahydrocannabinol: POSITIVE — AB

## 2015-03-05 LAB — I-STAT CHEM 8, ED
BUN: 3 mg/dL — ABNORMAL LOW (ref 6–20)
Calcium, Ion: 1.04 mmol/L — ABNORMAL LOW (ref 1.12–1.23)
Chloride: 108 mmol/L (ref 101–111)
Creatinine, Ser: 0.5 mg/dL (ref 0.44–1.00)
Glucose, Bld: 113 mg/dL — ABNORMAL HIGH (ref 65–99)
HCT: 40 % (ref 36.0–46.0)
Hemoglobin: 13.6 g/dL (ref 12.0–15.0)
Potassium: 3.9 mmol/L (ref 3.5–5.1)
Sodium: 144 mmol/L (ref 135–145)
TCO2: 18 mmol/L (ref 0–100)

## 2015-03-05 LAB — CBC WITH DIFFERENTIAL/PLATELET
BASOS ABS: 0 10*3/uL (ref 0.0–0.1)
Basophils Relative: 0 %
EOS PCT: 1 %
Eosinophils Absolute: 0.1 10*3/uL (ref 0.0–0.7)
HCT: 40.8 % (ref 36.0–46.0)
HEMOGLOBIN: 13.8 g/dL (ref 12.0–15.0)
LYMPHS PCT: 47 %
Lymphs Abs: 3.7 10*3/uL (ref 0.7–4.0)
MCH: 30.4 pg (ref 26.0–34.0)
MCHC: 33.8 g/dL (ref 30.0–36.0)
MCV: 89.9 fL (ref 78.0–100.0)
Monocytes Absolute: 0.6 10*3/uL (ref 0.1–1.0)
Monocytes Relative: 8 %
NEUTROS ABS: 3.4 10*3/uL (ref 1.7–7.7)
NEUTROS PCT: 44 %
PLATELETS: 293 10*3/uL (ref 150–400)
RBC: 4.54 MIL/uL (ref 3.87–5.11)
RDW: 14.5 % (ref 11.5–15.5)
WBC: 7.8 10*3/uL (ref 4.0–10.5)

## 2015-03-05 LAB — ETHANOL: Alcohol, Ethyl (B): 197 mg/dL — ABNORMAL HIGH

## 2015-03-05 LAB — COMPREHENSIVE METABOLIC PANEL
ALBUMIN: 4.2 g/dL (ref 3.5–5.0)
ALT: 20 U/L (ref 14–54)
ANION GAP: 13 (ref 5–15)
AST: 26 U/L (ref 15–41)
Alkaline Phosphatase: 96 U/L (ref 38–126)
BUN: 6 mg/dL (ref 6–20)
CHLORIDE: 104 mmol/L (ref 101–111)
CO2: 23 mmol/L (ref 22–32)
Calcium: 8.9 mg/dL (ref 8.9–10.3)
Creatinine, Ser: 0.71 mg/dL (ref 0.44–1.00)
GFR calc Af Amer: >60 mL/min (ref >60)
GFR calc non Af Amer: >60 mL/min (ref >60)
GLUCOSE: 140 mg/dL — AB (ref 65–99)
POTASSIUM: 3.7 mmol/L (ref 3.5–5.1)
SODIUM: 140 mmol/L (ref 135–145)
TOTAL PROTEIN: 7.6 g/dL (ref 6.5–8.1)
Total Bilirubin: 0.3 mg/dL (ref 0.3–1.2)

## 2015-03-05 LAB — BASIC METABOLIC PANEL
Anion gap: 7 (ref 5–15)
BUN: 7 mg/dL (ref 6–20)
CHLORIDE: 111 mmol/L (ref 101–111)
CO2: 22 mmol/L (ref 22–32)
Calcium: 7.4 mg/dL — ABNORMAL LOW (ref 8.9–10.3)
Creatinine, Ser: 0.59 mg/dL (ref 0.44–1.00)
GFR calc Af Amer: >60 mL/min (ref >60)
GFR calc non Af Amer: >60 mL/min (ref >60)
GLUCOSE: 101 mg/dL — AB (ref 65–99)
POTASSIUM: 4.2 mmol/L (ref 3.5–5.1)
Sodium: 140 mmol/L (ref 135–145)

## 2015-03-05 LAB — GLUCOSE, CAPILLARY: Glucose-Capillary: 136 mg/dL — ABNORMAL HIGH (ref 65–99)

## 2015-03-05 LAB — CBG MONITORING, ED
GLUCOSE-CAPILLARY: 106 mg/dL — AB (ref 65–99)
Glucose-Capillary: 106 mg/dL — ABNORMAL HIGH (ref 65–99)
Glucose-Capillary: 117 mg/dL — ABNORMAL HIGH (ref 65–99)
Glucose-Capillary: 121 mg/dL — ABNORMAL HIGH (ref 65–99)
Glucose-Capillary: 126 mg/dL — ABNORMAL HIGH (ref 65–99)
Glucose-Capillary: 149 mg/dL — ABNORMAL HIGH (ref 65–99)

## 2015-03-05 LAB — URINE MICROSCOPIC-ADD ON
BACTERIA UA: NONE SEEN
Squamous Epithelial / LPF: NONE SEEN
WBC, UA: NONE SEEN WBC/hpf (ref 0–5)

## 2015-03-05 LAB — SALICYLATE LEVEL

## 2015-03-05 LAB — LACTIC ACID, PLASMA
LACTIC ACID, VENOUS: 3.7 mmol/L — AB (ref 0.5–2.0)
Lactic Acid, Venous: 1.4 mmol/L (ref 0.5–2.0)

## 2015-03-05 LAB — ACETAMINOPHEN LEVEL: Acetaminophen (Tylenol), Serum: <10 ug/mL — ABNORMAL LOW (ref 10–30)

## 2015-03-05 MED ORDER — SODIUM CHLORIDE 0.9 % IV BOLUS (SEPSIS)
1000.0000 mL | Freq: Once | INTRAVENOUS | Status: AC
  Administered 2015-03-05: 1000 mL via INTRAVENOUS

## 2015-03-05 MED ORDER — IBUPROFEN 400 MG PO TABS: 600.0000 mg | ORAL_TABLET | Freq: Three times a day (TID) | ORAL | Status: DC | PRN

## 2015-03-05 MED ORDER — IBUPROFEN 600 MG PO TABS
600.0000 mg | ORAL_TABLET | Freq: Four times a day (QID) | ORAL | Status: DC | PRN
  Administered 2015-03-05 – 2015-03-09 (×7): 600 mg via ORAL
  Filled 2015-03-05 (×8): qty 1

## 2015-03-05 MED ORDER — ONDANSETRON HCL 4 MG PO TABS
4.0000 mg | ORAL_TABLET | Freq: Three times a day (TID) | ORAL | Status: DC | PRN
  Administered 2015-03-05: 4 mg via ORAL
  Filled 2015-03-05: qty 1

## 2015-03-05 MED ORDER — MAGNESIUM HYDROXIDE 400 MG/5ML PO SUSP: 30.0000 mL | Freq: Every day | ORAL | Status: DC | PRN

## 2015-03-05 MED ORDER — ALUM & MAG HYDROXIDE-SIMETH 200-200-20 MG/5ML PO SUSP: 30.0000 mL | ORAL | Status: DC | PRN

## 2015-03-05 MED ORDER — ACETAMINOPHEN 325 MG PO TABS
650.0000 mg | ORAL_TABLET | Freq: Four times a day (QID) | ORAL | Status: DC | PRN
  Administered 2015-03-05 – 2015-03-09 (×7): 650 mg via ORAL
  Filled 2015-03-05 (×8): qty 2

## 2015-03-05 MED ORDER — ZOLPIDEM TARTRATE 5 MG PO TABS: 10.0000 mg | ORAL_TABLET | Freq: Every evening | ORAL | Status: DC | PRN

## 2015-03-05 MED ORDER — ACETAMINOPHEN 325 MG PO TABS
650.0000 mg | ORAL_TABLET | ORAL | Status: DC | PRN
  Administered 2015-03-05: 650 mg via ORAL
  Filled 2015-03-05: qty 2

## 2015-03-05 MED ORDER — NICOTINE 21 MG/24HR TD PT24
21.0000 mg | MEDICATED_PATCH | Freq: Every day | TRANSDERMAL | Status: DC
  Administered 2015-03-05 – 2015-03-09 (×5): 21 mg via TRANSDERMAL
  Filled 2015-03-05 (×7): qty 1

## 2015-03-05 MED ORDER — NICOTINE 21 MG/24HR TD PT24: 21.0000 mg | MEDICATED_PATCH | Freq: Every day | TRANSDERMAL | Status: DC

## 2015-03-05 MED ORDER — METFORMIN HCL 500 MG PO TABS
500.0000 mg | ORAL_TABLET | Freq: Two times a day (BID) | ORAL | Status: DC
  Administered 2015-03-05 – 2015-03-09 (×8): 500 mg via ORAL
  Filled 2015-03-05: qty 1
  Filled 2015-03-05: qty 14
  Filled 2015-03-05: qty 1
  Filled 2015-03-05: qty 14
  Filled 2015-03-05 (×3): qty 1
  Filled 2015-03-05 (×2): qty 14
  Filled 2015-03-05 (×7): qty 1

## 2015-03-05 MED ORDER — SODIUM CHLORIDE 0.9 % IV SOLN
1000.0000 mL | Freq: Once | INTRAVENOUS | Status: AC
  Administered 2015-03-05: 1000 mL via INTRAVENOUS

## 2015-03-05 MED ORDER — SODIUM CHLORIDE 0.9 % IV SOLN
INTRAVENOUS | Status: DC
Start: 2015-03-05 — End: 2015-03-05

## 2015-03-05 MED ORDER — OXYCODONE-ACETAMINOPHEN 5-325 MG PO TABS
2.0000 | ORAL_TABLET | Freq: Once | ORAL | Status: AC
Start: 2015-03-05 — End: 2015-03-05
  Administered 2015-03-05: 2 via ORAL
  Filled 2015-03-05: qty 2

## 2015-03-05 MED ORDER — TRAZODONE HCL 50 MG PO TABS
50.0000 mg | ORAL_TABLET | Freq: Every evening | ORAL | Status: DC | PRN
  Administered 2015-03-05 – 2015-03-07 (×2): 50 mg via ORAL
  Filled 2015-03-05 (×2): qty 1
  Filled 2015-03-05: qty 7
  Filled 2015-03-05: qty 1

## 2015-03-05 NOTE — ED Notes (Signed)
Pt crying complaining of back and neck pain, MD called to bedside., orders given

## 2015-03-05 NOTE — Progress Notes (Signed)
Recreation Therapy Notes  Animal-Assisted Activity (AAA) Program Checklist/Progress Notes Patient Eligibility Criteria Checklist & Daily Group note for Rec Tx Intervention  Date: 02.21.2017 Time: 2:45pm Location: [address]   AAA/T Program Assumption of Risk Form signed by Patient/ or Parent Legal Guardian yes  Patient is free of allergies or sever asthma yes  Patient reports no fear of animals yes  Patient reports no history of cruelty to animals yes  Patient understands his/her participation is voluntary yes  Behavioral Response: Did not attend.   Julie Burnett, LRT/CTRS  Julie Burnett L 03/05/2015 3:17 PM
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
## 2015-03-05 NOTE — ED Notes (Signed)
Lab work to be drawn after bolus finished, will notify lab.

## 2015-03-05 NOTE — Progress Notes (Signed)
Psychoeducational Group Note  Date:  03/05/2015 Time: 2100 Group Topic/Focus:  wrap up group  Participation Level: Did Not Attend  Participation Quality:  Not Applicable  Affect:  Not Applicable  Cognitive:  Not Applicable  Insight:  Not Applicable  Engagement in Group: Not Applicable  Additional Comments:  Pt was notified that group was beginning. Pt was newly admitted and remained sleeping in bed  Shelah Lewandowsky 03/05/2015, 10:06 PM

## 2015-03-05 NOTE — ED Notes (Signed)
Called [facility] for transport to [facility].  Driver will come from [place].

## 2015-03-05 NOTE — ED Notes (Signed)
Lactic acid already drawn by lab.

## 2015-03-05 NOTE — ED Provider Notes (Signed)
Results for orders placed or performed during the hospital encounter of 03/05/15  Comprehensive metabolic panel  Result Value Ref Range   Sodium 140 135 - 145 mmol/L   Potassium 3.7 3.5 - 5.1 mmol/L   Chloride 104 101 - 111 mmol/L   CO2 23 22 - 32 mmol/L   Glucose, Bld 140 (H) 65 - 99 mg/dL   BUN 6 6 - 20 mg/dL   Creatinine, Ser 1.61 0.44 - 1.00 mg/dL   Calcium 8.9 8.9 - 09.6 mg/dL   Total Protein 7.6 6.5 - 8.1 g/dL   Albumin 4.2 3.5 - 5.0 g/dL   AST 26 15 - 41 U/L   ALT 20 14 - 54 U/L   Alkaline Phosphatase 96 38 - 126 U/L   Total Bilirubin 0.3 0.3 - 1.2 mg/dL   GFR calc non Af Amer >60 >60 mL/min   GFR calc Af Amer >60 >60 mL/min   Anion gap 13 5 - 15  Ethanol  Result Value Ref Range   Alcohol, Ethyl (B) 197 (H) <5 mg/dL  Acetaminophen level  Result Value Ref Range   Acetaminophen (Tylenol), Serum <10 (L) 10 - 30 ug/mL  Salicylate level  Result Value Ref Range   Salicylate Lvl <4.0 2.8 - 30.0 mg/dL  CBC with Differential  Result Value Ref Range   WBC 7.8 4.0 - 10.5 K/uL   RBC 4.54 3.87 - 5.11 MIL/uL   Hemoglobin 13.8 12.0 - 15.0 g/dL   HCT 04.5 40.9 - 81.1 %   MCV 89.9 78.0 - 100.0 fL   MCH 30.4 26.0 - 34.0 pg   MCHC 33.8 30.0 - 36.0 g/dL   RDW 91.4 78.2 - 95.6 %   Platelets 293 150 - 400 K/uL   Neutrophils Relative % 44 %   Neutro Abs 3.4 1.7 - 7.7 K/uL   Lymphocytes Relative 47 %   Lymphs Abs 3.7 0.7 - 4.0 K/uL   Monocytes Relative 8 %   Monocytes Absolute 0.6 0.1 - 1.0 K/uL   Eosinophils Relative 1 %   Eosinophils Absolute 0.1 0.0 - 0.7 K/uL   Basophils Relative 0 %   Basophils Absolute 0.0 0.0 - 0.1 K/uL  Urine rapid drug screen (hosp performed)  Result Value Ref Range   Opiates NONE DETECTED NONE DETECTED   Cocaine NONE DETECTED NONE DETECTED   Benzodiazepines POSITIVE (A) NONE DETECTED   Amphetamines NONE DETECTED NONE DETECTED   Tetrahydrocannabinol POSITIVE (A) NONE DETECTED   Barbiturates NONE DETECTED NONE DETECTED  Lactic acid, plasma  Result  Value Ref Range   Lactic Acid, Venous 3.7 (HH) 0.5 - 2.0 mmol/L  Urinalysis, Routine w reflex microscopic (not at [facility])  Result Value Ref Range   Color, Urine YELLOW YELLOW   APPearance CLEAR CLEAR   Specific Gravity, Urine 1.025 1.005 - 1.030   pH 5.5 5.0 - 8.0   Glucose, UA NEGATIVE NEGATIVE mg/dL   Hgb urine dipstick TRACE (A) NEGATIVE   Bilirubin Urine NEGATIVE NEGATIVE   Ketones, ur NEGATIVE NEGATIVE mg/dL   Protein, ur NEGATIVE NEGATIVE mg/dL   Nitrite NEGATIVE NEGATIVE   Leukocytes, UA NEGATIVE NEGATIVE  Basic metabolic panel  Result Value Ref Range   Sodium 140 135 - 145 mmol/L   Potassium 4.2 3.5 - 5.1 mmol/L   Chloride 111 101 - 111 mmol/L   CO2 22 22 - 32 mmol/L   Glucose, Bld 101 (H) 65 - 99 mg/dL   BUN 7 6 - 20 mg/dL   Creatinine, Ser  0.59 0.44 - 1.00 mg/dL   Calcium 7.4 (L) 8.9 - 10.3 mg/dL   GFR calc non Af Amer >60 >60 mL/min   GFR calc Af Amer >60 >60 mL/min   Anion gap 7 5 - 15  Lactic acid, plasma  Result Value Ref Range   Lactic Acid, Venous 1.4 0.5 - 2.0 mmol/L  Urine microscopic-add on  Result Value Ref Range   Squamous Epithelial / LPF NONE SEEN NONE SEEN   WBC, UA NONE SEEN 0 - 5 WBC/hpf   RBC / HPF 0-5 0 - 5 RBC/hpf   Bacteria, UA NONE SEEN NONE SEEN  CBG monitoring, ED  Result Value Ref Range   Glucose-Capillary 126 (H) 65 - 99 mg/dL  CBG monitoring, ED  Result Value Ref Range   Glucose-Capillary 149 (H) 65 - 99 mg/dL  CBG monitoring, ED  Result Value Ref Range   Glucose-Capillary 117 (H) 65 - 99 mg/dL  CBG monitoring, ED  Result Value Ref Range   Glucose-Capillary 106 (H) 65 - 99 mg/dL  CBG monitoring, ED  Result Value Ref Range   Glucose-Capillary 121 (H) 65 - 99 mg/dL  I-stat Chem 8, ED  Result Value Ref Range   Sodium 144 135 - 145 mmol/L   Potassium 3.9 3.5 - 5.1 mmol/L   Chloride 108 101 - 111 mmol/L   BUN 3 (L) 6 - 20 mg/dL   Creatinine, Ser 5.40 0.44 - 1.00 mg/dL   Glucose, Bld 981 (H) 65 - 99 mg/dL   Calcium, Ion 1.91 (L)  1.12 - 1.23 mmol/L   TCO2 18 0 - 100 mmol/L   Hemoglobin 13.6 12.0 - 15.0 g/dL   HCT 47.8 29.5 - 62.1 %  POC CBG, ED  Result Value Ref Range   Glucose-Capillary 106 (H) 65 - 99 mg/dL   Dg Chest 2 View  03/19/6576  CLINICAL DATA:  45 year old female status post overdose. Initial encounter. EXAM: CHEST  2 VIEW COMPARISON:  04/27/2012 and earlier. FINDINGS: Normal lung volumes. Normal cardiac size and mediastinal contours. Visualized tracheal air column is within normal limits. The lungs are clear. No pneumothorax or pleural effusion. Mild dextro convex thoracic scoliosis, no other osseous abnormality. IMPRESSION: No acute cardiopulmonary abnormality. Electronically Signed   By: Odessa Fleming M.D.   On: 03/05/2015 08:46    Patient had been accepted to behavioral health this morning however she had an elevated lactic acid with an anion gap. Patient has received IV fluid hydration likely acid is now resolved. Chest x-rays negative urinalysis negative for urinary tract infection. Do not feel that the lactic acid was elevated due to any acute infection feel it was due to to dehydration. Probably alcohol as well. Patient now stable for discharge from the ED and can be transferred to [facility].  Vanetta Mulders, MD 03/05/15 1159

## 2015-03-05 NOTE — ED Notes (Signed)
Pt eating peanut butter and graham crackers and drinking diet sprite

## 2015-03-05 NOTE — ED Notes (Signed)
Pt sleeping, low BP, MD aware, orders given.

## 2015-03-05 NOTE — ED Notes (Signed)
Report called to Evangeline Gula at [facility]. Pt to be transported via [employer].

## 2015-03-05 NOTE — ED Notes (Signed)
CRITICAL VALUE ALERT  Critical value received:  Lactic acid 3.7  Date of notification:  03/05/15  Time of notification:  0752  Critical value read back:Yes.    Nurse who received alert:  Berdine Dance RN  MD notified (1st page):  Deretha Emory  Time of first page:  [phone]  MD notified (2nd page):  Time of second page:  Responding MD:  Deretha Emory  Time MD responded:  [phone]
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
## 2015-03-05 NOTE — ED Notes (Signed)
Denise from poison control, states pt needs supportive care x 6 hours. BS should not drop unless pt is aciodic, Obtain EKG

## 2015-03-05 NOTE — ED Notes (Signed)
Pt ambulatory to the bathroom 

## 2015-03-05 NOTE — ED Notes (Signed)
Pt on cardiac monitor.
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
## 2015-03-05 NOTE — ED Notes (Signed)
Poison control notified of new lactic acid and BMET results, pt. Cleared medically per Poison control. Pt is alert and oriented.

## 2015-03-05 NOTE — ED Notes (Signed)
telepsy in progress 

## 2015-03-05 NOTE — BH Assessment (Signed)
Pt has been accepted to [facility] by Donell Sievert, PA. Pt has been assigned to 401 bed 2 by [facility] ([facility]). Pt may arrive after 0830. Attending will be Dr.Cobos. Writer has informed EDP Dr.Knapp of acceptance.

## 2015-03-05 NOTE — ED Notes (Addendum)
Called Poison Control
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
## 2015-03-05 NOTE — ED Notes (Signed)
Per Poison control, Metformin overdose can cause profound acidosis and they will continue to follow patient and will call back for BMET/ Lactic acid results which are to be drawn when patient finishes current normal saline bolus.

## 2015-03-05 NOTE — ED Notes (Signed)
Pt almost finished with bolus, reminded pt. to keep arm straight to allow for bolus administration.

## 2015-03-05 NOTE — ED Notes (Signed)
Bolus finished, lab at bedside to draw lactic acid/ BMET.
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
## 2015-03-06 ENCOUNTER — Encounter (HOSPITAL_COMMUNITY): Payer: Self-pay | Admitting: Psychiatry

## 2015-03-06 DIAGNOSIS — F1024 Alcohol dependence with alcohol-induced mood disorder: Secondary | ICD-10-CM

## 2015-03-06 DIAGNOSIS — F332 Major depressive disorder, recurrent severe without psychotic features: Secondary | ICD-10-CM

## 2015-03-06 LAB — GLUCOSE, CAPILLARY: GLUCOSE-CAPILLARY: 116 mg/dL — AB (ref 65–99)

## 2015-03-06 MED ORDER — HYDROXYZINE HCL 25 MG PO TABS: 25.0000 mg | ORAL_TABLET | Freq: Four times a day (QID) | ORAL | Status: DC | PRN

## 2015-03-06 MED ORDER — LOPERAMIDE HCL 2 MG PO CAPS: 2.0000 mg | ORAL_CAPSULE | ORAL | Status: DC | PRN

## 2015-03-06 MED ORDER — CLONIDINE HCL 0.1 MG PO TABS
0.1000 mg | ORAL_TABLET | Freq: Every day | ORAL | Status: DC
  Filled 2015-03-06 (×2): qty 1

## 2015-03-06 MED ORDER — ADULT MULTIVITAMIN W/MINERALS CH
1.0000 | ORAL_TABLET | Freq: Every day | ORAL | Status: DC
  Administered 2015-03-06 – 2015-03-09 (×4): 1 via ORAL
  Filled 2015-03-06 (×7): qty 1

## 2015-03-06 MED ORDER — HYDROXYZINE HCL 25 MG PO TABS
25.0000 mg | ORAL_TABLET | Freq: Four times a day (QID) | ORAL | Status: DC | PRN
  Administered 2015-03-07 – 2015-03-09 (×3): 25 mg via ORAL
  Filled 2015-03-06: qty 10
  Filled 2015-03-06 (×3): qty 1

## 2015-03-06 MED ORDER — CLONIDINE HCL 0.1 MG PO TABS
0.1000 mg | ORAL_TABLET | Freq: Four times a day (QID) | ORAL | Status: AC
  Administered 2015-03-06 – 2015-03-07 (×4): 0.1 mg via ORAL
  Filled 2015-03-06 (×6): qty 1

## 2015-03-06 MED ORDER — LORAZEPAM 1 MG PO TABS
1.0000 mg | ORAL_TABLET | Freq: Four times a day (QID) | ORAL | Status: DC | PRN
  Administered 2015-03-06 – 2015-03-09 (×8): 1 mg via ORAL
  Filled 2015-03-06 (×9): qty 1

## 2015-03-06 MED ORDER — LOPERAMIDE HCL 2 MG PO CAPS
2.0000 mg | ORAL_CAPSULE | ORAL | Status: DC | PRN
  Administered 2015-03-08: 4 mg via ORAL
  Filled 2015-03-06: qty 2

## 2015-03-06 MED ORDER — DICYCLOMINE HCL 20 MG PO TABS: 20.0000 mg | ORAL_TABLET | Freq: Four times a day (QID) | ORAL | Status: DC | PRN

## 2015-03-06 MED ORDER — CLONIDINE HCL 0.1 MG PO TABS
0.1000 mg | ORAL_TABLET | ORAL | Status: DC
Start: 2015-03-08 — End: 2015-03-09
  Administered 2015-03-08 (×2): 0.1 mg via ORAL
  Filled 2015-03-06 (×5): qty 1

## 2015-03-06 MED ORDER — DULOXETINE HCL 30 MG PO CPEP
30.0000 mg | ORAL_CAPSULE | Freq: Every day | ORAL | Status: DC
  Administered 2015-03-07: 30 mg via ORAL
  Filled 2015-03-06 (×2): qty 1

## 2015-03-06 MED ORDER — ONDANSETRON 4 MG PO TBDP
4.0000 mg | ORAL_TABLET | Freq: Four times a day (QID) | ORAL | Status: DC | PRN
Start: 2015-03-06 — End: 2015-03-06

## 2015-03-06 MED ORDER — GABAPENTIN 100 MG PO CAPS
100.0000 mg | ORAL_CAPSULE | Freq: Three times a day (TID) | ORAL | Status: DC
  Administered 2015-03-06 – 2015-03-07 (×2): 100 mg via ORAL
  Filled 2015-03-06 (×5): qty 1

## 2015-03-06 MED ORDER — THIAMINE HCL 100 MG/ML IJ SOLN
100.0000 mg | Freq: Once | INTRAMUSCULAR | Status: AC
  Administered 2015-03-06: 100 mg via INTRAMUSCULAR
  Filled 2015-03-06: qty 2

## 2015-03-06 MED ORDER — ONDANSETRON 4 MG PO TBDP: 4.0000 mg | ORAL_TABLET | Freq: Four times a day (QID) | ORAL | Status: DC | PRN

## 2015-03-06 MED ORDER — NAPROXEN 500 MG PO TABS
500.0000 mg | ORAL_TABLET | Freq: Two times a day (BID) | ORAL | Status: DC | PRN
  Administered 2015-03-07: 500 mg via ORAL
  Filled 2015-03-06: qty 1

## 2015-03-06 MED ORDER — METHOCARBAMOL 500 MG PO TABS
500.0000 mg | ORAL_TABLET | Freq: Three times a day (TID) | ORAL | Status: DC | PRN
  Administered 2015-03-06: 500 mg via ORAL
  Filled 2015-03-06: qty 1

## 2015-03-06 MED ORDER — VITAMIN B-1 100 MG PO TABS
100.0000 mg | ORAL_TABLET | Freq: Every day | ORAL | Status: DC
  Administered 2015-03-07 – 2015-03-09 (×3): 100 mg via ORAL
  Filled 2015-03-06 (×5): qty 1

## 2015-03-06 NOTE — Progress Notes (Signed)
Pt has been in bed all evening since coming back to the unit from dinner.  Pt reports that she is not feeling well.  She reports that she has a toothache that has been hurting since dinner.  Pt says that the tooth has been bothering her off and on for a while.  Pt denies SI/HI/AVH at the time of assessment.  The evening provider was notified of pt's tooth pain and orders were received and meds given.  Pt chose to stay in bed instead of going to group.  Pt was encouraged to make her needs known to staff.  Discharge plans are in process.  Support and encouragement offered.  Safety maintained with q15 minute checks.

## 2015-03-06 NOTE — Progress Notes (Signed)
Recreation Therapy Notes  Date: 02.22.2017 Time: 9:30am Location: [address] Group Room   Group Topic: Stress Management  Goal Area(s) Addresses:  Patient will actively participate in stress management techniques presented during session.   Behavioral Response: Did not attend.   Raedyn Wenke L Jinnifer Montejano, LRT/CTRS        Suleika Donavan L 03/06/2015 10:22 AM 

## 2015-03-06 NOTE — [facility] Group Notes (Signed)
[facility] LCSW Group Therapy 03/06/2015 1:15 PM  Type of Therapy: Group Therapy- Emotion Regulation  Pt did not attend, declined invitation.   Chad Cordial, LCSWA 03/06/2015 4:29 PM
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
## 2015-03-06 NOTE — Progress Notes (Signed)
D: Pt presents with flat affect and depressed mood. Pt rates depression 5/10. Anxiety 5/10. Hopeless 5/10. Pt denies suicidal thoughts. Pt reports withdrawal symptoms of tremors, chills, cravings and agitation. Pt requesting to be started back on her home meds of Adderall and Oxycodone. Dr. Jama Flavors made aware of pt request. Writer also encouraged pt to write down list of home meds and discuss them with MD during initial assessment. A: Medications reviewed with pt. Medications administered as ordered per MD. Verbal support provided. Pt encouraged to attend groups.15 minute checks performed for safety. R: Pt stated goal is to work on depression. Pt receptive to tx.
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
## 2015-03-06 NOTE — Progress Notes (Signed)
Adult Psychoeducational Group Note  Date:  03/06/2015 Time:  9:54 PM  Group Topic/Focus:  Wrap-Up Group:   The focus of this group is to help patients review their daily goal of treatment and discuss progress on daily workbooks.  Participation Level:  Did Not Attend  Participation Quality:  Did not attend  Affect:  Did not attend  Cognitive:  Did not attend  Insight: None  Engagement in Group:  Did not attend  Modes of Intervention:  Did not attend  Additional Comments:  Patient did not attend wrap up group tonight.   Harjit Leider L Ayaana Biondo 03/06/2015, 9:54 PM

## 2015-03-07 DIAGNOSIS — F329 Major depressive disorder, single episode, unspecified: Principal | ICD-10-CM

## 2015-03-07 LAB — GLUCOSE, CAPILLARY: Glucose-Capillary: 116 mg/dL — ABNORMAL HIGH (ref 65–99)

## 2015-03-07 MED ORDER — GABAPENTIN 100 MG PO CAPS
200.0000 mg | ORAL_CAPSULE | Freq: Three times a day (TID) | ORAL | Status: DC
  Administered 2015-03-07 – 2015-03-09 (×6): 200 mg via ORAL
  Administered 2015-03-09: 100 mg via ORAL
  Filled 2015-03-07: qty 42
  Filled 2015-03-07 (×3): qty 2
  Filled 2015-03-07: qty 42
  Filled 2015-03-07: qty 2
  Filled 2015-03-07: qty 42
  Filled 2015-03-07 (×2): qty 2
  Filled 2015-03-07: qty 42
  Filled 2015-03-07 (×3): qty 2
  Filled 2015-03-07: qty 42
  Filled 2015-03-07: qty 2
  Filled 2015-03-07: qty 42

## 2015-03-07 MED ORDER — GABAPENTIN 100 MG PO CAPS: 200.0000 mg | ORAL_CAPSULE | Freq: Two times a day (BID) | ORAL | Status: DC

## 2015-03-07 MED ORDER — DULOXETINE HCL 20 MG PO CPEP
40.0000 mg | ORAL_CAPSULE | Freq: Every day | ORAL | Status: DC
  Administered 2015-03-08 – 2015-03-09 (×2): 40 mg via ORAL
  Filled 2015-03-07: qty 2
  Filled 2015-03-07 (×2): qty 14
  Filled 2015-03-07 (×2): qty 2

## 2015-03-07 NOTE — Progress Notes (Signed)
Patient ID: Julie Burnett, female   DOB: 14-Apr-1970, 45 y.o.   MRN: [id number]   Pt currently presents with a flat affect and cooperative behavior. Per self inventory, pt rates depression at a 2, hopelessness 0 and anxiety 5. Pt main complaint is withdrawal, states "ativan is the only thing that has helped for me in the past." Pt's daily goal is to "get out of here" and they intend to do so by "talk to doctor." Pt reports fair sleep, a good appetite, low energy and poor concentration.   Pt provided with medications per providers orders. Pt's labs and vitals were monitored throughout the day. Pt supported emotionally and encouraged to express concerns and questions. Pt educated on medications use, abuse and dependence.   Pt's safety ensured with 15 minute and environmental checks. Pt currently denies SI/HI and A/V hallucinations. Pt verbally agrees to seek staff if SI/HI or A/VH occurs and to consult with staff before acting on these thoughts. Will continue POC.

## 2015-03-07 NOTE — Progress Notes (Signed)
Adult Psychoeducational Group Note  Date:  03/07/2015 Time:  9:23 PM  Group Topic/Focus:  Wrap-Up Group:   The focus of this group is to help patients review their daily goal of treatment and discuss progress on daily workbooks.  Participation Level:  Active  Participation Quality:  Appropriate  Affect:  Appropriate  Cognitive:  Alert  Insight: Appropriate  Engagement in Group:  Engaged  Modes of Intervention:  Discussion  Additional Comments:  Patient stated her day started off boring and depressing, but got better as the day went on. Patient stated that she would be discharging on Saturday. Patient goal for today was to stay positive.   Dawne Casali L Mavis Gravelle 03/07/2015, 9:23 PM

## 2015-03-07 NOTE — [facility] Suicide Risk Assessment (Signed)
[facility] INPATIENT:  Family/Significant Other Suicide Prevention Education  Suicide Prevention Education:  Patient Refusal for Family/Significant Other Suicide Prevention Education: The patient Julie Burnett has refused to provide written consent for family/significant other to be provided Family/Significant Other Suicide Prevention Education during admission and/or prior to discharge.  Physician notified.  Elaina Hoops 03/07/2015, 3:27 PM

## 2015-03-07 NOTE — Progress Notes (Signed)
Pt had a visit from her boyfriend during visitation, but afterwards she went to bed and did not go to group.  When writer went to her room to speak with her, conversation was minimal and pt declined the scheduled Clonidine 0.1 mg .  Pt denies SI/HJI/AVH.  She was encouraged to make her needs known to staff.  Discharge plans are in process.  Support and encouragement offered.  Safety maintained with q15 minute checks.

## 2015-03-07 NOTE — Tx Team (Signed)
Interdisciplinary Treatment Plan Update (Adult) Date: 03/07/2015   Date: 03/07/2015 9:08 AM  Progress in Treatment:  Attending groups: No  Participating in groups: No Taking medication as prescribed: Yes  Tolerating medication: Yes  Family/Significant othe contact made: No, CSW assessing for appropriate contact Patient understands diagnosis: Yes AEB seeking help with depression Discussing patient identified problems/goals with staff: Yes  Medical problems stabilized or resolved: Yes  Denies suicidal/homicidal ideation: Yes Patient has not harmed self or Others: Yes   New problem(s) identified: None identified at this time.   Discharge Plan or Barriers: CSW will assess for appropriate discharge plan and relevant barriers.   Additional comments:  Patient and CSW reviewed pt's identified goals and treatment plan. Patient verbalized understanding and agreed to treatment plan. CSW reviewed [facility] "Discharge Process and Patient Involvement" Form. Pt verbalized understanding of information provided and signed form.   Reason for Continuation of Hospitalization:  Depression Medication stabilization Suicidal ideation Withdrawal symptoms  Estimated length of stay: 1-2 days  Review of initial/current patient goals per problem list:   1.  Goal(s): Patient will participate in aftercare plan  Met:  Yes  Target date: 3-5 days from date of admission   As evidenced by: Patient will participate within aftercare plan AEB aftercare provider and housing plan at discharge being identified. 03/07/15: Pt will return home and follow-up with [facility]  2.  Goal (s): Patient will exhibit decreased depressive symptoms and suicidal ideations.  Met:  Progressing  Target date: 3-5 days from date of admission   As evidenced by: Patient will utilize self rating of depression at 3 or below and demonstrate decreased signs of depression or be deemed stable for discharge by MD.  03/07/15: Pt rates  depression at 5/10 and denies SI at this time.   4.  Goal(s): Patient will demonstrate decreased signs of withdrawal due to substance abuse  Met:  No  Target date: 3-5 days from date of admission   As evidenced by: Patient will produce a CIWA/COWS score of 0, have stable vitals signs, and no symptoms of withdrawal  03/07/15: Pt CIWA score of 8; endorses sweating, bone aches, termor, and anxiety as symptoms of withdrawal.  Attendees:  Patient:    Family:    Physician: Dr. Parke Poisson, MD  03/07/2015 9:08 AM  Nursing: Lars Pinks, RN Case manager  03/07/2015 9:08 AM  Clinical Social Worker Peri Maris, [place] 03/07/2015 9:08 AM  Other: Erasmo Downer Drinkard, [place] 03/07/2015 9:08 AM  Clinical:  Marcella Dubs, RN; Marnee Guarneri, RN 03/07/2015 9:08 AM  Other: , RN Charge Nurse 03/07/2015 9:08 AM  Other: Hilda Lias, [place], [place] Work [phone]

## 2015-03-07 NOTE — [facility] Group Notes (Signed)
[facility] LCSW Group Therapy 03/07/2015 1:15pm  Type of Therapy: Group Therapy- [facility]  Pt did not attend, declined invitation.    Chad Cordial, LCSWA 03/07/2015 2:59 PM

## 2015-03-07 NOTE — Progress Notes (Signed)
[facility] MD Progress Note  03/07/2015 2:41 PM Julie Burnett  MRN:  [id number] Subjective:   Patient reports partial improvement compared to admission. She states she is feeling " a little better", and overall less depressed. At this time does not endorse any medication side effects. Objective : I  Have discussed case with treatment team and have met with patient At this time not presenting with significant withdrawal symptoms, does not appear to be in any acute distress. Vitals stable. No tremors, no diaphoresis. Currently tolerating medications well , no side effects reported. Continues to present with some depression and constricted affect ,but reports feeling better than on admission Limited group participation, no disruptive behaviors on unit.  Principal Problem: Alcohol dependence ([place]) Diagnosis:   Patient Active Problem List   Diagnosis Date Noted  . MDD (major depressive disorder) ([place]) [F32.9] 03/05/2015  . Major depressive disorder, recurrent episode, severe, without mention of psychotic behavior [F33.2] 07/25/2012  . Alcohol withdrawal ([place]) [F10.239] 07/25/2012  . Alcohol dependence ([place]) [F10.20] 07/25/2012  . Opioid dependence ([place]) [F11.20] 11/12/2011  . Polysubstance dependence ([place]) [F19.20] 11/12/2011  . Diabetes mellitus ([place]) [E11.9] 11/12/2011   Total Time spent with patient: 20 minutes    Past Medical History:  Past Medical History  Diagnosis Date  . Anxiety   . Bipolar 1 disorder ([place])   . Depression   . Liver disease   . Diabetes mellitus   . High cholesterol    History reviewed. No pertinent past surgical history. Family History: History reviewed. No pertinent family history.  Social History:  History  Alcohol Use  . 3.6 oz/week  . 6 Cans of beer per week    Comment: 6-12 pack daily; usually 12 oz     History  Drug Use  . Yes  . Special: Marijuana, "Crack" cocaine    Social History   Social History  . Marital Status: Single    Spouse  Name: N/A  . Number of Children: N/A  . Years of Education: N/A   Social History Main Topics  . Smoking status: Current Every Day Smoker -- 1.50 packs/day for 17 years    Types: Cigarettes  . Smokeless tobacco: Never Used  . Alcohol Use: 3.6 oz/week    6 Cans of beer per week     Comment: 6-12 pack daily; usually 12 oz  . Drug Use: Yes    Special: Marijuana, "Crack" cocaine  . Sexual Activity: Yes    Birth Control/ Protection: Condom   Other Topics Concern  . None   Social History Narrative   Additional Social History:   Sleep: improving   Appetite:  Good  Current Medications: Current Facility-Administered Medications  Medication Dose Route Frequency Provider Last Rate Last Dose  . acetaminophen (TYLENOL) tablet 650 mg  650 mg Oral Q6H PRN Kerrie Buffalo, NP   650 mg at 03/07/15 0902  . alum & mag hydroxide-simeth (MAALOX/MYLANTA) 200-200-20 MG/5ML suspension 30 mL  30 mL Oral Q4H PRN Kerrie Buffalo, NP      . cloNIDine (CATAPRES) tablet 0.1 mg  0.1 mg Oral QID Jenne Campus, MD   0.1 mg at 03/07/15 1211   Followed by  . [START ON 03/08/2015] cloNIDine (CATAPRES) tablet 0.1 mg  0.1 mg Oral BH-qamhs Jenne Campus, MD       Followed by  . [START ON 03/10/2015] cloNIDine (CATAPRES) tablet 0.1 mg  0.1 mg Oral QAC breakfast Jenne Campus, MD      . dicyclomine (BENTYL)  tablet 20 mg  20 mg Oral Q6H PRN Jenne Campus, MD      . Derrill Memo ON 03/08/2015] DULoxetine (CYMBALTA) DR capsule 40 mg  40 mg Oral Daily Myer Peer Shawnetta Lein, MD      . gabapentin (NEURONTIN) capsule 200 mg  200 mg Oral TID Jenne Campus, MD   200 mg at 03/07/15 1211  . hydrOXYzine (ATARAX/VISTARIL) tablet 25 mg  25 mg Oral Q6H PRN Jenne Campus, MD      . ibuprofen (ADVIL,MOTRIN) tablet 600 mg  600 mg Oral Q6H PRN Laverle Hobby, PA-C   600 mg at 03/06/15 1357  . loperamide (IMODIUM) capsule 2-4 mg  2-4 mg Oral PRN Jenne Campus, MD      . LORazepam (ATIVAN) tablet 1 mg  1 mg Oral Q6H PRN Jenne Campus, MD   1 mg at 03/07/15 0902  . magnesium hydroxide (MILK OF MAGNESIA) suspension 30 mL  30 mL Oral Daily PRN Kerrie Buffalo, NP      . metFORMIN (GLUCOPHAGE) tablet 500 mg  500 mg Oral BID WC Kerrie Buffalo, NP   500 mg at 03/07/15 0858  . methocarbamol (ROBAXIN) tablet 500 mg  500 mg Oral Q8H PRN Jenne Campus, MD   500 mg at 03/06/15 1531  . multivitamin with minerals tablet 1 tablet  1 tablet Oral Daily Jenne Campus, MD   1 tablet at 03/07/15 0858  . naproxen (NAPROSYN) tablet 500 mg  500 mg Oral BID PRN Jenne Campus, MD   500 mg at 03/07/15 0047  . nicotine (NICODERM CQ - dosed in mg/24 hours) patch 21 mg  21 mg Transdermal Daily Myer Peer Jerian Morais, MD   21 mg at 03/07/15 0900  . ondansetron (ZOFRAN-ODT) disintegrating tablet 4 mg  4 mg Oral Q6H PRN Jenne Campus, MD      . thiamine (VITAMIN B-1) tablet 100 mg  100 mg Oral Daily Jenne Campus, MD   100 mg at 03/07/15 0858  . traZODone (DESYREL) tablet 50 mg  50 mg Oral QHS PRN Kerrie Buffalo, NP   50 mg at 03/07/15 1610    Lab Results:  Results for orders placed or performed during the hospital encounter of 03/05/15 (from the past 48 hour(s))  Glucose, capillary     Status: Abnormal   Collection Time: 03/05/15  4:41 PM  Result Value Ref Range   Glucose-Capillary 136 (H) 65 - 99 mg/dL   Comment 1 Notify RN    Comment 2 Document in Chart   Glucose, capillary     Status: Abnormal   Collection Time: 03/06/15  5:50 AM  Result Value Ref Range   Glucose-Capillary 116 (H) 65 - 99 mg/dL   Comment 1 Notify RN    Comment 2 Document in Chart   Glucose, capillary     Status: Abnormal   Collection Time: 03/07/15  6:13 AM  Result Value Ref Range   Glucose-Capillary 116 (H) 65 - 99 mg/dL    Blood Alcohol level:  Lab Results  Component Value Date   ETH 197* 03/05/2015   ETH 59* 07/23/2012    Physical Findings: AIMS: Facial and Oral Movements Muscles of Facial Expression: None, normal Lips and Perioral Area: None,  normal Jaw: None, normal Tongue: None, normal,Extremity Movements Upper (arms, wrists, hands, fingers): None, normal Lower (legs, knees, ankles, toes): None, normal, Trunk Movements Neck, shoulders, hips: None, normal, Overall Severity Severity of abnormal movements (highest score from questions  above): None, normal Incapacitation due to abnormal movements: None, normal Patient's awareness of abnormal movements (rate only patient's report): No Awareness, Dental Status Current problems with teeth and/or dentures?: No Does patient usually wear dentures?: No  CIWA:  CIWA-Ar Total: 7 COWS:  COWS Total Score: 10  Musculoskeletal: Strength & Muscle Tone: within normal limits- no tremors , no diaphoresis, no restlessness  Gait & Station: normal Patient leans: N/A  Psychiatric Specialty Exam: ROS no headache, no chest pain, no shortness of breath, reports some chronic pain  Blood pressure 110/61, pulse 62, temperature 99.3 F (37.4 C), temperature source Oral, resp. rate 16, height '5\' 4"'$  (1.626 m), weight 177 lb (80.287 kg), SpO2 99 %.Body mass index is 30.37 kg/(m^2).  General Appearance: Fairly Groomed  Engineer, water::  Fair  Speech:  Normal Rate  Volume:  Normal  Mood:  Depressed, but improved compared to admission   Affect:  Constricted and but does smile briefly at times   Thought Process:  Linear  Orientation:  Full (Time, Place, and Person)  Thought Content:  denies hallucinations, no delusions   Suicidal Thoughts:  No at this time denies any suicidal ideations and denies any self injurious ideations  Homicidal Thoughts:  No  Memory:  recent and remote grossly intact   Judgement:  Fair  Insight:  Fair  Psychomotor Activity:  Normal  Concentration:  Good  Recall:  Good  Fund of Knowledge:Good  Language: Good  Akathisia:  Negative  Handed:  Right  AIMS (if indicated):     Assets:  Desire for Improvement Resilience  ADL's:  Intact  Cognition: WNL  Sleep:  Number of Hours:  6.75  Assessment- patient remains depressed, but has improved compared to admission. At this time not presenting with severe symptoms of opiate or alcohol  WDL, and does not appear to be in any acute distress, vitals are stable. Tolerating Cymbalta and Neurontin well thus far . Treatment Plan Summary: Daily contact with patient to assess and evaluate symptoms and progress in treatment, Medication management, Plan inpatient treatment  and medications as below  Encourage increased group, milieu participation in order to work on coping skills and symptom reduction Continue Clonidine taper to minimize risk of opiate WDL Continue Cymbalta at 40 mgrs QDAY for depression, anxiety Continue  Neurontin at 200 mgrs TID for management of pain and anxiety Continue Ativan PRNs for possible ETOH withdrawal symptoms.  Neita Garnet, MD 03/07/2015, 2:41 PM

## 2015-03-08 LAB — GLUCOSE, CAPILLARY: GLUCOSE-CAPILLARY: 115 mg/dL — AB (ref 65–99)

## 2015-03-08 NOTE — Progress Notes (Signed)
Patient ID: Julie Burnett, female   DOB: Jan 11, 1971, 45 y.o.   MRN: [id number]  D: Patient reports agitation on approach due to a staff waking her up to fill out a self inventory sheet. Reports depression "4", hopelessness "5", and anxiety "5". Reports some cravings, anxiety, chilling, and irritability. Contracts for safety on the unit. Patient less irritable the more I speak with her. A: Staff will monitor on q 15 minute checks, follow treatment plan, and give meds as ordered. R: Cooperative on the unit.

## 2015-03-08 NOTE — Progress Notes (Signed)
[facility] Group Notes:  (Nursing/MHT/Case Management/Adjunct)  Date:  03/08/2015  Time:  8:55 PM  Type of Therapy:  Psychoeducational Skills  Participation Level:  Active  Participation Quality:  Appropriate  Affect:  Appropriate  Cognitive:  Appropriate  Insight:  Improving  Engagement in Group:  Developing/Improving  Modes of Intervention:  Education  Summary of Progress/Problems: The patient verbalized in group that she had a "rough day" overall and that she slept for the majority of the day. As for the theme of the day, her relapse prevention will include keeping all of her appointments and walking away from people who bother her.   Hazle Coca S 03/08/2015, 8:55 PM

## 2015-03-08 NOTE — [facility] Group Notes (Signed)
[facility] LCSW Aftercare Discharge Planning Group Note  03/08/2015  8:45 AM  Participation Quality: Did Not Attend. Patient invited to participate but declined.  Littleton Haub, MSW, LCSW Clinical Social Worker [facility] [phone]   

## 2015-03-08 NOTE — Progress Notes (Signed)
  D: When asked about her day pt stated, "I feel great. He said something about letting me go home Saturday, but I might go home tomorrow".  Pt also request that neurontin be given at hs instead of 1700. Pt has no questions or concerns.    A:  Support and encouragement was offered. 15 min checks continued for safety.  R: Pt remains safe.

## 2015-03-08 NOTE — [facility] Group Notes (Signed)
[facility] LCSW Group Therapy 03/08/2015 1:15pm  Type of Therapy: Group Therapy- Feelings Around Relapse and Recovery  Participation Level: Active   Participation Quality:  Appropriate  Affect:  Appropriate  Cognitive: Alert and Oriented   Insight:  Developing   Engagement in Therapy: Developing/Improving and Engaged   Modes of Intervention: Clarification, Confrontation, Discussion, Education, Exploration, Limit-setting, Orientation, Problem-solving, Rapport Building, Dance movement psychotherapist, Socialization and Support  Summary of Progress/Problems: The topic for today was feelings about relapse. The group discussed what relapse prevention is to them and identified triggers that they are on the path to relapse. Members also processed their feeling towards relapse and were able to relate to common experiences. Group also discussed coping skills that can be used for relapse prevention.   Pt discussed conflict and feelings that her daughter was trying to control her behavior led to spiteful behaviors which triggered her recent relapse. She expressed that this relapse has been positive as she has gained clarity about a plan to prevent future relapses.   Therapeutic Modalities:   Cognitive Behavioral Therapy Solution-Focused Therapy Assertiveness Training Relapse Prevention Therapy    Lamar Sprinkles [phone] 03/08/2015 3:32 PM
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
## 2015-03-08 NOTE — Progress Notes (Signed)
Recreation Therapy Notes  Date: 02.24.2017 Time: 9:30am Location: [address] Group Room  Group Topic: Stress Management  Goal Area(s) Addresses:  Patient will actively participate in stress management techniques presented during session.   Behavioral Response: Did not attend.   Marykay Lex Divine Imber, LRT/CTRS  Oberon Hehir L 03/08/2015 10:16 AM

## 2015-03-09 DIAGNOSIS — F10230 Alcohol dependence with withdrawal, uncomplicated: Secondary | ICD-10-CM

## 2015-03-09 LAB — GLUCOSE, CAPILLARY: Glucose-Capillary: 92 mg/dL (ref 65–99)

## 2015-03-09 MED ORDER — TRAZODONE HCL 50 MG PO TABS: 50.0000 mg | ORAL_TABLET | Freq: Every evening | ORAL | Status: DC | PRN

## 2015-03-09 MED ORDER — GABAPENTIN 100 MG PO CAPS: 200.0000 mg | ORAL_CAPSULE | Freq: Three times a day (TID) | ORAL | Status: DC

## 2015-03-09 MED ORDER — DULOXETINE HCL 40 MG PO CPEP: 40.0000 mg | ORAL_CAPSULE | Freq: Every day | ORAL | Status: DC

## 2015-03-09 MED ORDER — HYDROXYZINE HCL 25 MG PO TABS: 25.0000 mg | ORAL_TABLET | Freq: Four times a day (QID) | ORAL | Status: DC | PRN

## 2015-03-09 MED ORDER — METFORMIN HCL 500 MG PO TABS: 500.0000 mg | ORAL_TABLET | Freq: Two times a day (BID) | ORAL | Status: DC
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
## 2015-03-09 NOTE — [facility] Group Notes (Signed)
03/09/2015  10:00 AM   Type of Therapy and Topic: Group Therapy: Preventing Self Sabotage   Participation Level: Patient not in group in preparation for discharge.    Beverly Sessions MSW, LCSW
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
## 2015-03-09 NOTE — Progress Notes (Signed)
  [facility] Adult Case Management Discharge Plan :  Will you be returning to the same living situation after discharge:  Yes,  returning home At discharge, do you have transportation home?: Yes,  boyfriend will pick patient up Do you have the ability to pay for your medications: Yes,  access to medication, follow up provider to assist  Release of information consent forms completed and in the chart;  Patient's signature needed at discharge.  Patient to Follow up at: Follow-up Information    Follow up with [facility].   Why:  Therapy appointment on 3/10 @ 3:30pm and a medication management appointment on 3/16 at 2:00pm.   Contact information:   [address], [address] [place], [place]  [address] Ph:  [phone]  (fax) [phone]      Next level of care provider has access to [facility] Link:no  Safety Planning and Suicide Prevention discussed: Yes, completed with patient, patient refused contact with support person  Have you used any form of tobacco in the last 30 days? (Cigarettes, Smokeless Tobacco, Cigars, and/or Pipes): Yes  Has patient been referred to the Quitline?: Patient refused referral  Patient has been referred for addiction treatment: N/A  Horton, Salome Arnt 03/09/2015, 10:03 AM

## 2015-03-09 NOTE — Progress Notes (Signed)
D) Pt being discharged to home accompanied by her daughter. Affect and mood are appropriate. Pt denies SI and HI, delusions and hallucinations. Pt rates her depression, hopelessness and hopelessness all at a 1.  A) given support, reassurance and praise. Provided with a 1:1. Encouragement given. All belongings returned to Pt. AVS, [facility] Transition Record and the  Physicians Summary given to Pt. All medications explained as well as all follow up plans. R) Denies SI and HI.

## 2015-03-10 NOTE — [facility] Group Notes (Signed)
[facility] Group Notes:  (Nursing/MHT/Case Management/Adjunct)  Date:  02/2502017 Time:  1300  Type of Therapy:  Nurse Education  /  Life SKills : The group focuses on teaching patients how to identify their needs.  Participation Level:  Did Not Attend  Participation Quality:    Affect:    Cognitive:    Insight:    Engagement in Group:    Modes of Intervention:    Summary of Progress/Problems:  Rich Brave 03/10/2015, 8:58 AM  Late entry

## 2015-05-06 ENCOUNTER — Emergency Department (HOSPITAL_COMMUNITY): Payer: Self-pay

## 2015-05-06 ENCOUNTER — Encounter (HOSPITAL_COMMUNITY): Payer: Self-pay

## 2015-05-06 ENCOUNTER — Emergency Department (HOSPITAL_COMMUNITY)
Admission: EM | Admit: 2015-05-06 | Discharge: 2015-05-06 | Disposition: A | Discharge status: Home / Self Care | Payer: Self-pay | Attending: Emergency Medicine | Admitting: Emergency Medicine

## 2015-05-06 ENCOUNTER — Encounter (HOSPITAL_COMMUNITY): Payer: Self-pay | Admitting: Emergency Medicine

## 2015-05-06 DIAGNOSIS — Z79891 Long term (current) use of opiate analgesic: Secondary | ICD-10-CM | POA: Insufficient documentation

## 2015-05-06 DIAGNOSIS — Z7984 Long term (current) use of oral hypoglycemic drugs: Secondary | ICD-10-CM | POA: Insufficient documentation

## 2015-05-06 DIAGNOSIS — Y999 Unspecified external cause status: Secondary | ICD-10-CM | POA: Insufficient documentation

## 2015-05-06 DIAGNOSIS — Y939 Activity, unspecified: Secondary | ICD-10-CM | POA: Insufficient documentation

## 2015-05-06 DIAGNOSIS — S52501D Unspecified fracture of the lower end of right radius, subsequent encounter for closed fracture with routine healing: Secondary | ICD-10-CM | POA: Insufficient documentation

## 2015-05-06 DIAGNOSIS — W010XXA Fall on same level from slipping, tripping and stumbling without subsequent striking against object, initial encounter: Secondary | ICD-10-CM | POA: Insufficient documentation

## 2015-05-06 DIAGNOSIS — F329 Major depressive disorder, single episode, unspecified: Secondary | ICD-10-CM | POA: Insufficient documentation

## 2015-05-06 DIAGNOSIS — F319 Bipolar disorder, unspecified: Secondary | ICD-10-CM | POA: Insufficient documentation

## 2015-05-06 DIAGNOSIS — F17210 Nicotine dependence, cigarettes, uncomplicated: Secondary | ICD-10-CM | POA: Insufficient documentation

## 2015-05-06 DIAGNOSIS — Y929 Unspecified place or not applicable: Secondary | ICD-10-CM | POA: Insufficient documentation

## 2015-05-06 DIAGNOSIS — S52122A Displaced fracture of head of left radius, initial encounter for closed fracture: Secondary | ICD-10-CM | POA: Insufficient documentation

## 2015-05-06 DIAGNOSIS — Z79899 Other long term (current) drug therapy: Secondary | ICD-10-CM | POA: Insufficient documentation

## 2015-05-06 DIAGNOSIS — S52501A Unspecified fracture of the lower end of right radius, initial encounter for closed fracture: Secondary | ICD-10-CM | POA: Insufficient documentation

## 2015-05-06 DIAGNOSIS — E119 Type 2 diabetes mellitus without complications: Secondary | ICD-10-CM | POA: Insufficient documentation

## 2015-05-06 DIAGNOSIS — Y9389 Activity, other specified: Secondary | ICD-10-CM | POA: Insufficient documentation

## 2015-05-06 DIAGNOSIS — S5291XD Unspecified fracture of right forearm, subsequent encounter for closed fracture with routine healing: Secondary | ICD-10-CM

## 2015-05-06 LAB — CBG MONITORING, ED: Glucose-Capillary: 96 mg/dL (ref 65–99)

## 2015-05-06 MED ORDER — OXYCODONE-ACETAMINOPHEN 5-325 MG PO TABS
2.0000 | ORAL_TABLET | Freq: Once | ORAL | Status: AC
  Administered 2015-05-06: 2 via ORAL
  Filled 2015-05-06: qty 2

## 2015-05-06 MED ORDER — MELOXICAM 7.5 MG PO TABS: 7.5000 mg | ORAL_TABLET | Freq: Every day | ORAL | Status: DC

## 2015-05-06 MED ORDER — KETOROLAC TROMETHAMINE 60 MG/2ML IM SOLN
60.0000 mg | Freq: Once | INTRAMUSCULAR | Status: AC
  Administered 2015-05-06: 60 mg via INTRAMUSCULAR
  Filled 2015-05-06: qty 2

## 2015-05-06 MED ORDER — OXYCODONE-ACETAMINOPHEN 5-325 MG PO TABS: 1.0000 | ORAL_TABLET | Freq: Four times a day (QID) | ORAL | Status: DC | PRN

## 2015-05-06 NOTE — ED Notes (Signed)
Pt ambulated to restroom with steady and even gait at this time. Pt put in sling and instructed on cast and sling use and care. Pt verbalized understanding.
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
## 2015-05-06 NOTE — ED Notes (Signed)
Pt ambulated with steady and even gait at this time, pt stated she is going to wait in lobby.
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
## 2015-05-06 NOTE — ED Notes (Signed)
Pt given blue scrub pants and socks at this time.

## 2015-05-06 NOTE — Discharge Instructions (Signed)
Wear arm sling as applied until followed up by orthopedics.  Ice for 20 minutes every 2 hours while awake for the next 2 days. Keep her arm elevated as much as possible to help with swelling.  Follow-up with Dr. Romeo Apple from orthopedics. The contact information for his office has been provided in this discharge summary for you to call and arrange this appointment.   Radial Fracture A radial fracture is a break in the radius bone, which is the long bone of the forearm that is on the same side as your thumb. Your forearm is the part of your arm that is between your elbow and your wrist. It is made up of two bones: the radius and the ulna. Most radial fractures occur near the wrist (distal radialfracture) or near the elbow (radial head fracture). A distal radial fracture is the most common type of broken arm. This fracture usually occurs about an inch above the wrist. Fractures of the middle part of the bone are less common. CAUSES  Falling with your arm outstretched is the most common cause of a radial fracture. Other causes include:  Car accidents.  Bike accidents.  A direct blow to the middle part of the radius. RISK FACTORS  You may be at greater risk for a distal radial fracture if you are 65 years of age or older.  You may be at greater risk for a radial head fracture if you are:  Female.  65-36 years old.  You may be at a greater risk for all types of radial fractures if you have a condition that causes your bones to be weak or thin (osteoporosis). SIGNS AND SYMPTOMS A radial fracture causes pain immediately after the injury. Other signs and symptoms include:  An abnormal bend or bump in your arm (deformity).  Swelling.  Bruising.  Numbness or tingling.  Tenderness.  Limited movement. DIAGNOSIS  Your health care provider may diagnose a radial fracture based on:  Your symptoms.  Your medical history, including any recent injury.  A physical exam. Your health  care provider will look for any deformity and feel for tenderness over the break. Your health care provider will also check whether the bone is out of place.  An X-ray exam to confirm the diagnosis and learn more about the type of fracture. TREATMENT The goals of treatment are to get the bone in proper position for healing and to keep it from moving so it will heal over time. Your treatment will depend on many factors, especially the type of fracture that you have.  If the fractured bone:  Is in the correct position (nondisplaced), you may only need to wear a cast or a splint.  Has a slightly displaced fracture, you may need to have the bones moved back into place manually (closed reduction) before the splint or cast is put on.  You may have a temporary splint before you have a plaster cast. The splint allows room for some swelling. After a few days, a cast can replace the splint.  You may have to wear the cast for about 6 weeks or as directed by your health care provider.  The cast may be changed after about 3 weeks or as directed by your health care provider.  After your cast is taken off, you may need physical therapy to regain full movement in your wrist or elbow.  You may need emergency surgery if you have:  A fractured bone that is out of position (displaced).  A  fracture with multiple fragments (comminuted fracture).  A fracture that breaks the skin (open fracture). This type of fracture may require surgical wires, plates, or screws to hold the bone in place.  You may have X-rays every couple of weeks to check on your healing. HOME CARE INSTRUCTIONS  Keep the injured arm above the level of your heart while you are sitting or lying down. This helps to reduce swelling and pain.  Apply ice to the injured area:  Put ice in a plastic bag.  Place a towel between your skin and the bag.  Leave the ice on for 20 minutes, 2-3 times per day.  Move your fingers often to avoid  stiffness and to minimize swelling.  If you have a plaster or fiberglass cast:  Do not try to scratch the skin under the cast using sharp or pointed objects.  Check the skin around the cast every day. You may put lotion on any red or sore areas.  Keep your cast dry and clean.  If you have a plaster splint:  Wear the splint as directed.  Loosen the elastic around the splint if your fingers become numb and tingle, or if they turn cold and blue.  Do not put pressure on any part of your cast until it is fully hardened. Rest your cast only on a pillow for the first 24 hours.  Protect your cast or splint while bathing or showering, as directed by your health care provider. Do not put your cast or splint into water.  Take medicines only as directed by your health care provider.  Return to activities, such as sports, as directed by your health care provider. Ask your health care provider what activities are safe for you.  Keep all follow-up visits as directed by your health care provider. This is important. SEEK MEDICAL CARE IF:  Your pain medicine is not helping.  Your cast gets damaged or it breaks.  Your cast becomes loose.  Your cast gets wet.  You have more severe pain or swelling than you did before the cast.  You have severe pain when stretching your fingers.  You continue to have pain or stiffness in your elbow or your wrist after your cast is taken off. SEEK IMMEDIATE MEDICAL CARE IF:  You cannot move your fingers.  You lose feeling in your fingers or your hand.  Your hand or your fingers turn cold and pale or blue.  You notice a bad smell coming from your cast.  You have drainage from underneath your cast.  You have new stains from blood or drainage seeping through your cast.   This information is not intended to replace advice given to you by your health care provider. Make sure you discuss any questions you have with your health care provider.   Document  Released: 06/11/2005 Document Revised: 01/19/2014 Document Reviewed: 06/23/2013 Elsevier Interactive Patient Education [employer]2016 [employer]

## 2015-05-06 NOTE — ED Notes (Signed)
Patient states her cast on her right arm is hurting wants the cast redone. RN made aware.
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
## 2015-05-06 NOTE — ED Notes (Signed)
Pt not in waiting room

## 2015-05-06 NOTE — ED Notes (Signed)
Security to escort pt to x-ray with tech.

## 2015-05-06 NOTE — ED Notes (Signed)
Per x-ray technician pt states she would like to leave, MD DeLo notified. Spoke with pt and states she needs to speak to MD, MD at bedside.
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
## 2015-05-06 NOTE — Discharge Instructions (Signed)
Cast or Splint Care °Casts and splints support injured limbs and keep bones from moving while they heal. It is important to care for your cast or splint at home.   °HOME CARE INSTRUCTIONS °· Keep the cast or splint uncovered during the drying period. It can take 24 to 48 hours to dry if it is made of plaster. A fiberglass cast will dry in less than 1 hour. °· Do not rest the cast on anything harder than a pillow for the first 24 hours. °· Do not put weight on your injured limb or apply pressure to the cast until your health care provider gives you permission. °· Keep the cast or splint dry. Wet casts or splints can lose their shape and may not support the limb as well. A wet cast that has lost its shape can also create harmful pressure on your skin when it dries. Also, wet skin can become infected. °· Cover the cast or splint with a plastic bag when bathing or when out in the rain or snow. If the cast is on the trunk of the body, take sponge baths until the cast is removed. °· If your cast does become wet, dry it with a towel or a blow dryer on the cool setting only. °· Keep your cast or splint clean. Soiled casts may be wiped with a moistened cloth. °· Do not place any hard or soft foreign objects under your cast or splint, such as cotton, toilet paper, lotion, or powder. °· Do not try to scratch the skin under the cast with any object. The object could get stuck inside the cast. Also, scratching could lead to an infection. If itching is a problem, use a blow dryer on a cool setting to relieve discomfort. °· Do not trim or cut your cast or remove padding from inside of it. °· Exercise all joints next to the injury that are not immobilized by the cast or splint. For example, if you have a long leg cast, exercise the hip joint and toes. If you have an arm cast or splint, exercise the shoulder, elbow, thumb, and fingers. °· Elevate your injured arm or leg on 1 or 2 pillows for the first 1 to 3 days to decrease  swelling and pain. It is best if you can comfortably elevate your cast so it is higher than your heart. °SEEK MEDICAL CARE IF:  °· Your cast or splint cracks. °· Your cast or splint is too tight or too loose. °· You have unbearable itching inside the cast. °· Your cast becomes wet or develops a soft spot or area. °· You have a bad smell coming from inside your cast. °· You get an object stuck under your cast. °· Your skin around the cast becomes red or raw. °· You have new pain or worsening pain after the cast has been applied. °SEEK IMMEDIATE MEDICAL CARE IF:  °· You have fluid leaking through the cast. °· You are unable to move your fingers or toes. °· You have discolored (blue or white), cool, painful, or very swollen fingers or toes beyond the cast. °· You have tingling or numbness around the injured area. °· You have severe pain or pressure under the cast. °· You have any difficulty with your breathing or have shortness of breath. °· You have chest pain. °  °This information is not intended to replace advice given to you by your health care provider. Make sure you discuss any questions you have with your health care   provider.   Document Released: 12/27/1999 Document Revised: 10/19/2012 Document Reviewed: 07/07/2012 Elsevier Interactive Patient Education 2016 Else[employer] Inc[employer]  Radial Fracture A radial fracture is a break in the radius bone, which is the long bone of the forearm that is on the same side as your thumb. Your forearm is the part of your arm that is between your elbow and your wrist. It is made up of two bones: the radius and the ulna. Most radial fractures occur near the wrist (distal radialfracture) or near the elbow (radial head fracture). A distal radial fracture is the most common type of broken arm. This fracture usually occurs about an inch above the wrist. Fractures of the middle part of the bone are less common. CAUSES  Falling with your arm outstretched is the most common cause of  a radial fracture. Other causes include:  Car accidents.  Bike accidents.  A direct blow to the middle part of the radius. RISK FACTORS  You may be at greater risk for a distal radial fracture if you are 66 years of age or older.  You may be at greater risk for a radial head fracture if you are:  Female.  70-30 years old.  You may be at a greater risk for all types of radial fractures if you have a condition that causes your bones to be weak or thin (osteoporosis). SIGNS AND SYMPTOMS A radial fracture causes pain immediately after the injury. Other signs and symptoms include:  An abnormal bend or bump in your arm (deformity).  Swelling.  Bruising.  Numbness or tingling.  Tenderness.  Limited movement. DIAGNOSIS  Your health care provider may diagnose a radial fracture based on:  Your symptoms.  Your medical history, including any recent injury.  A physical exam. Your health care provider will look for any deformity and feel for tenderness over the break. Your health care provider will also check whether the bone is out of place.  An X-ray exam to confirm the diagnosis and learn more about the type of fracture. TREATMENT The goals of treatment are to get the bone in proper position for healing and to keep it from moving so it will heal over time. Your treatment will depend on many factors, especially the type of fracture that you have.  If the fractured bone:  Is in the correct position (nondisplaced), you may only need to wear a cast or a splint.  Has a slightly displaced fracture, you may need to have the bones moved back into place manually (closed reduction) before the splint or cast is put on.  You may have a temporary splint before you have a plaster cast. The splint allows room for some swelling. After a few days, a cast can replace the splint.  You may have to wear the cast for about 6 weeks or as directed by your health care provider.  The cast may be  changed after about 3 weeks or as directed by your health care provider.  After your cast is taken off, you may need physical therapy to regain full movement in your wrist or elbow.  You may need emergency surgery if you have:  A fractured bone that is out of position (displaced).  A fracture with multiple fragments (comminuted fracture).  A fracture that breaks the skin (open fracture). This type of fracture may require surgical wires, plates, or screws to hold the bone in place.  You may have X-rays every couple of weeks to check on your healing.  HOME CARE INSTRUCTIONS  Keep the injured arm above the level of your heart while you are sitting or lying down. This helps to reduce swelling and pain.  Apply ice to the injured area:  Put ice in a plastic bag.  Place a towel between your skin and the bag.  Leave the ice on for 20 minutes, 2-3 times per day.  Move your fingers often to avoid stiffness and to minimize swelling.  If you have a plaster or fiberglass cast:  Do not try to scratch the skin under the cast using sharp or pointed objects.  Check the skin around the cast every day. You may put lotion on any red or sore areas.  Keep your cast dry and clean.  If you have a plaster splint:  Wear the splint as directed.  Loosen the elastic around the splint if your fingers become numb and tingle, or if they turn cold and blue.  Do not put pressure on any part of your cast until it is fully hardened. Rest your cast only on a pillow for the first 24 hours.  Protect your cast or splint while bathing or showering, as directed by your health care provider. Do not put your cast or splint into water.  Take medicines only as directed by your health care provider.  Return to activities, such as sports, as directed by your health care provider. Ask your health care provider what activities are safe for you.  Keep all follow-up visits as directed by your health care provider. This  is important. SEEK MEDICAL CARE IF:  Your pain medicine is not helping.  Your cast gets damaged or it breaks.  Your cast becomes loose.  Your cast gets wet.  You have more severe pain or swelling than you did before the cast.  You have severe pain when stretching your fingers.  You continue to have pain or stiffness in your elbow or your wrist after your cast is taken off. SEEK IMMEDIATE MEDICAL CARE IF:  You cannot move your fingers.  You lose feeling in your fingers or your hand.  Your hand or your fingers turn cold and pale or blue.  You notice a bad smell coming from your cast.  You have drainage from underneath your cast.  You have new stains from blood or drainage seeping through your cast.   This information is not intended to replace advice given to you by your health care provider. Make sure you discuss any questions you have with your health care provider.   Document Released: 06/11/2005 Document Revised: 01/19/2014 Document Reviewed: 06/23/2013 Elsevier Interactive Patient Education [employer]2016 Else[employer] Inc.    Call the orthopedist listed for an office visit this week.  You may get the prescription filled you were given last night.  I cannot change this medicine.  You may add the anti - inflammatory I am prescribing you which can help with pain additionally.

## 2015-05-08 ENCOUNTER — Encounter (HOSPITAL_COMMUNITY): Payer: Self-pay | Admitting: Emergency Medicine

## 2015-05-08 ENCOUNTER — Emergency Department (HOSPITAL_COMMUNITY)
Admission: EM | Admit: 2015-05-08 | Discharge: 2015-05-08 | Discharge status: Against Medical Advice | Payer: Self-pay | Attending: Emergency Medicine | Admitting: Emergency Medicine

## 2015-05-08 ENCOUNTER — Emergency Department (HOSPITAL_COMMUNITY): Payer: Self-pay

## 2015-05-08 DIAGNOSIS — F329 Major depressive disorder, single episode, unspecified: Secondary | ICD-10-CM | POA: Insufficient documentation

## 2015-05-08 DIAGNOSIS — F17210 Nicotine dependence, cigarettes, uncomplicated: Secondary | ICD-10-CM | POA: Insufficient documentation

## 2015-05-08 DIAGNOSIS — M79631 Pain in right forearm: Secondary | ICD-10-CM | POA: Insufficient documentation

## 2015-05-08 DIAGNOSIS — E119 Type 2 diabetes mellitus without complications: Secondary | ICD-10-CM | POA: Insufficient documentation

## 2015-05-08 DIAGNOSIS — S5290XA Unspecified fracture of unspecified forearm, initial encounter for closed fracture: Secondary | ICD-10-CM

## 2015-05-08 MED ORDER — HYDROCODONE-ACETAMINOPHEN 5-325 MG PO TABS
1.0000 | ORAL_TABLET | Freq: Once | ORAL | Status: AC
  Administered 2015-05-08: 1 via ORAL
  Filled 2015-05-08: qty 1

## 2015-05-08 MED ORDER — IBUPROFEN 400 MG PO TABS
600.0000 mg | ORAL_TABLET | Freq: Once | ORAL | Status: AC
  Administered 2015-05-08: 600 mg via ORAL
  Filled 2015-05-08: qty 2

## 2015-05-08 NOTE — ED Provider Notes (Signed)
CSN: [id number]     Arrival date & time 05/08/15  0911 History  By signing my name below, I, Marica Otter, attest that this documentation has been prepared under the direction and in the presence of No att. providers found. Electronically Signed: Marica Otter, ED Scribe. 05/08/2015. 9:54 AM.  Chief Complaint  Patient presents with  . Arm Injury   The history is provided by the patient. No language interpreter was used.   PCP: Isabella Stalling, MD HPI Comments: Julie Burnett is a 45 y.o. female, with PMHx noted below,  who presents to the Emergency Department complaining of persistent, worsening right forearm pain with associated swelling onset 3 days ago following a fall. Pt endorses a fever stating she felt warm but denies documented fever. Pt reports taking Naproxen and Oxycodone at home for her Sx. Pt reports she has an appointment with Ortho in one week. Pt has been treated at the ED for the same on 05/06/15.   Past Medical History  Diagnosis Date  . Anxiety   . Bipolar 1 disorder (HCC)   . Depression   . Liver disease   . Diabetes mellitus   . High cholesterol    History reviewed. No pertinent past surgical history. No family history on file. Social History  Substance Use Topics  . Smoking status: Current Every Day Smoker -- 1.50 packs/day for 17 years    Types: Cigarettes  . Smokeless tobacco: Never Used  . Alcohol Use: 3.6 oz/week    6 Cans of beer per week     Comment: 6-12 pack daily; usually 12 oz   OB History    No data available     Review of Systems  Constitutional: Positive for fever.  Musculoskeletal: Positive for joint swelling (right forearm) and arthralgias (right forearm pain ).  All other systems reviewed and are negative.  Allergies  Codeine  Home Medications   Prior to Admission medications   Medication Sig Start Date End Date Taking? Authorizing Provider  amphetamine-dextroamphetamine (ADDERALL) 20 MG tablet Take 20 mg by mouth daily.     Historical Provider, MD  DULoxetine 40 MG CPEP Take 40 mg by mouth daily. Patient not taking: Reported on 05/06/2015 03/09/15   Oneta Rack, NP  gabapentin (NEURONTIN) 100 MG capsule Take 2 capsules (200 mg total) by mouth 3 (three) times daily. 03/09/15   Oneta Rack, NP  hydrOXYzine (ATARAX/VISTARIL) 25 MG tablet Take 1 tablet (25 mg total) by mouth every 6 (six) hours as needed for anxiety. Patient not taking: Reported on 05/06/2015 03/09/15   Oneta Rack, NP  meloxicam (MOBIC) 7.5 MG tablet Take 1-2 tablets (7.5-15 mg total) by mouth daily. 05/06/15   Burgess Amor, PA-C  metFORMIN (GLUCOPHAGE) 500 MG tablet Take 1 tablet (500 mg total) by mouth 2 (two) times daily with a meal. 03/09/15   Oneta Rack, NP  oxyCODONE-acetaminophen (PERCOCET) 5-325 MG tablet Take 1-2 tablets by mouth every 6 (six) hours as needed. 05/06/15   Geoffery Lyons, MD  pregabalin (LYRICA) 75 MG capsule Take 75 mg by mouth 2 (two) times daily.    Historical Provider, MD  traZODone (DESYREL) 50 MG tablet Take 1 tablet (50 mg total) by mouth at bedtime as needed for sleep. Patient not taking: Reported on 05/06/2015 03/09/15   Oneta Rack, NP   Triage Vitals: BP 132/95 mmHg  Pulse 79  Temp(Src) 97.9 F (36.6 C) (Oral)  Resp 16  Ht  (1.651 m)  Wt 165  lb (74.844 kg)  BMI 27.46 kg/m2  SpO2 98%  LMP 04/15/2015 Physical Exam  Constitutional: She is oriented to person, place, and time. She appears well-developed and well-nourished.  HENT:  Head: Normocephalic.  Eyes: EOM are normal.  Neck: Normal range of motion.  Pulmonary/Chest: Effort normal.  Abdominal: She exhibits no distension.  Musculoskeletal: Normal range of motion.  Pt tender distal right forearm, proximal medial carpal bones on right. 2+ pulse distal, right arm. Pt can move her fingers. Compartments are soft. Sensation intact distal right arm. No sign infection.   Neurological: She is alert and oriented to person, place, and time.  Nursing note and  vitals reviewed.   ED Course  Procedures (including critical care time) DIAGNOSTIC STUDIES: Oxygen Saturation is 98% on ra, nl by my interpretation.    COORDINATION OF CARE: 9:43 AM: Discussed treatment plan with pt at bedside; patient verbalizes understanding and agrees with treatment plan.  Imaging Review No results found. I have personally reviewed and evaluated these images as part of my medical decision-making.    MDM   Final diagnoses:  None   Patient presents for pain and swelling in her right arm. Patient has bilateral forearm fractures in splints in place. Patient denied any new injuries. No pain with passive ROM. No sign of compartment syndrome or infection on exam. Discussed importance of elevating the arm using ice and intermittent pain meds as needed. Plan to repeat splint, with thumb spica, pt refused.  Patient requesting prescription for repeat narcotics. Patient is on narcotics from her primary doctor and I recommended she follow-up with her primary for repeat prescription and she was just given 1 for her fractured arm.  Plan for x-ray to look for any change since her previous.  Pain medicines ordered.  Patient refused x-ray and said she would follow-up with her primary doctor. Patient refused any further treatment in the ER and left prior to reassessment.  Filed Vitals:   05/08/15 0925  BP: 132/95  Pulse: 79  Temp: 97.9 F (36.6 C)  Resp: 16     Blane OharaJoshua Aleese Kamps, MD 05/08/15 1132

## 2015-05-08 NOTE — ED Notes (Signed)
Patient with swelling and increased pain to right arm. Recent radial fracture with splinting in place. Splint loosened. CMS intact with right arm.

## 2015-05-08 NOTE — ED Notes (Signed)
Pt told that MD put in orders for repeat x-rays on bilateral arms, pt reports that she did not want xrays and wanted to walk out and go see her doctor so he could re-fill her percocet prescription.  Pt verbalized understanding of leaving AMA.  Dr. Jodi MourningZavitz informed.

## 2015-05-10 ENCOUNTER — Encounter (HOSPITAL_BASED_OUTPATIENT_CLINIC_OR_DEPARTMENT_OTHER): Payer: Self-pay | Admitting: *Deleted

## 2015-05-10 ENCOUNTER — Other Ambulatory Visit: Payer: Self-pay | Admitting: Orthopedic Surgery

## 2015-05-10 ENCOUNTER — Other Ambulatory Visit (HOSPITAL_COMMUNITY): Payer: Self-pay | Admitting: Orthopedic Surgery

## 2015-05-10 DIAGNOSIS — T148XXA Other injury of unspecified body region, initial encounter: Secondary | ICD-10-CM

## 2015-05-14 ENCOUNTER — Encounter (HOSPITAL_BASED_OUTPATIENT_CLINIC_OR_DEPARTMENT_OTHER): Payer: Self-pay | Admitting: *Deleted

## 2015-05-14 ENCOUNTER — Ambulatory Visit (HOSPITAL_COMMUNITY)
Admission: RE | Admit: 2015-05-14 | Discharge: 2015-05-14 | Disposition: A | Discharge status: Home / Self Care | Payer: Self-pay | Source: Ambulatory Visit | Attending: Orthopedic Surgery | Admitting: Orthopedic Surgery

## 2015-05-14 DIAGNOSIS — S52571A Other intraarticular fracture of lower end of right radius, initial encounter for closed fracture: Secondary | ICD-10-CM | POA: Insufficient documentation

## 2015-05-14 DIAGNOSIS — S62024A Nondisplaced fracture of middle third of navicular [scaphoid] bone of right wrist, initial encounter for closed fracture: Secondary | ICD-10-CM | POA: Insufficient documentation

## 2015-05-14 DIAGNOSIS — T148XXA Other injury of unspecified body region, initial encounter: Secondary | ICD-10-CM

## 2015-05-16 ENCOUNTER — Encounter (HOSPITAL_BASED_OUTPATIENT_CLINIC_OR_DEPARTMENT_OTHER): Payer: Self-pay | Admitting: Anesthesiology

## 2015-05-16 ENCOUNTER — Ambulatory Visit (HOSPITAL_BASED_OUTPATIENT_CLINIC_OR_DEPARTMENT_OTHER): Payer: Self-pay | Admitting: Anesthesiology

## 2015-05-16 ENCOUNTER — Ambulatory Visit (HOSPITAL_BASED_OUTPATIENT_CLINIC_OR_DEPARTMENT_OTHER)
Admission: RE | Admit: 2015-05-16 | Discharge: 2015-05-16 | Disposition: A | Discharge status: Home / Self Care | Payer: Self-pay | Source: Ambulatory Visit | Attending: Orthopedic Surgery | Admitting: Orthopedic Surgery

## 2015-05-16 ENCOUNTER — Encounter (HOSPITAL_BASED_OUTPATIENT_CLINIC_OR_DEPARTMENT_OTHER)
Admission: RE | Disposition: A | Discharge status: Home / Self Care | Payer: Self-pay | Source: Ambulatory Visit | Attending: Orthopedic Surgery

## 2015-05-16 DIAGNOSIS — W010XXA Fall on same level from slipping, tripping and stumbling without subsequent striking against object, initial encounter: Secondary | ICD-10-CM | POA: Insufficient documentation

## 2015-05-16 DIAGNOSIS — E119 Type 2 diabetes mellitus without complications: Secondary | ICD-10-CM | POA: Insufficient documentation

## 2015-05-16 DIAGNOSIS — S52571A Other intraarticular fracture of lower end of right radius, initial encounter for closed fracture: Secondary | ICD-10-CM | POA: Insufficient documentation

## 2015-05-16 DIAGNOSIS — Z7984 Long term (current) use of oral hypoglycemic drugs: Secondary | ICD-10-CM | POA: Insufficient documentation

## 2015-05-16 DIAGNOSIS — S62011A Displaced fracture of distal pole of navicular [scaphoid] bone of right wrist, initial encounter for closed fracture: Secondary | ICD-10-CM | POA: Insufficient documentation

## 2015-05-16 DIAGNOSIS — F17210 Nicotine dependence, cigarettes, uncomplicated: Secondary | ICD-10-CM | POA: Insufficient documentation

## 2015-05-16 HISTORY — DX: Alcohol dependence, uncomplicated: F10.20

## 2015-05-16 HISTORY — PX: ORIF SCAPHOID FRACTURE: SHX2130

## 2015-05-16 HISTORY — DX: Other psychoactive substance abuse, uncomplicated: F19.10

## 2015-05-16 HISTORY — DX: Personal history of self-harm: Z91.5

## 2015-05-16 HISTORY — PX: OPEN REDUCTION INTERNAL FIXATION (ORIF) DISTAL RADIAL FRACTURE: SHX5989

## 2015-05-16 HISTORY — DX: Personal history of suicidal behavior: Z91.51

## 2015-05-16 LAB — POCT I-STAT, CHEM 8
BUN: 9 mg/dL (ref 6–20)
CHLORIDE: 96 mmol/L — AB (ref 101–111)
CREATININE: 0.5 mg/dL (ref 0.44–1.00)
Calcium, Ion: 1.05 mmol/L — ABNORMAL LOW (ref 1.12–1.23)
Glucose, Bld: 141 mg/dL — ABNORMAL HIGH (ref 65–99)
HEMATOCRIT: 38 % (ref 36.0–46.0)
Hemoglobin: 12.9 g/dL (ref 12.0–15.0)
POTASSIUM: 4.1 mmol/L (ref 3.5–5.1)
SODIUM: 134 mmol/L — AB (ref 135–145)
TCO2: 25 mmol/L (ref 0–100)

## 2015-05-16 LAB — GLUCOSE, CAPILLARY: GLUCOSE-CAPILLARY: 121 mg/dL — AB (ref 65–99)

## 2015-05-16 SURGERY — OPEN REDUCTION INTERNAL FIXATION (ORIF) DISTAL RADIUS FRACTURE
Anesthesia: General | Site: Wrist | Laterality: Right

## 2015-05-16 MED ORDER — LIDOCAINE 2% (20 MG/ML) 5 ML SYRINGE
INTRAMUSCULAR | Status: AC
  Filled 2015-05-16: qty 5

## 2015-05-16 MED ORDER — MEPERIDINE HCL 25 MG/ML IJ SOLN: 6.2500 mg | INTRAMUSCULAR | Status: DC | PRN

## 2015-05-16 MED ORDER — FENTANYL CITRATE (PF) 100 MCG/2ML IJ SOLN
INTRAMUSCULAR | Status: AC
Start: 2015-05-16 — End: 2015-05-16
  Filled 2015-05-16: qty 2

## 2015-05-16 MED ORDER — PROPOFOL 10 MG/ML IV BOLUS
INTRAVENOUS | Status: AC
  Filled 2015-05-16: qty 40

## 2015-05-16 MED ORDER — MIDAZOLAM HCL 2 MG/2ML IJ SOLN
INTRAMUSCULAR | Status: AC
  Filled 2015-05-16: qty 2

## 2015-05-16 MED ORDER — DEXAMETHASONE SODIUM PHOSPHATE 4 MG/ML IJ SOLN
INTRAMUSCULAR | Status: DC | PRN
  Administered 2015-05-16: 10 mg via INTRAVENOUS

## 2015-05-16 MED ORDER — LACTATED RINGERS IV SOLN
INTRAVENOUS | Status: DC
  Administered 2015-05-16 (×2): via INTRAVENOUS

## 2015-05-16 MED ORDER — BUPIVACAINE-EPINEPHRINE (PF) 0.5% -1:200000 IJ SOLN
INTRAMUSCULAR | Status: DC | PRN
Start: 2015-05-16 — End: 2015-05-16
  Administered 2015-05-16: 30 mL via PERINEURAL

## 2015-05-16 MED ORDER — FENTANYL CITRATE (PF) 100 MCG/2ML IJ SOLN
INTRAMUSCULAR | Status: DC | PRN
  Administered 2015-05-16: 100 ug via INTRAVENOUS

## 2015-05-16 MED ORDER — EPHEDRINE 5 MG/ML INJ
INTRAVENOUS | Status: AC
  Filled 2015-05-16: qty 10

## 2015-05-16 MED ORDER — OXYCODONE-ACETAMINOPHEN 10-325 MG PO TABS: ORAL_TABLET | ORAL | Status: DC

## 2015-05-16 MED ORDER — CEFAZOLIN SODIUM-DEXTROSE 2-4 GM/100ML-% IV SOLN
2.0000 g | INTRAVENOUS | Status: AC
  Administered 2015-05-16: 2 g via INTRAVENOUS

## 2015-05-16 MED ORDER — OXYCODONE HCL 5 MG/5ML PO SOLN: 5.0000 mg | Freq: Once | ORAL | Status: AC | PRN

## 2015-05-16 MED ORDER — DEXAMETHASONE SODIUM PHOSPHATE 10 MG/ML IJ SOLN
INTRAMUSCULAR | Status: AC
  Filled 2015-05-16: qty 1

## 2015-05-16 MED ORDER — FENTANYL CITRATE (PF) 100 MCG/2ML IJ SOLN
INTRAMUSCULAR | Status: AC
  Filled 2015-05-16: qty 2

## 2015-05-16 MED ORDER — GLYCOPYRROLATE 0.2 MG/ML IJ SOLN
INTRAMUSCULAR | Status: DC | PRN
Start: 2015-05-16 — End: 2015-05-16
  Administered 2015-05-16: 0.2 mg via INTRAVENOUS

## 2015-05-16 MED ORDER — SCOPOLAMINE 1 MG/3DAYS TD PT72: 1.0000 | MEDICATED_PATCH | Freq: Once | TRANSDERMAL | Status: DC | PRN

## 2015-05-16 MED ORDER — GLYCOPYRROLATE 0.2 MG/ML IV SOSY
PREFILLED_SYRINGE | INTRAVENOUS | Status: AC
  Filled 2015-05-16: qty 3

## 2015-05-16 MED ORDER — OXYCODONE HCL 5 MG PO TABS
5.0000 mg | ORAL_TABLET | Freq: Once | ORAL | Status: AC | PRN
  Administered 2015-05-16: 5 mg via ORAL

## 2015-05-16 MED ORDER — PROPOFOL 10 MG/ML IV BOLUS
INTRAVENOUS | Status: DC | PRN
  Administered 2015-05-16: 200 mg via INTRAVENOUS

## 2015-05-16 MED ORDER — HYDROMORPHONE HCL 1 MG/ML IJ SOLN: 0.2500 mg | INTRAMUSCULAR | Status: DC | PRN

## 2015-05-16 MED ORDER — GLYCOPYRROLATE 0.2 MG/ML IJ SOLN: 0.2000 mg | Freq: Once | INTRAMUSCULAR | Status: DC | PRN

## 2015-05-16 MED ORDER — FENTANYL CITRATE (PF) 100 MCG/2ML IJ SOLN
50.0000 ug | INTRAMUSCULAR | Status: DC | PRN
  Administered 2015-05-16 (×2): 100 ug via INTRAVENOUS

## 2015-05-16 MED ORDER — CHLORHEXIDINE GLUCONATE 4 % EX LIQD: 60.0000 mL | Freq: Once | CUTANEOUS | Status: DC

## 2015-05-16 MED ORDER — MIDAZOLAM HCL 5 MG/5ML IJ SOLN
INTRAMUSCULAR | Status: DC | PRN
  Administered 2015-05-16: 2 mg via INTRAVENOUS

## 2015-05-16 MED ORDER — OXYCODONE HCL 5 MG PO TABS
ORAL_TABLET | ORAL | Status: AC
  Filled 2015-05-16: qty 1

## 2015-05-16 MED ORDER — ONDANSETRON HCL 4 MG/2ML IJ SOLN
INTRAMUSCULAR | Status: DC | PRN
  Administered 2015-05-16: 4 mg via INTRAVENOUS

## 2015-05-16 MED ORDER — ONDANSETRON HCL 4 MG/2ML IJ SOLN
INTRAMUSCULAR | Status: AC
  Filled 2015-05-16: qty 2

## 2015-05-16 MED ORDER — EPHEDRINE SULFATE 50 MG/ML IJ SOLN
INTRAMUSCULAR | Status: DC | PRN
  Administered 2015-05-16: 10 mg via INTRAVENOUS

## 2015-05-16 MED ORDER — LIDOCAINE HCL (CARDIAC) 20 MG/ML IV SOLN
INTRAVENOUS | Status: DC | PRN
  Administered 2015-05-16: 50 mg via INTRAVENOUS

## 2015-05-16 MED ORDER — CEFAZOLIN SODIUM-DEXTROSE 2-4 GM/100ML-% IV SOLN
INTRAVENOUS | Status: AC
  Filled 2015-05-16: qty 100

## 2015-05-16 MED ORDER — MIDAZOLAM HCL 2 MG/2ML IJ SOLN
1.0000 mg | INTRAMUSCULAR | Status: DC | PRN
  Administered 2015-05-16: 2 mg via INTRAVENOUS

## 2015-05-16 SURGICAL SUPPLY — 87 items
BANDAGE ACE 3X5.8 VEL STRL LF (GAUZE/BANDAGES/DRESSINGS) ×3 IMPLANT
BIT DRILL 2.0 LNG QUCK RELEASE (BIT) IMPLANT
BIT DRILL 2.8X5 QR DISP (BIT) ×2 IMPLANT
BIT DRILL ACUTRAK 2 MINI (BIT) IMPLANT
BLADE MINI RND TIP GREEN BEAV (BLADE) IMPLANT
BLADE SURG 15 STRL LF DISP TIS (BLADE) ×2 IMPLANT
BLADE SURG 15 STRL SS (BLADE) ×6
BNDG CMPR 9X4 STRL LF SNTH (GAUZE/BANDAGES/DRESSINGS) ×1
BNDG ELASTIC 2X5.8 VLCR STR LF (GAUZE/BANDAGES/DRESSINGS) IMPLANT
BNDG ESMARK 4X9 LF (GAUZE/BANDAGES/DRESSINGS) ×3 IMPLANT
BNDG GAUZE ELAST 4 BULKY (GAUZE/BANDAGES/DRESSINGS) ×3 IMPLANT
BNDG PLASTER X FAST 3X3 WHT LF (CAST SUPPLIES) IMPLANT
BNDG PLSTR 9X3 FST ST WHT (CAST SUPPLIES)
CHLORAPREP W/TINT 26ML (MISCELLANEOUS) ×3 IMPLANT
CORDS BIPOLAR (ELECTRODE) ×3 IMPLANT
COVER BACK TABLE 60X90IN (DRAPES) ×3 IMPLANT
COVER MAYO STAND STRL (DRAPES) ×3 IMPLANT
CUFF TOURNIQUET SINGLE 18IN (TOURNIQUET CUFF) ×3 IMPLANT
DRAPE EXTREMITY T 121X128X90 (DRAPE) ×3 IMPLANT
DRAPE OEC MINIVIEW 54X84 (DRAPES) ×3 IMPLANT
DRAPE SURG 17X23 STRL (DRAPES) ×3 IMPLANT
DRILL 2.0 LNG QUICK RELEASE (BIT) ×3
DRILL ACUTRAK 2 MINI (BIT) ×3
GAUZE SPONGE 4X4 12PLY STRL (GAUZE/BANDAGES/DRESSINGS) ×3 IMPLANT
GAUZE XEROFORM 1X8 LF (GAUZE/BANDAGES/DRESSINGS) ×3 IMPLANT
GLOVE BIO SURGEON STRL SZ 6.5 (GLOVE) ×1 IMPLANT
GLOVE BIO SURGEON STRL SZ7.5 (GLOVE) ×3 IMPLANT
GLOVE BIO SURGEONS STRL SZ 6.5 (GLOVE) ×1
GLOVE BIOGEL PI IND STRL 7.0 (GLOVE) IMPLANT
GLOVE BIOGEL PI IND STRL 8 (GLOVE) ×1 IMPLANT
GLOVE BIOGEL PI IND STRL 8.5 (GLOVE) IMPLANT
GLOVE BIOGEL PI INDICATOR 7.0 (GLOVE) ×8
GLOVE BIOGEL PI INDICATOR 8 (GLOVE) ×2
GLOVE BIOGEL PI INDICATOR 8.5 (GLOVE) ×2
GLOVE ECLIPSE 6.5 STRL STRAW (GLOVE) ×2 IMPLANT
GLOVE SURG ORTHO 8.0 STRL STRW (GLOVE) ×2 IMPLANT
GOWN STRL REUS W/ TWL LRG LVL3 (GOWN DISPOSABLE) ×1 IMPLANT
GOWN STRL REUS W/ TWL XL LVL3 (GOWN DISPOSABLE) IMPLANT
GOWN STRL REUS W/TWL LRG LVL3 (GOWN DISPOSABLE) ×5 IMPLANT
GOWN STRL REUS W/TWL XL LVL3 (GOWN DISPOSABLE) ×6 IMPLANT
GUIDEWIRE ORTHO 0.054X6 (WIRE) ×6 IMPLANT
GUIDEWIRE ORTHO MINI ACTK .045 (WIRE) ×2 IMPLANT
IV CATH 14GX2 1/4 (CATHETERS) IMPLANT
NDL HYPO 25X1 1.5 SAFETY (NEEDLE) IMPLANT
NDL SAFETY ECLIPSE 18X1.5 (NEEDLE) IMPLANT
NEEDLE HYPO 18GX1.5 SHARP (NEEDLE)
NEEDLE HYPO 22GX1.5 SAFETY (NEEDLE) IMPLANT
NEEDLE HYPO 25X1 1.5 SAFETY (NEEDLE) IMPLANT
NS IRRIG 1000ML POUR BTL (IV SOLUTION) ×3 IMPLANT
PACK BASIN DAY SURGERY FS (CUSTOM PROCEDURE TRAY) ×3 IMPLANT
PAD CAST 3X4 CTTN HI CHSV (CAST SUPPLIES) ×1 IMPLANT
PADDING CAST ABS 4INX4YD NS (CAST SUPPLIES) ×2
PADDING CAST ABS COTTON 4X4 ST (CAST SUPPLIES) ×1 IMPLANT
PADDING CAST COTTON 3X4 STRL (CAST SUPPLIES) ×3
PLATE PROXIMAL VDU ACULOC (Plate) ×2 IMPLANT
SCREW ACTK 2 NL HEX 3.5.11 (Screw) ×2 IMPLANT
SCREW ACUTRAK 2 MINI 20MM (Screw) ×2 IMPLANT
SCREW CORT FT 18X2.3XLCK HEX (Screw) IMPLANT
SCREW CORT FT 20X2.3XLCK HEX (Screw) IMPLANT
SCREW CORTICAL LOCKING 2.3X18M (Screw) ×12 IMPLANT
SCREW CORTICAL LOCKING 2.3X20M (Screw) ×6 IMPLANT
SCREW FX18X2.3XSMTH LCK NS CRT (Screw) IMPLANT
SCREW FX20X2.3XSMTH LCK NS CRT (Screw) IMPLANT
SCREW NLCKG 13 3.5X13 HEXA (Screw) IMPLANT
SCREW NON LOCK 3.5X10MM (Screw) ×2 IMPLANT
SCREW NON-LOCK 3.5X13 (Screw) ×3 IMPLANT
SLEEVE SCD COMPRESS KNEE MED (MISCELLANEOUS) ×2 IMPLANT
SLING ARM FOAM STRAP LRG (SOFTGOODS) ×2 IMPLANT
SPLINT PLASTER CAST XFAST 3X15 (CAST SUPPLIES) IMPLANT
SPLINT PLASTER XTRA FASTSET 3X (CAST SUPPLIES) ×40
STOCKINETTE 4X48 STRL (DRAPES) ×3 IMPLANT
SUCTION FRAZIER HANDLE 10FR (MISCELLANEOUS)
SUCTION TUBE FRAZIER 10FR DISP (MISCELLANEOUS) IMPLANT
SUT ETHILON 3 0 PS 1 (SUTURE) IMPLANT
SUT ETHILON 4 0 PS 2 18 (SUTURE) ×5 IMPLANT
SUT VIC AB 2-0 SH 27 (SUTURE)
SUT VIC AB 2-0 SH 27XBRD (SUTURE) IMPLANT
SUT VIC AB 3-0 PS1 18 (SUTURE)
SUT VIC AB 3-0 PS1 18XBRD (SUTURE) IMPLANT
SUT VICRYL 4-0 PS2 18IN ABS (SUTURE) ×3 IMPLANT
SYR BULB 3OZ (MISCELLANEOUS) ×3 IMPLANT
SYR CONTROL 10ML LL (SYRINGE) IMPLANT
TOWEL OR 17X24 6PK STRL BLUE (TOWEL DISPOSABLE) ×6 IMPLANT
TOWEL OR NON WOVEN STRL DISP B (DISPOSABLE) ×3 IMPLANT
TUBE CONNECTING 20'X1/4 (TUBING)
TUBE CONNECTING 20X1/4 (TUBING) IMPLANT
UNDERPAD 30X30 (UNDERPADS AND DIAPERS) ×3 IMPLANT

## 2015-05-16 NOTE — Anesthesia Postprocedure Evaluation (Signed)
Anesthesia Post Note  Patient: Julie PopperBertha Burnett  Procedure(s) Performed: Procedure(s) (LRB): RIGHT OPEN REDUCTION INTERNAL FIXATION (ORIF) DISTAL RADIUS AND SCAPHOID ORIF (Right) RIGHT OPEN REDUCTION INTERNAL FIXATION (ORIF) SCAPHOID FRACTURE (Right)  Patient location during evaluation: PACU Anesthesia Type: General Level of consciousness: awake and alert Pain management: pain level controlled Vital Signs Assessment: post-procedure vital signs reviewed and stable Respiratory status: spontaneous breathing, nonlabored ventilation and respiratory function stable Cardiovascular status: blood pressure returned to baseline and stable Postop Assessment: no signs of nausea or vomiting Anesthetic complications: no    Last Vitals:  Filed Vitals:   05/16/15 1330 05/16/15 1338  BP: 110/57 117/73  Pulse: 90 98  Temp:    Resp: 16 18    Last Pain:  Filed Vitals:   05/16/15 1338  PainSc: 8                  Marija Calamari A

## 2015-05-16 NOTE — Discharge Instructions (Addendum)

## 2015-05-16 NOTE — H&P (Signed)
Julie Burnett is an 45 y.o. female.   Chief Complaint: right distal radius fracture and scaphoid fracture HPI: 45 yo lhd female states she tripped and fell on outstretched hands 1.5 weeks ago.  Seen at ED where XR revealed right distal radius fracture and possible scaphoid fracture.  Subsequent CT right wrist shows comminuted intraarticular distal radius fracture and scaphoid fracture.  Presents for operative fixation of right distal radius and scaphoid fractures.  Allergies:  Allergies  Allergen Reactions  . Codeine Itching and Rash    Past Medical History  Diagnosis Date  . Anxiety   . Bipolar 1 disorder (HCC)   . Depression   . Liver disease   . Diabetes mellitus   . High cholesterol   . H/O suicide attempt   . Alcohol dependence (HCC)   . Polysubstance abuse     Past Surgical History  Procedure Laterality Date  . No past surgeries      Family History: History reviewed. No pertinent family history.  Social History:   reports that she has been smoking Cigarettes.  She has a 25.5 pack-year smoking history. She has never used smokeless tobacco. She reports that she drinks about 3.6 oz of alcohol per week. She reports that she uses illicit drugs (Marijuana and "Crack" cocaine).  Medications: Medications Prior to Admission  Medication Sig Dispense Refill  . amphetamine-dextroamphetamine (ADDERALL) 20 MG tablet Take 20 mg by mouth daily.    Marland Kitchen LORazepam (ATIVAN) 1 MG tablet Take 1 mg by mouth 2 (two) times daily.    . metFORMIN (GLUCOPHAGE) 500 MG tablet Take 1 tablet (500 mg total) by mouth 2 (two) times daily with a meal. 30 tablet 0  . oxyCODONE-acetaminophen (PERCOCET) 5-325 MG tablet Take 1-2 tablets by mouth every 6 (six) hours as needed. 20 tablet 0  . pregabalin (LYRICA) 75 MG capsule Take 75 mg by mouth 2 (two) times daily.      Results for orders placed or performed during the hospital encounter of 05/16/15 (from the past 48 hour(s))  I-STAT, chem 8     Status:  Abnormal   Collection Time: 05/16/15 10:54 AM  Result Value Ref Range   Sodium 134 (L) 135 - 145 mmol/L   Potassium 4.1 3.5 - 5.1 mmol/L   Chloride 96 (L) 101 - 111 mmol/L   BUN 9 6 - 20 mg/dL   Creatinine, Ser 1.61 0.44 - 1.00 mg/dL   Glucose, Bld 096 (H) 65 - 99 mg/dL   Calcium, Ion 0.45 (L) 1.12 - 1.23 mmol/L   TCO2 25 0 - 100 mmol/L   Hemoglobin 12.9 12.0 - 15.0 g/dL   HCT 40.9 81.1 - 91.4 %    Ct Wrist Right Wo Contrast  05/14/2015  CLINICAL DATA:  Wrist pain and swelling. Distal radial fracture, questionable scaphoid fracture. EXAM: CT OF THE RIGHT WRIST WITHOUT CONTRAST TECHNIQUE: Multidetector CT imaging of the right wrist was performed according to the standard protocol. Multiplanar CT image reconstructions were also generated. COMPARISON:  05/06/2015 FINDINGS: Comminuted distal radial fracture with multiple fracture planes extending into the radiocarpal surface of the distal radius, and at least 1 fracture plane extending into the distal radioulnar joint. We confirm a nondisplaced acute fracture in the mid to distal scaphoid, for example shown on images 31 through 38 of series 11. No other carpal fractures are identified. No distal ulnar fracture is seen. I do not see any definite tendon entrapment within the distal radial fracture planes. IMPRESSION: 1. CT confirms a  nondisplaced transverse fracture at the junction of the mid and distal poles of the scaphoid. 2. Comminuted distal radial fracture with multiple fracture planes in the distal radiocarpal articular surface. The degree of comminution is greater than that suggested on the conventional radiographs. Electronically Signed   By: Gaylyn RongWalter  Liebkemann M.D.   On: 05/14/2015 15:54     A comprehensive review of systems was negative.  Blood pressure 120/78, pulse 79, temperature 97.8 F (36.6 C), temperature source Oral, resp. rate 20, height 5\' 5"  (1.651 m), weight 78.019 kg (172 lb), last menstrual period 04/22/2015, SpO2 98  %.  General appearance: alert, cooperative and appears older than stated age Head: Normocephalic, without obvious abnormality, atraumatic Neck: supple, symmetrical, trachea midline Resp: clear to auscultation bilaterally Cardio: regular rate and rhythm GI: non-tender Extremities: Intact sensation and capillary refill all digits.  +epl/fpl/io.  No wounds.  Pulses: 2+ and symmetric Skin: Skin color, texture, turgor normal. No rashes or lesions Neurologic: Grossly normal Incision/Wound:none  Assessment/Plan Right distal radius and scaphoid fractures.  Non operative and operative treatment options were discussed with the patient and patient wishes to proceed with operative treatment. Risks, benefits, and alternatives of surgery were discussed and the patient agrees with the plan of care.   Curvin Hunger R 05/16/2015, 11:05 AM

## 2015-05-16 NOTE — Op Note (Signed)
[id number] 

## 2015-05-16 NOTE — Op Note (Signed)
I assisted Surgeon(s) and Role:    * Betha LoaKevin Shavar Gorka, MD - Primary    * Cindee SaltGary Kayle Passarelli, MD - Assisting on the Procedure(s): RIGHT OPEN REDUCTION INTERNAL FIXATION (ORIF) DISTAL RADIUS AND SCAPHOID ORIF RIGHT OPEN REDUCTION INTERNAL FIXATION (ORIF) SCAPHOID FRACTURE on 05/16/2015.  I provided assistance on this case as follows: approach, retraction, placement of screw for scaphoid, insertion of scaphoid screw, approach to the radial fracture, retraction, reduction of the fracture, maintenance of the reductionn, fixation of the fracture, closure of the incision, dressing and splint application. Electronically signed by: Nicki ReaperKUZMA,Trinka Keshishyan R, MD Date: 05/16/2015 Time: 1:06 PM

## 2015-05-16 NOTE — Progress Notes (Signed)
Assisted Dr. Crews with right, ultrasound guided, infraclavicular block. Side rails up, monitors on throughout procedure. See vital signs in flow sheet. Tolerated Procedure well. 

## 2015-05-16 NOTE — Anesthesia Preprocedure Evaluation (Addendum)
Anesthesia Evaluation  Patient identified by MRN, date of birth, ID band Patient awake    Reviewed: Allergy & Precautions, NPO status , Patient's Chart, lab work & pertinent test results  Airway Mallampati: I  TM Distance: >3 FB Neck ROM: Full    Dental  (+) Teeth Intact, Dental Advisory Given, Missing   Pulmonary Current Smoker,    breath sounds clear to auscultation       Cardiovascular  Rhythm:Regular Rate:Normal     Neuro/Psych    GI/Hepatic   Endo/Other  diabetes, Well Controlled, Type 2, Oral Hypoglycemic Agents  Renal/GU      Musculoskeletal   Abdominal   Peds  Hematology   Anesthesia Other Findings   Reproductive/Obstetrics                            Anesthesia Physical Anesthesia Plan  ASA: III  Anesthesia Plan: General   Post-op Pain Management: GA combined w/ Regional for post-op pain   Induction: Intravenous  Airway Management Planned: LMA  Additional Equipment:   Intra-op Plan:   Post-operative Plan: Extubation in OR  Informed Consent: I have reviewed the patients History and Physical, chart, labs and discussed the procedure including the risks, benefits and alternatives for the proposed anesthesia with the patient or authorized representative who has indicated his/her understanding and acceptance.   Dental advisory given  Plan Discussed with: CRNA, Surgeon and Anesthesiologist  Anesthesia Plan Comments:         Anesthesia Quick Evaluation

## 2015-05-16 NOTE — Brief Op Note (Addendum)
05/16/2015  1:03 PM  PATIENT:  Julie Burnett  45 y.o. female  PRE-OPERATIVE DIAGNOSIS:  RIGHT DISTAL RADIUS FRACTURE  POST-OPERATIVE DIAGNOSIS:  Right Distal Radius and Scaphoid Fracture  PROCEDURE:  Procedure(s): RIGHT OPEN REDUCTION INTERNAL FIXATION (ORIF) DISTAL RADIUS AND SCAPHOID ORIF (Right) RIGHT OPEN REDUCTION INTERNAL FIXATION (ORIF) SCAPHOID FRACTURE (Right) Closed treatment left radial head fracture  SURGEON:  Surgeon(s) and Role:    * Betha LoaKevin Advik Weatherspoon, MD - Primary    * Cindee SaltGary Jahnia Hewes, MD - Assisting  PHYSICIAN ASSISTANT:   ASSISTANTS: Cindee SaltGary Gemayel Mascio, MD   ANESTHESIA:   regional and general  EBL:  Total I/O In: 1800 [I.V.:1800] Out: 2 [Blood:2]  BLOOD ADMINISTERED:none  DRAINS: none   LOCAL MEDICATIONS USED:  NONE  SPECIMEN:  No Specimen  DISPOSITION OF SPECIMEN:  N/A  COUNTS:  YES  TOURNIQUET:   Total Tourniquet Time Documented: Upper Arm (Right) - 60 minutes Total: Upper Arm (Right) - 60 minutes   DICTATION: .Other Dictation: Dictation Number [id number]  PLAN OF CARE: Discharge to home after PACU  PATIENT DISPOSITION:  PACU - hemodynamically stable.

## 2015-05-16 NOTE — Transfer of Care (Signed)
Immediate Anesthesia Transfer of Care Note  Patient: [name]  Procedure(s) Performed: Procedure(s): RIGHT OPEN REDUCTION INTERNAL FIXATION (ORIF) DISTAL RADIUS AND SCAPHOID ORIF (Right) RIGHT OPEN REDUCTION INTERNAL FIXATION (ORIF) SCAPHOID FRACTURE (Right)  Patient Location: PACU  Anesthesia Type:GA combined with regional for post-op pain  Level of Consciousness: awake and patient cooperative  Airway & Oxygen Therapy: Patient Spontanous Breathing and Patient connected to face mask oxygen  Post-op Assessment: Report given to RN and Post -op Vital signs reviewed and stable  Post vital signs: Reviewed and stable  Last Vitals:  Filed Vitals:   05/16/15 1130 05/16/15 1135  BP:  111/68  Pulse: 57 67  Temp:    Resp: 12 12    Last Pain:  Filed Vitals:   05/16/15 1148  PainSc: 8       Patients Stated Pain Goal: 2 (05/16/15 1043)  Complications: No apparent anesthesia complications

## 2015-05-16 NOTE — Anesthesia Procedure Notes (Addendum)
Anesthesia Regional Block:  Infraclavicular brachial plexus block  Pre-Anesthetic Checklist: ,, timeout performed, Correct Patient, Correct Site, Correct Laterality, Correct Procedure, Correct Position, site marked, Risks and benefits discussed,  Surgical consent,  Pre-op evaluation,  At surgeon's request and post-op pain management  Laterality: Right and Upper  Prep: chloraprep       Needles:  Injection technique: Single-shot  Needle Type: Echogenic Stimulator Needle     Needle Length: 5cm 5 cm Needle Gauge: 21 and 21 G    Additional Needles:  Procedures: ultrasound guided (picture in chart) Infraclavicular brachial plexus block Narrative:  Start time: 05/16/2015 11:33 AM End time: 05/16/2015 11:38 AM Injection made incrementally with aspirations every 5 mL.  Performed by: Personally  Anesthesiologist: CREWS, DAVID   Procedure Name: LMA Insertion Date/Time: 05/16/2015 11:47 AM Performed by: Byhalia DesanctisLINKA, Elenore Wanninger L Pre-anesthesia Checklist: Patient identified, Emergency Drugs available, Suction available, Patient being monitored and Timeout performed Patient Re-evaluated:Patient Re-evaluated prior to inductionOxygen Delivery Method: Circle System Utilized Preoxygenation: Pre-oxygenation with 100% oxygen Intubation Type: IV induction Ventilation: Mask ventilation without difficulty LMA: LMA inserted LMA Size: 4.0 Number of attempts: 1 Airway Equipment and Method: Bite block Placement Confirmation: positive ETCO2 Tube secured with: Tape Dental Injury: Teeth and Oropharynx as per pre-operative assessment       Right Infraclavicular block image

## 2015-05-16 NOTE — Op Note (Signed)
Intra-operative fluoroscopic images in the AP, lateral, and oblique views were taken and evaluated by myself.  Reduction and hardware placement were confirmed.  There was no intraarticular penetration of permanent hardware.  

## 2015-05-17 NOTE — Op Note (Signed)
NAMESAPPHIRE, TYGART NO.:  [id number]  MEDICAL RECORD NO.:  [id number]  LOCATION:                                 FACILITY:  PHYSICIAN:  Betha Loa, MD        DATE OF BIRTH:  10/26/1970  DATE OF PROCEDURE:  05/16/2015 DATE OF DISCHARGE:                              OPERATIVE REPORT   PREOPERATIVE DIAGNOSIS:  Right scaphoid waist fracture and right comminuted intra-articular distal radius fracture with three intra- articular or more parts.  POSTOPERATIVE DIAGNOSIS:  Right scaphoid waist fracture and right comminuted intra-articular distal radius fracture with three intra- articular or more parts.  PROCEDURE:   1. Open reduction and internal fixation of right comminuted intra-articular distal radius fracture greater than three intra-articular fragments  2. Right brachioradialis release  3. Open reduction and internal fixation of right scaphoid waist fracture through separate incision 4. Closed treatment left radial head fracture without manipulation  SURGEON:  Betha Loa, MD.  ASSISTANT:  Cindee Salt, MD.  ANESTHESIA:  General with regional.  IV FLUIDS:  Per anesthesia flow sheet.  ESTIMATED BLOOD LOSS:  Minimal.  COMPLICATIONS:  None.  SPECIMENS:  None.  TOURNIQUET TIME:  60 minutes.  DISPOSITION:  Stable PACU.  INDICATIONS:  Ms. Allensworth is a 45 year old female who approximately states one-and-half weeks ago she fell injuring bilateral upper extremities.  She was seen at the emergency department, where radiographs were taken revealing a right comminuted intra-articular distal radius fracture with possible scaphoid fracture and subsequently a left radial head fracture with minimal displacement.  She was splinted and followed up in the office.  We discussed operative and nonoperative options.  She wished to have operative fixation of the right distal radius fracture and scaphoid fracture, nonoperative treatment to the radial head fracture on the  left side.  Risks, benefits, and alternatives of surgery were discussed including the risk of blood loss; infection; damage to nerves, vessels, tendons, ligaments, bone; failure of surgery; need for additional surgery; complications with wound healing; continued pain; nonunion; malunion; stiffness.  She voiced understanding of these risks and elected to proceed.  OPERATIVE COURSE:  After being identified preoperatively by myself, the patient and I agreed upon procedure and site of procedure.  Surgical site was marked.  The risks, benefits, and alternatives of surgery were reviewed and she wished to proceed.  Surgical consent had been signed. She was given IV Ancef as preoperative antibiotic prophylaxis.  She was transferred to the operating room, placed on operating room table in a supine position with the right upper extremity on arm board.  General anesthesia was induced by Anesthesiology.  A regional block had been performed by Anesthesia in the preoperative holding.  The C-arm was used in AP and lateral projections to localize the scaphoid.  Tourniquet at the proximal aspect of the extremity was inflated to 250 mmHg after exsanguination of the limb with an Esmarch bandage.  A guide pin from the Acumed Acutrak screw set was used and advanced into the scaphoid from the distal pole.  C-arm was used to confirm appropriate placement. The skin was incised sharply with a knife and the soft tissue spread down to  the bone.  The measuring device was used.  It was felt that a 20 mm screw would be appropriate length.  The opening drill bit was used to open the proximal cortex.  The 20 mm Acutrak screw was then placed into the scaphoid from the distal pole.  This was in good position.  Good purchase was obtained.  C-arm was used in AP, lateral, and oblique projections to ensure appropriate reduction and positioning of hardware which was the case.  The guide pin was removed.  Attention was turned  to the distal wrist.  A separate incision was made.  A volar Sherilyn CooterHenry approach was used.  The bipolar electrocautery was used to obtain hemostasis. The superficial and deep portions of the FCR tendon sheath were incised. The FCR and FPL were swept ulnarly to protect the palmar cutaneous branch of the median nerve which was visualized and protected throughout the case.  The brachioradialis was released at the radial side of the radius.  The pronator quadratus was incised and elevated with a periosteal elevator.  The fracture site was easily identified.  There was comminution.  There was intra-articular extension in two locations. The scaphoid fragment was separate.  The fracture was reduced under direct visualization.  A volar distal radial locking plate from the Acumed set was selected and secured to the bone with the guide pins. The C-arm was used in AP, lateral, and oblique projections to ensure for appropriate reduction and positioning of hardware which was the case. Standard AO drilling and measuring technique was used.  A single screw was placed in the slotted hole in the shaft of plate.  The distal holes were filled with locking pegs with the exception of the styloid holes which were filled with locking screws.  Remaining two holes in the shaft of the plate were filled with nonlocking screws.  Good purchase was obtained in all screws.  C-arm was used in AP, lateral, and oblique projections to ensure for appropriate reduction and positioning of hardware which was the case.  There was no intra-articular penetration. Radial length inclination and volar tilt had been restored.  The wound was copiously irrigated with sterile saline.  The pronator quadratus was repaired back over top of the plate using 4-0 Vicryl suture.  Two inverted interrupted Vicryl sutures were placed in the subcutaneous tissues, and the skin was closed with 4-0 nylon in a horizontal mattress fashion.  The wounds were  dressed with sterile Xeroform, 4x4s, and wrapped with a Kerlix bandage.  A thumb spica splint was placed and wrapped with a Kerlix and Ace bandage.  The tourniquet was deflated at 60 minutes.  Fingertips were pink with brisk capillary refill after deflation of the tourniquet.  Operative drapes were broken down.  The patient was awoken from anesthesia safely.  She was transferred back to a stretcher and taken to PACU in stable condition.  She also has a left radial head fracture with less than 1 mm displacement which she has chosen to treat non operatively.  No manipulation was performed to this fracture.  I will see her back in the office in one week for postoperative followup.  I will give her Percocet 10/325, 1-2 p.o. q.6 hours p.r.n. pain, dispensed #30.  She was written a new prescription for this a couple of days ago but states she did not pick it up.     Betha LoaKevin May Manrique, MD     KK/MEDQ  D:  05/16/2015  T:  05/16/2015  Job:  [id number] 

## 2015-05-19 ENCOUNTER — Encounter (HOSPITAL_BASED_OUTPATIENT_CLINIC_OR_DEPARTMENT_OTHER): Payer: Self-pay | Admitting: Orthopedic Surgery

## 2015-05-22 DIAGNOSIS — S52125D Nondisplaced fracture of head of left radius, subsequent encounter for closed fracture with routine healing: Secondary | ICD-10-CM | POA: Insufficient documentation

## 2015-05-22 DIAGNOSIS — S52501D Unspecified fracture of the lower end of right radius, subsequent encounter for closed fracture with routine healing: Secondary | ICD-10-CM | POA: Insufficient documentation

## 2015-07-21 ENCOUNTER — Emergency Department (HOSPITAL_COMMUNITY): Payer: Self-pay

## 2015-07-21 ENCOUNTER — Emergency Department (HOSPITAL_COMMUNITY)
Admission: EM | Admit: 2015-07-21 | Discharge: 2015-07-21 | Disposition: A | Discharge status: Home / Self Care | Payer: Self-pay | Attending: Emergency Medicine | Admitting: Emergency Medicine

## 2015-07-21 ENCOUNTER — Encounter (HOSPITAL_COMMUNITY): Payer: Self-pay | Admitting: Emergency Medicine

## 2015-07-21 DIAGNOSIS — E119 Type 2 diabetes mellitus without complications: Secondary | ICD-10-CM | POA: Insufficient documentation

## 2015-07-21 DIAGNOSIS — M545 Low back pain, unspecified: Secondary | ICD-10-CM

## 2015-07-21 DIAGNOSIS — F329 Major depressive disorder, single episode, unspecified: Secondary | ICD-10-CM | POA: Insufficient documentation

## 2015-07-21 DIAGNOSIS — F17210 Nicotine dependence, cigarettes, uncomplicated: Secondary | ICD-10-CM | POA: Insufficient documentation

## 2015-07-21 DIAGNOSIS — Z7984 Long term (current) use of oral hypoglycemic drugs: Secondary | ICD-10-CM | POA: Insufficient documentation

## 2015-07-21 MED ORDER — METHYLPREDNISOLONE SODIUM SUCC 40 MG IJ SOLR
125.0000 mg | Freq: Once | INTRAMUSCULAR | Status: DC
  Filled 2015-07-21: qty 4

## 2015-07-21 MED ORDER — METHYLPREDNISOLONE SODIUM SUCC 125 MG IJ SOLR
INTRAMUSCULAR | Status: AC
  Administered 2015-07-21: 125 mg via INTRAMUSCULAR
  Filled 2015-07-21: qty 2

## 2015-07-21 MED ORDER — METHYLPREDNISOLONE SODIUM SUCC 125 MG IJ SOLR
125.0000 mg | Freq: Once | INTRAMUSCULAR | Status: AC
Start: 2015-07-21 — End: 2015-07-21
  Administered 2015-07-21: 125 mg via INTRAMUSCULAR

## 2015-07-21 MED ORDER — METHYLPREDNISOLONE SODIUM SUCC 125 MG IJ SOLR: 125.0000 mg | Freq: Once | INTRAMUSCULAR | Status: DC

## 2015-07-21 MED ORDER — HYDROMORPHONE HCL 1 MG/ML IJ SOLN
1.0000 mg | Freq: Once | INTRAMUSCULAR | Status: AC
  Administered 2015-07-21: 1 mg via INTRAMUSCULAR
  Filled 2015-07-21: qty 1

## 2015-07-21 MED ORDER — KETOROLAC TROMETHAMINE 60 MG/2ML IM SOLN
60.0000 mg | Freq: Once | INTRAMUSCULAR | Status: DC
  Filled 2015-07-21: qty 2

## 2015-07-21 MED ORDER — METHOCARBAMOL 500 MG PO TABS: 500.0000 mg | ORAL_TABLET | Freq: Four times a day (QID) | ORAL | Status: DC

## 2015-07-21 MED ORDER — KETOROLAC TROMETHAMINE 60 MG/2ML IM SOLN: 60.0000 mg | Freq: Once | INTRAMUSCULAR | Status: DC

## 2015-07-21 MED ORDER — OXYCODONE-ACETAMINOPHEN 5-325 MG PO TABS
2.0000 | ORAL_TABLET | Freq: Once | ORAL | Status: DC
  Filled 2015-07-21: qty 2

## 2015-07-21 NOTE — Discharge Instructions (Signed)

## 2015-07-21 NOTE — ED Notes (Signed)
Patient c/o lower back pain. Per patient bent over Thursday when back pain started and has progressively gotten worse. CNS intact. Denies any complications with urination or BM.

## 2015-07-21 NOTE — ED Provider Notes (Signed)
CSN: [id number]     Arrival date & time 07/21/15  1210 History  By signing my name below, I, Tanda Rockers, attest that this documentation has been prepared under the direction and in the presence of Langston Masker, [place].  Electronically Signed: Tanda Rockers, ED Scribe. 07/21/2015. 2:09 PM.   Chief Complaint  Patient presents with  . Back Pain   Patient is a 45 y.o. female presenting with back pain. The history is provided by the patient. No language interpreter was used.  Back Pain Location:  Lumbar spine Radiates to:  Does not radiate Pain severity:  Severe Pain is:  Same all the time Onset quality:  Sudden Duration:  3 days Timing:  Constant Progression:  Worsening Chronicity:  New Context: not recent injury   Context comment:  + bending down Relieved by:  Nothing Worsened by:  Movement Ineffective treatments:  Narcotics Associated symptoms: no bladder incontinence, no bowel incontinence, no numbness, no perianal numbness, no tingling and no weakness     HPI Comments: Julie Burnett is a 45 y.o. female with PMHx DM and HLD, who presents to the Emergency Department complaining of sudden onset, constant, severe, worsening, midline lower back pain x 3 days. Pt states that she bent down 3 days ago and began having immediate pain to her back. No known injury to the back. She has had back pain in the past but reports it has never been this severe. Pt has been taking 5 mg Roxicodone prescribed by Dr. Janna Arch which she states is not giving her any relief. Denies weakness, numbness, tingling, urinary or bowel incontinence, saddle anesthesia, or any other associated symptoms.   Past Medical History  Diagnosis Date  . Anxiety   . Bipolar 1 disorder (HCC)   . Depression   . Liver disease   . Diabetes mellitus   . High cholesterol   . H/O suicide attempt   . Alcohol dependence (HCC)   . Polysubstance abuse    Past Surgical History  Procedure Laterality Date  . No past surgeries    .  Open reduction internal fixation (orif) distal radial fracture Right 05/16/2015    Procedure: RIGHT OPEN REDUCTION INTERNAL FIXATION (ORIF) DISTAL RADIUS AND SCAPHOID ORIF;  Surgeon: Betha Loa, MD;  Location: [facility];  Service: Orthopedics;  Laterality: Right;  . Orif scaphoid fracture Right 05/16/2015    Procedure: RIGHT OPEN REDUCTION INTERNAL FIXATION (ORIF) SCAPHOID FRACTURE;  Surgeon: Betha Loa, MD;  Location:  SURGERY CENTER;  Service: Orthopedics;  Laterality: Right;   History reviewed. No pertinent family history. Social History  Substance Use Topics  . Smoking status: Current Every Day Smoker -- 1.50 packs/day for 17 years    Types: Cigarettes  . Smokeless tobacco: Never Used  . Alcohol Use: 3.6 oz/week    6 Cans of beer per week     Comment: 6-12 pack daily; usually 12 oz   OB History    No data available     Review of Systems  Gastrointestinal: Negative for bowel incontinence.  Genitourinary: Negative for bladder incontinence.  Musculoskeletal: Positive for back pain.  Neurological: Negative for tingling, weakness and numbness.  All other systems reviewed and are negative.  Allergies  Codeine  Home Medications   Prior to Admission medications   Medication Sig Start Date End Date Taking? Authorizing Provider  amphetamine-dextroamphetamine (ADDERALL) 20 MG tablet Take 20 mg by mouth daily.    Historical Provider, MD  LORazepam (ATIVAN) 1 MG tablet Take 1  mg by mouth 2 (two) times daily.    Historical Provider, MD  metFORMIN (GLUCOPHAGE) 500 MG tablet Take 1 tablet (500 mg total) by mouth 2 (two) times daily with a meal. 03/09/15   Oneta Rackanika N Lewis, NP  oxyCODONE-acetaminophen (PERCOCET) 10-325 MG tablet 1-2 tabs po q6 hours prn pain 05/16/15   Betha LoaKevin Kuzma, MD  pregabalin (LYRICA) 75 MG capsule Take 75 mg by mouth 2 (two) times daily.    Historical Provider, MD   BP 104/64 mmHg  Pulse 86  Temp(Src) 98.4 F (36.9 C) (Oral)  Resp 18  Ht 5\' 5"   (1.651 m)  Wt 160 lb (72.576 kg)  BMI 26.63 kg/m2  SpO2 99%  LMP 06/27/2015   Physical Exam  Constitutional: She is oriented to person, place, and time. She appears well-developed and well-nourished. No distress.  HENT:  Head: Normocephalic and atraumatic.  Eyes: Conjunctivae and EOM are normal.  Neck: Neck supple. No tracheal deviation present.  Cardiovascular: Normal rate.   Pulmonary/Chest: Effort normal. No respiratory distress.  Musculoskeletal: Normal range of motion. She exhibits tenderness.  Diffuse low back tenderness  Neurological: She is alert and oriented to person, place, and time.  Skin: Skin is warm and dry.  Psychiatric: She has a normal mood and affect. Her behavior is normal.  Nursing note and vitals reviewed.   ED Course  Procedures (including critical care time)  DIAGNOSTIC STUDIES: Oxygen Saturation is 99% on RA, normal by my interpretation.    COORDINATION OF CARE: 2:02 PM-Discussed treatment plan which includes Toradol injection and DG L Spine with pt at bedside and pt agreed to plan.   Labs Review Labs Reviewed - No data to display  Imaging Review No results found. I have personally reviewed and evaluated these images and lab results as part of my medical decision-making.   EKG Interpretation None      MDM Pt declined xrays.  Pt reports some relief with pain medication   Final diagnoses:  Midline low back pain without sciatica    Meds ordered this encounter  Medications  . DISCONTD: ketorolac (TORADOL) injection 60 mg    Sig:   . DISCONTD: methylPREDNISolone sodium succinate (SOLU-MEDROL) 40 mg/mL injection 125 mg    Sig:   . DISCONTD: oxyCODONE-acetaminophen (PERCOCET/ROXICET) 5-325 MG per tablet 2 tablet    Sig:   . methylPREDNISolone sodium succinate (SOLU-MEDROL) 125 mg/2 mL injection    Sig:     Earl Galasborne, Tiffany   : cabinet override  . DISCONTD: methylPREDNISolone sodium succinate (SOLU-MEDROL) 125 mg/2 mL injection 125 mg     Sig:   . HYDROmorphone (DILAUDID) injection 1 mg    Sig:   . DISCONTD: ketorolac (TORADOL) injection 60 mg    Sig:   . methylPREDNISolone sodium succinate (SOLU-MEDROL) 125 mg/2 mL injection 125 mg    Sig:   . methocarbamol (ROBAXIN) 500 MG tablet    Sig: Take 1 tablet (500 mg total) by mouth 4 (four) times daily.    Dispense:  20 tablet    Refill:  0    Order Specific Question:  Supervising Provider    Answer:  Eber HongMILLER, BRIAN [[id number]]  An After Visit Summary was printed and given to the patient.     Lonia SkinnerLeslie K MalvernSofia, PA-C 07/21/15 1619  Donnetta HutchingBrian Cook, MD 07/24/15 [phone]

## 2015-07-21 NOTE — ED Notes (Addendum)
Pt refusing xray at this time. PA notified. Urine preg not done due to pt refusing xray.

## 2015-08-31 ENCOUNTER — Emergency Department (HOSPITAL_COMMUNITY)
Admission: EM | Admit: 2015-08-31 | Discharge: 2015-08-31 | Disposition: A | Discharge status: Home / Self Care | Payer: Self-pay | Attending: Emergency Medicine | Admitting: Emergency Medicine

## 2015-08-31 ENCOUNTER — Encounter (HOSPITAL_COMMUNITY): Payer: Self-pay | Admitting: *Deleted

## 2015-08-31 ENCOUNTER — Emergency Department (HOSPITAL_COMMUNITY): Payer: Self-pay

## 2015-08-31 DIAGNOSIS — S62604A Fracture of unspecified phalanx of right ring finger, initial encounter for closed fracture: Secondary | ICD-10-CM

## 2015-08-31 DIAGNOSIS — W01198A Fall on same level from slipping, tripping and stumbling with subsequent striking against other object, initial encounter: Secondary | ICD-10-CM | POA: Insufficient documentation

## 2015-08-31 DIAGNOSIS — S62612A Displaced fracture of proximal phalanx of right middle finger, initial encounter for closed fracture: Secondary | ICD-10-CM | POA: Insufficient documentation

## 2015-08-31 DIAGNOSIS — S62614A Displaced fracture of proximal phalanx of right ring finger, initial encounter for closed fracture: Secondary | ICD-10-CM | POA: Insufficient documentation

## 2015-08-31 DIAGNOSIS — E119 Type 2 diabetes mellitus without complications: Secondary | ICD-10-CM | POA: Insufficient documentation

## 2015-08-31 DIAGNOSIS — Y929 Unspecified place or not applicable: Secondary | ICD-10-CM | POA: Insufficient documentation

## 2015-08-31 DIAGNOSIS — S62610A Displaced fracture of proximal phalanx of right index finger, initial encounter for closed fracture: Secondary | ICD-10-CM | POA: Insufficient documentation

## 2015-08-31 DIAGNOSIS — F17210 Nicotine dependence, cigarettes, uncomplicated: Secondary | ICD-10-CM | POA: Insufficient documentation

## 2015-08-31 DIAGNOSIS — Y9302 Activity, running: Secondary | ICD-10-CM | POA: Insufficient documentation

## 2015-08-31 DIAGNOSIS — Y999 Unspecified external cause status: Secondary | ICD-10-CM | POA: Insufficient documentation

## 2015-08-31 MED ORDER — HYDROMORPHONE HCL 1 MG/ML IJ SOLN
1.0000 mg | INTRAMUSCULAR | Status: DC | PRN
  Administered 2015-08-31 (×2): 1 mg via INTRAVENOUS
  Filled 2015-08-31 (×2): qty 1

## 2015-08-31 MED ORDER — LIDOCAINE HCL (PF) 1 % IJ SOLN
INTRAMUSCULAR | Status: AC
  Filled 2015-08-31: qty 5

## 2015-08-31 MED ORDER — OXYCODONE-ACETAMINOPHEN 10-325 MG PO TABS: 1.0000 | ORAL_TABLET | ORAL | 0 refills | Status: DC | PRN

## 2015-08-31 MED ORDER — LIDOCAINE HCL (PF) 1 % IJ SOLN
INTRAMUSCULAR | Status: AC
  Filled 2015-08-31: qty 10

## 2015-08-31 MED ORDER — LIDOCAINE HCL (PF) 1 % IJ SOLN
30.0000 mL | Freq: Once | INTRAMUSCULAR | Status: DC
  Filled 2015-08-31: qty 30

## 2015-08-31 MED ORDER — HYDROMORPHONE HCL 1 MG/ML IJ SOLN
1.0000 mg | Freq: Once | INTRAMUSCULAR | Status: AC
  Administered 2015-08-31: 1 mg via INTRAVENOUS
  Filled 2015-08-31: qty 1

## 2015-08-31 MED ORDER — OXYCODONE-ACETAMINOPHEN 5-325 MG PO TABS
2.0000 | ORAL_TABLET | Freq: Once | ORAL | Status: AC
  Administered 2015-08-31: 2 via ORAL
  Filled 2015-08-31: qty 2

## 2015-08-31 MED ORDER — FENTANYL CITRATE (PF) 100 MCG/2ML IJ SOLN
150.0000 ug | Freq: Once | INTRAMUSCULAR | Status: AC
  Administered 2015-08-31: 150 ug via INTRAVENOUS
  Filled 2015-08-31: qty 4

## 2015-08-31 MED ORDER — SODIUM CHLORIDE 0.9 % IV BOLUS (SEPSIS)
1000.0000 mL | Freq: Once | INTRAVENOUS | Status: AC
  Administered 2015-08-31: 1000 mL via INTRAVENOUS

## 2015-08-31 NOTE — ED Notes (Signed)
Pt reports pain was down to 4 now a 9- physician in to reduce fractures with lidocain and verbal order for dilaudid

## 2015-08-31 NOTE — ED Notes (Signed)
Cigarette found on floor next to stretcher after report of cigarette smell of smoke in the bathroom. Pt denies having cigarette after eduction regarding cigarette and O2 combustibility- Friend at bedside smells of cigarettes. Cigarette in sharps box with reminded of boom should lit cigarette and O2 meet

## 2015-08-31 NOTE — ED Triage Notes (Signed)
Pt states she was running and fell. She has abrasions noted to her chin, right chest, right leg, and feet. She has a notable deformity to her right hand middle finger.

## 2015-08-31 NOTE — ED Notes (Signed)
Returned from radiology. 

## 2015-08-31 NOTE — ED Notes (Signed)
Pt with many questions regarding dilaudid verses fent. She reports that dilaudid is a good medicin for her pain and is conversant thruout the administration of the fent 150 mcg. She reports a high tolerance for pain and her previous fractures.

## 2015-08-31 NOTE — ED Notes (Signed)
Pt standing in doorway, arm hanging down conversant with friends and drinking pos. She reports increased pain and desire to [place] with EDP- She is encouraged to elevate her extremity higher than her heart. EDP is asked to speak with the patient

## 2015-08-31 NOTE — ED Provider Notes (Signed)
[facility]Y DEPT Provider Note   CSN: [id number] Arrival date & time: 08/31/15  1521     History   Chief Complaint Chief Complaint  Patient presents with  . Fall    HPI Julie Burnett is a 45 y.o. female presenting after a trip and fall. Patient states she was running chasing after someone and then she tripped on her sandals and fell onto her hand. She has an obvious right middle finger deformity. Also having pain in her hand but no pain in her wrist. A couple months ago had a right wrist surgery by Dr. Merlyn Lot. Patient's also complaining of mild her pain on her chin where she suffered an abrasion as well as over her right breast. However these pains are mild. No trouble breathing. No headache although she has been dizzy since the fall. Did not hit her head. No malocclusion or jaw pain. She states there is a little numbness on both her right index and middle finger.  HPI  Past Medical History:  Diagnosis Date  . Alcohol dependence (HCC)   . Anxiety   . Bipolar 1 disorder (HCC)   . Depression   . Diabetes mellitus   . H/O suicide attempt   . High cholesterol   . Liver disease   . Polysubstance abuse     Patient Active Problem List   Diagnosis Date Noted  . MDD (major depressive disorder) (HCC) 03/05/2015  . Major depressive disorder, recurrent episode, severe, without mention of psychotic behavior 07/25/2012  . Alcohol withdrawal (HCC) 07/25/2012  . Alcohol dependence (HCC) 07/25/2012  . Opioid dependence (HCC) 11/12/2011  . Polysubstance dependence (HCC) 11/12/2011  . Diabetes mellitus (HCC) 11/12/2011    Past Surgical History:  Procedure Laterality Date  . NO PAST SURGERIES    . OPEN REDUCTION INTERNAL FIXATION (ORIF) DISTAL RADIAL FRACTURE Right 05/16/2015   Procedure: RIGHT OPEN REDUCTION INTERNAL FIXATION (ORIF) DISTAL RADIUS AND SCAPHOID ORIF;  Surgeon: Betha Loa, MD;  Location: [facility];  Service: Orthopedics;  Laterality: Right;  . ORIF  SCAPHOID FRACTURE Right 05/16/2015   Procedure: RIGHT OPEN REDUCTION INTERNAL FIXATION (ORIF) SCAPHOID FRACTURE;  Surgeon: Betha Loa, MD;  Location: [facility];  Service: Orthopedics;  Laterality: Right;    OB History    No data available       Home Medications    Prior to Admission medications   Medication Sig Start Date End Date Taking? Authorizing Provider  amphetamine-dextroamphetamine (ADDERALL) 20 MG tablet Take 20 mg by mouth daily.   Yes Historical Provider, MD  pregabalin (LYRICA) 75 MG capsule Take 75 mg by mouth 2 (two) times daily.   Yes Historical Provider, MD  oxyCODONE-acetaminophen (PERCOCET) 10-325 MG tablet Take 1 tablet by mouth every 4 (four) hours as needed for pain. 08/31/15   Pricilla Loveless, MD    Family History No family history on file.  Social History Social History  Substance Use Topics  . Smoking status: Current Every Day Smoker    Packs/day: 1.50    Years: 17.00    Types: Cigarettes  . Smokeless tobacco: Never Used  . Alcohol use 3.6 oz/week    6 Cans of beer per week     Comment: 6-12 pack daily; usually 12 oz     Allergies   Review of patient's allergies indicates no active allergies.   Review of Systems Review of Systems  Musculoskeletal: Positive for arthralgias and joint swelling.  Skin: Positive for wound (abrasion).  Neurological: Positive for dizziness  and numbness.     Physical Exam Updated Vital Signs BP 154/81 (BP Location: Left Arm)   Pulse 102   Temp 97.9 F (36.6 C) (Oral)   Resp 18   Ht 5\' 4"  (1.626 m)   Wt 160 lb (72.6 kg)   LMP 08/17/2015   SpO2 98%   BMI 27.46 kg/m   Physical Exam  Constitutional: She is oriented to person, place, and time. She appears well-developed and well-nourished.  HENT:  Head: Normocephalic.    Right Ear: External ear normal.  Left Ear: External ear normal.  Nose: Nose normal.  Eyes: Right eye exhibits no discharge. Left eye exhibits no discharge.    Cardiovascular: Normal rate, regular rhythm and normal heart sounds.   Pulses:      Radial pulses are 2+ on the right side.  Pulmonary/Chest: Effort normal and breath sounds normal. No respiratory distress. She exhibits no tenderness.    Abdominal: Soft. There is no tenderness.  Musculoskeletal:       Right wrist: She exhibits normal range of motion and no tenderness.       Hands: Neurological: She is alert and oriented to person, place, and time.  Skin: Skin is warm and dry.  Nursing note and vitals reviewed.    ED Treatments / Results  Labs (all labs ordered are listed, but only abnormal results are displayed) Labs Reviewed - No data to display  EKG  EKG Interpretation None       Radiology Dg Hand Complete Right  Result Date: 08/31/2015 CLINICAL DATA:  Postreduction EXAM: RIGHT HAND - COMPLETE 3+ VIEW COMPARISON:  08/31/2015 FINDINGS: Three views of the right hand submitted. Postreduction there is improvement in alignment of comminuted fracture at the base of proximal phalanx second and third finger. Improvement in alignment of the fracture at the base of middle phalanx third finger. Again noted postsurgical changes distal radius. IMPRESSION: Postreduction there is improvement in alignment of comminuted fractures at the base of proximal phalanx second and third finger. Improvement in alignment of the fracture at the base of middle phalanx third finger. Electronically Signed   By: Natasha MeadLiviu  Pop M.D.   On: 08/31/2015 17:42   Dg Hand Complete Right  Result Date: 08/31/2015 CLINICAL DATA:  Pain following fall EXAM: RIGHT HAND - COMPLETE 3+ VIEW COMPARISON:  CT right wrist May 14, 2015 FINDINGS: Frontal, oblique, and lateral views were obtained. There are comminuted fractures of the proximal aspects of the second and third proximal phalanges ease with lateral angulation and medial displacement of the major distal fracture fragments with respect proximal fragments. Fracture of the  lateral aspect of the proximal portion of the third proximal phalanx extends into the third MCP joint region. There is also a fracture along the volar aspect of the proximal portion of the fourth middle phalanx with mild volar displacement of the avulsed fragment. No other fractures are evident. No dislocations. Postoperative change is noted in the distal radius and scaphoid with screw and plate fixation in these respective areas. IMPRESSION: Comminuted fractures of the proximal aspects of the second and third proximal phalanges. There is displacement and angulation as noted above. Note that third proximal phalanx fracture extends into the third MCP joint along the lateral aspect. There is also a fracture of the volar aspect of the proximal portion of the fourth middle phalanx with mild displacement of the avulsed fragment. Postoperative change in the distal radius and scaphoid noted. Alignment of prior fractures in these areas is anatomic. Electronically  Signed   By: Bretta BangWilliam  Woodruff III M.D.   On: 08/31/2015 16:28    Procedures .Nerve Block Date/Time: 08/31/2015 5:04 PM Performed by: Pricilla LovelessGOLDSTON, Hayzlee Mcsorley Authorized by: Pricilla LovelessGOLDSTON, Tiasia Weberg   Consent:    Consent obtained:  Verbal   Consent given by:  Patient Indications:    Indications:  Pain relief and procedural anesthesia Location:    Body area:  Upper extremity   Upper extremity nerve blocked: 2nd and 3rd digit.   Laterality:  Right Pre-procedure details:    Skin preparation:  Alcohol   Preparation: Patient was prepped and draped in usual sterile fashion   Skin anesthesia (see MAR for exact dosages):    Skin anesthesia method:  None Procedure details (see MAR for exact dosages):    Block needle gauge:  25 G   Anesthetic injected:  Lidocaine 1% w/o epi   Steroid injected:  None   Additive injected:  None   Injection procedure:  Anatomic landmarks identified   Paresthesia:  None Post-procedure details:    Dressing:  None   Patient tolerance  of procedure:  Tolerated well, no immediate complications  Reduction of fracture Date/Time: 08/31/2015 5:12 PM Performed by: Pricilla LovelessGOLDSTON, Amey Hossain Authorized by: Pricilla LovelessGOLDSTON, [place]  Consent: Verbal consent obtained. Risks and benefits: risks, benefits and alternatives were discussed Consent given by: patient Patient understanding: patient states understanding of the procedure being performed Patient identity confirmed: verbally with patient Preparation: Patient was prepped and draped in the usual sterile fashion. Local anesthesia used: yes Anesthesia: nerve block  Anesthesia: Local anesthesia used: yes  Sedation: Patient sedated: no Patient tolerance: Patient tolerated the procedure well with no immediate complications Comments: Obvious reduction of middle finger, a little better alignment of index finger.    (including critical care time)  Medications Ordered in ED Medications  HYDROmorphone (DILAUDID) injection 1 mg (1 mg Intravenous Given 08/31/15 1654)  lidocaine (PF) (XYLOCAINE) 1 % injection 30 mL (30 mLs Other Handoff 08/31/15 1809)  lidocaine (PF) (XYLOCAINE) 1 % injection (  Handoff 08/31/15 1809)  sodium chloride 0.9 % bolus 1,000 mL (0 mLs Intravenous Stopped 08/31/15 1802)  fentaNYL (SUBLIMAZE) injection 150 mcg (150 mcg Intravenous Given 08/31/15 1559)  HYDROmorphone (DILAUDID) injection 1 mg (1 mg Intravenous Given 08/31/15 1803)  oxyCODONE-acetaminophen (PERCOCET/ROXICET) 5-325 MG per tablet 2 tablet (2 tablets Oral Given 08/31/15 1805)     Initial Impression / Assessment and Plan / ED Course  I have reviewed the triage vital signs and the nursing notes.  Pertinent labs & imaging results that were available during my care of the patient were reviewed by me and considered in my medical decision making (see chart for details).  Clinical Course  Comment By Time  Right ring finger with obvious deformity. Will give IV fentanyl, keep NPO. No headache or signs of head injury. Mild  abrasions in other locations, she declines CT face or other xrays besides hand Pricilla LovelessScott Lindley Hiney, MD 08/19 1541  D/w Dr. Merlyn LotKuzma. Recommends aligning fingers with reduction and splint hand/wrist. F/u in his office early next week. Will call back if there are any complications. Pricilla LovelessScott Nayquan Evinger, MD 08/19 1635  Alignment is much better after reduction. Patient will be kept in splint, given oral pain control as well as 1 more dose of IV pain control here. Follow-up with Dr. Merlyn LotKuzma call him in 2 days (Monday) for follow-up in the next couple days. Currently has normal cap refill. Discussed return precautions including numbness or color changes to her hand. Pricilla LovelessScott Kamron Portee, MD 08/19  1753    Final Clinical Impressions(s) / ED Diagnoses   Final diagnoses:  Closed displaced fracture of proximal phalanx of right middle finger, initial encounter  Closed displaced fracture of proximal phalanx of right index finger, initial encounter  Closed fracture of phalanx of right ring finger, initial encounter    New Prescriptions Discharge Medication List as of 08/31/2015  5:59 PM    START taking these medications   Details  oxyCODONE-acetaminophen (PERCOCET) 10-325 MG tablet Take 1 tablet by mouth every 4 (four) hours as needed for pain., Starting Sat 08/31/2015, Print         Pricilla Loveless, MD 08/31/15 2042

## 2015-09-06 ENCOUNTER — Other Ambulatory Visit (HOSPITAL_COMMUNITY): Payer: Self-pay | Admitting: Orthopedic Surgery

## 2015-09-06 DIAGNOSIS — S62624A Displaced fracture of medial phalanx of right ring finger, initial encounter for closed fracture: Principal | ICD-10-CM

## 2015-09-06 DIAGNOSIS — IMO0001 Reserved for inherently not codable concepts without codable children: Secondary | ICD-10-CM

## 2015-09-10 ENCOUNTER — Ambulatory Visit (HOSPITAL_COMMUNITY)
Admission: RE | Admit: 2015-09-10 | Discharge: 2015-09-10 | Disposition: A | Discharge status: Home / Self Care | Payer: Self-pay | Source: Ambulatory Visit | Attending: Orthopedic Surgery | Admitting: Orthopedic Surgery

## 2015-09-10 DIAGNOSIS — IMO0001 Reserved for inherently not codable concepts without codable children: Secondary | ICD-10-CM

## 2015-09-10 DIAGNOSIS — S62624A Displaced fracture of medial phalanx of right ring finger, initial encounter for closed fracture: Principal | ICD-10-CM

## 2015-09-11 ENCOUNTER — Ambulatory Visit (HOSPITAL_COMMUNITY): Payer: Self-pay

## 2015-09-12 ENCOUNTER — Ambulatory Visit (HOSPITAL_COMMUNITY)
Admission: RE | Admit: 2015-09-12 | Discharge: 2015-09-12 | Disposition: A | Discharge status: Home / Self Care | Payer: Self-pay | Source: Ambulatory Visit | Attending: Orthopedic Surgery | Admitting: Orthopedic Surgery

## 2015-09-12 DIAGNOSIS — S62624D Displaced fracture of medial phalanx of right ring finger, subsequent encounter for fracture with routine healing: Secondary | ICD-10-CM | POA: Insufficient documentation

## 2015-09-12 DIAGNOSIS — S62612D Displaced fracture of proximal phalanx of right middle finger, subsequent encounter for fracture with routine healing: Secondary | ICD-10-CM | POA: Insufficient documentation

## 2015-09-12 DIAGNOSIS — X58XXXD Exposure to other specified factors, subsequent encounter: Secondary | ICD-10-CM | POA: Insufficient documentation

## 2015-09-12 DIAGNOSIS — S62610D Displaced fracture of proximal phalanx of right index finger, subsequent encounter for fracture with routine healing: Secondary | ICD-10-CM | POA: Insufficient documentation

## 2015-09-13 ENCOUNTER — Encounter (HOSPITAL_BASED_OUTPATIENT_CLINIC_OR_DEPARTMENT_OTHER): Payer: Self-pay | Admitting: *Deleted

## 2015-09-13 ENCOUNTER — Other Ambulatory Visit: Payer: Self-pay | Admitting: Orthopedic Surgery

## 2015-09-17 ENCOUNTER — Encounter (HOSPITAL_BASED_OUTPATIENT_CLINIC_OR_DEPARTMENT_OTHER)
Admission: RE | Disposition: A | Discharge status: Home / Self Care | Payer: Self-pay | Source: Ambulatory Visit | Attending: Orthopedic Surgery

## 2015-09-17 ENCOUNTER — Ambulatory Visit (HOSPITAL_BASED_OUTPATIENT_CLINIC_OR_DEPARTMENT_OTHER): Payer: Self-pay | Admitting: Anesthesiology

## 2015-09-17 ENCOUNTER — Ambulatory Visit (HOSPITAL_BASED_OUTPATIENT_CLINIC_OR_DEPARTMENT_OTHER)
Admission: RE | Admit: 2015-09-17 | Discharge: 2015-09-17 | Disposition: A | Discharge status: Home / Self Care | Payer: Self-pay | Source: Ambulatory Visit | Attending: Orthopedic Surgery | Admitting: Orthopedic Surgery

## 2015-09-17 ENCOUNTER — Encounter (HOSPITAL_BASED_OUTPATIENT_CLINIC_OR_DEPARTMENT_OTHER): Payer: Self-pay

## 2015-09-17 DIAGNOSIS — S62610A Displaced fracture of proximal phalanx of right index finger, initial encounter for closed fracture: Secondary | ICD-10-CM | POA: Insufficient documentation

## 2015-09-17 DIAGNOSIS — S62614A Displaced fracture of proximal phalanx of right ring finger, initial encounter for closed fracture: Secondary | ICD-10-CM | POA: Insufficient documentation

## 2015-09-17 DIAGNOSIS — E119 Type 2 diabetes mellitus without complications: Secondary | ICD-10-CM | POA: Insufficient documentation

## 2015-09-17 DIAGNOSIS — F319 Bipolar disorder, unspecified: Secondary | ICD-10-CM | POA: Insufficient documentation

## 2015-09-17 DIAGNOSIS — W1830XA Fall on same level, unspecified, initial encounter: Secondary | ICD-10-CM | POA: Insufficient documentation

## 2015-09-17 DIAGNOSIS — F329 Major depressive disorder, single episode, unspecified: Secondary | ICD-10-CM | POA: Insufficient documentation

## 2015-09-17 DIAGNOSIS — Z7984 Long term (current) use of oral hypoglycemic drugs: Secondary | ICD-10-CM | POA: Insufficient documentation

## 2015-09-17 DIAGNOSIS — F17210 Nicotine dependence, cigarettes, uncomplicated: Secondary | ICD-10-CM | POA: Insufficient documentation

## 2015-09-17 DIAGNOSIS — S62612A Displaced fracture of proximal phalanx of right middle finger, initial encounter for closed fracture: Secondary | ICD-10-CM | POA: Insufficient documentation

## 2015-09-17 DIAGNOSIS — F419 Anxiety disorder, unspecified: Secondary | ICD-10-CM | POA: Insufficient documentation

## 2015-09-17 HISTORY — PX: CLOSED REDUCTION FINGER WITH PERCUTANEOUS PINNING: SHX5612

## 2015-09-17 HISTORY — PX: OPEN REDUCTION INTERNAL FIXATION (ORIF) METACARPAL: SHX6234

## 2015-09-17 LAB — POCT I-STAT, CHEM 8
CREATININE: 0.4 mg/dL — AB (ref 0.44–1.00)
Calcium, Ion: 1.15 mmol/L (ref 1.15–1.40)
Chloride: 102 mmol/L (ref 101–111)
Glucose, Bld: 142 mg/dL — ABNORMAL HIGH (ref 65–99)
HEMATOCRIT: 41 % (ref 36.0–46.0)
HEMOGLOBIN: 13.9 g/dL (ref 12.0–15.0)
POTASSIUM: 3.8 mmol/L (ref 3.5–5.1)
SODIUM: 140 mmol/L (ref 135–145)
TCO2: 23 mmol/L (ref 0–100)

## 2015-09-17 SURGERY — CLOSED REDUCTION, FINGER, WITH PERCUTANEOUS PINNING
Anesthesia: General | Site: Hand | Laterality: Right

## 2015-09-17 MED ORDER — MEPERIDINE HCL 25 MG/ML IJ SOLN: 6.2500 mg | INTRAMUSCULAR | Status: DC | PRN

## 2015-09-17 MED ORDER — ONDANSETRON HCL 4 MG/2ML IJ SOLN
INTRAMUSCULAR | Status: AC
  Filled 2015-09-17: qty 2

## 2015-09-17 MED ORDER — CHLORHEXIDINE GLUCONATE 4 % EX LIQD: 60.0000 mL | Freq: Once | CUTANEOUS | Status: DC

## 2015-09-17 MED ORDER — BUPIVACAINE-EPINEPHRINE (PF) 0.5% -1:200000 IJ SOLN
INTRAMUSCULAR | Status: DC | PRN
  Administered 2015-09-17: 25 mL via PERINEURAL

## 2015-09-17 MED ORDER — GLYCOPYRROLATE 0.2 MG/ML IJ SOLN: 0.2000 mg | Freq: Once | INTRAMUSCULAR | Status: DC | PRN

## 2015-09-17 MED ORDER — SCOPOLAMINE 1 MG/3DAYS TD PT72: 1.0000 | MEDICATED_PATCH | Freq: Once | TRANSDERMAL | Status: DC | PRN

## 2015-09-17 MED ORDER — PROPOFOL 10 MG/ML IV BOLUS
INTRAVENOUS | Status: DC | PRN
  Administered 2015-09-17: 200 mg via INTRAVENOUS

## 2015-09-17 MED ORDER — DEXAMETHASONE SODIUM PHOSPHATE 10 MG/ML IJ SOLN
INTRAMUSCULAR | Status: AC
  Filled 2015-09-17: qty 1

## 2015-09-17 MED ORDER — OXYCODONE HCL 5 MG/5ML PO SOLN: 5.0000 mg | Freq: Once | ORAL | Status: DC | PRN

## 2015-09-17 MED ORDER — MIDAZOLAM HCL 2 MG/2ML IJ SOLN
1.0000 mg | INTRAMUSCULAR | Status: DC | PRN
  Administered 2015-09-17: 2 mg via INTRAVENOUS

## 2015-09-17 MED ORDER — HYDROMORPHONE HCL 1 MG/ML IJ SOLN: 0.2500 mg | INTRAMUSCULAR | Status: DC | PRN

## 2015-09-17 MED ORDER — FENTANYL CITRATE (PF) 100 MCG/2ML IJ SOLN
INTRAMUSCULAR | Status: AC
  Filled 2015-09-17: qty 2

## 2015-09-17 MED ORDER — CEFAZOLIN SODIUM-DEXTROSE 2-4 GM/100ML-% IV SOLN
INTRAVENOUS | Status: AC
  Filled 2015-09-17: qty 100

## 2015-09-17 MED ORDER — LIDOCAINE 2% (20 MG/ML) 5 ML SYRINGE
INTRAMUSCULAR | Status: DC | PRN
  Administered 2015-09-17: 100 mg via INTRAVENOUS

## 2015-09-17 MED ORDER — PROPOFOL 10 MG/ML IV BOLUS
INTRAVENOUS | Status: AC
  Filled 2015-09-17: qty 20

## 2015-09-17 MED ORDER — ONDANSETRON HCL 4 MG/2ML IJ SOLN: 4.0000 mg | Freq: Once | INTRAMUSCULAR | Status: DC | PRN

## 2015-09-17 MED ORDER — MIDAZOLAM HCL 2 MG/2ML IJ SOLN
INTRAMUSCULAR | Status: AC
Start: 2015-09-17 — End: 2015-09-17
  Filled 2015-09-17: qty 2

## 2015-09-17 MED ORDER — DEXAMETHASONE SODIUM PHOSPHATE 10 MG/ML IJ SOLN
INTRAMUSCULAR | Status: DC | PRN
  Administered 2015-09-17: 10 mg via INTRAVENOUS

## 2015-09-17 MED ORDER — CEFAZOLIN SODIUM-DEXTROSE 2-4 GM/100ML-% IV SOLN
2.0000 g | INTRAVENOUS | Status: AC
  Administered 2015-09-17: 2 g via INTRAVENOUS

## 2015-09-17 MED ORDER — OXYCODONE HCL 5 MG PO TABS: 5.0000 mg | ORAL_TABLET | Freq: Once | ORAL | Status: DC | PRN

## 2015-09-17 MED ORDER — OXYCODONE-ACETAMINOPHEN 10-325 MG PO TABS: ORAL_TABLET | ORAL | 0 refills | Status: DC

## 2015-09-17 MED ORDER — MIDAZOLAM HCL 2 MG/2ML IJ SOLN
INTRAMUSCULAR | Status: AC
  Filled 2015-09-17: qty 2

## 2015-09-17 MED ORDER — ONDANSETRON HCL 4 MG/2ML IJ SOLN
INTRAMUSCULAR | Status: DC | PRN
Start: 2015-09-17 — End: 2015-09-17
  Administered 2015-09-17: 4 mg via INTRAVENOUS

## 2015-09-17 MED ORDER — FENTANYL CITRATE (PF) 100 MCG/2ML IJ SOLN
50.0000 ug | INTRAMUSCULAR | Status: DC | PRN
  Administered 2015-09-17: 100 ug via INTRAVENOUS

## 2015-09-17 MED ORDER — LACTATED RINGERS IV SOLN
INTRAVENOUS | Status: DC
  Administered 2015-09-17 (×2): via INTRAVENOUS

## 2015-09-17 SURGICAL SUPPLY — 53 items
BANDAGE ACE 3X5.8 VEL STRL LF (GAUZE/BANDAGES/DRESSINGS) ×3 IMPLANT
BLADE SURG 15 STRL LF DISP TIS (BLADE) ×2 IMPLANT
BLADE SURG 15 STRL SS (BLADE) ×6
BNDG CMPR 9X4 STRL LF SNTH (GAUZE/BANDAGES/DRESSINGS) ×1
BNDG ELASTIC 2X5.8 VLCR STR LF (GAUZE/BANDAGES/DRESSINGS) IMPLANT
BNDG ESMARK 4X9 LF (GAUZE/BANDAGES/DRESSINGS) ×3 IMPLANT
BNDG GAUZE ELAST 4 BULKY (GAUZE/BANDAGES/DRESSINGS) ×3 IMPLANT
CHLORAPREP W/TINT 26ML (MISCELLANEOUS) ×3 IMPLANT
CORDS BIPOLAR (ELECTRODE) ×3 IMPLANT
COVER BACK TABLE 60X90IN (DRAPES) ×3 IMPLANT
COVER MAYO STAND STRL (DRAPES) ×3 IMPLANT
CUFF TOURNIQUET SINGLE 18IN (TOURNIQUET CUFF) ×3 IMPLANT
DRAPE EXTREMITY T 121X128X90 (DRAPE) ×3 IMPLANT
DRAPE OEC MINIVIEW 54X84 (DRAPES) ×3 IMPLANT
DRAPE SURG 17X23 STRL (DRAPES) ×3 IMPLANT
GAUZE SPONGE 4X4 12PLY STRL (GAUZE/BANDAGES/DRESSINGS) ×3 IMPLANT
GAUZE XEROFORM 1X8 LF (GAUZE/BANDAGES/DRESSINGS) ×3 IMPLANT
GLOVE BIO SURGEON STRL SZ7.5 (GLOVE) ×3 IMPLANT
GLOVE BIOGEL PI IND STRL 8 (GLOVE) ×1 IMPLANT
GLOVE BIOGEL PI IND STRL 8.5 (GLOVE) IMPLANT
GLOVE BIOGEL PI INDICATOR 8 (GLOVE) ×2
GLOVE BIOGEL PI INDICATOR 8.5 (GLOVE)
GLOVE SURG ORTHO 8.0 STRL STRW (GLOVE) IMPLANT
GOWN STRL REUS W/ TWL LRG LVL3 (GOWN DISPOSABLE) ×1 IMPLANT
GOWN STRL REUS W/TWL LRG LVL3 (GOWN DISPOSABLE) ×3
GOWN STRL REUS W/TWL XL LVL3 (GOWN DISPOSABLE) ×3 IMPLANT
K-WIRE .035X4 (WIRE) ×2 IMPLANT
K-WIRE .045X4 (WIRE) ×8 IMPLANT
NDL HYPO 25X1 1.5 SAFETY (NEEDLE) IMPLANT
NEEDLE HYPO 25X1 1.5 SAFETY (NEEDLE) IMPLANT
NS IRRIG 1000ML POUR BTL (IV SOLUTION) ×3 IMPLANT
PACK BASIN DAY SURGERY FS (CUSTOM PROCEDURE TRAY) ×3 IMPLANT
PAD CAST 3X4 CTTN HI CHSV (CAST SUPPLIES) IMPLANT
PAD CAST 4YDX4 CTTN HI CHSV (CAST SUPPLIES) ×1 IMPLANT
PADDING CAST COTTON 3X4 STRL (CAST SUPPLIES)
PADDING CAST COTTON 4X4 STRL (CAST SUPPLIES) ×3
SLEEVE SCD COMPRESS KNEE MED (MISCELLANEOUS) IMPLANT
SPLINT PLASTER CAST XFAST 3X15 (CAST SUPPLIES) IMPLANT
SPLINT PLASTER CAST XFAST 4X15 (CAST SUPPLIES) IMPLANT
SPLINT PLASTER XTRA FAST SET 4 (CAST SUPPLIES)
SPLINT PLASTER XTRA FASTSET 3X (CAST SUPPLIES)
STOCKINETTE 4X48 STRL (DRAPES) ×3 IMPLANT
SUT CHROMIC 4 0 PS 2 18 (SUTURE) ×3 IMPLANT
SUT ETHILON 3 0 PS 1 (SUTURE) IMPLANT
SUT ETHILON 4 0 PS 2 18 (SUTURE) ×3 IMPLANT
SUT MERSILENE 4 0 P 3 (SUTURE) IMPLANT
SUT VIC AB 3-0 PS1 18 (SUTURE)
SUT VIC AB 3-0 PS1 18XBRD (SUTURE) IMPLANT
SUT VICRYL 4-0 PS2 18IN ABS (SUTURE) ×3 IMPLANT
SYR BULB 3OZ (MISCELLANEOUS) ×3 IMPLANT
SYR CONTROL 10ML LL (SYRINGE) IMPLANT
TOWEL OR 17X24 6PK STRL BLUE (TOWEL DISPOSABLE) ×6 IMPLANT
UNDERPAD 30X30 (UNDERPADS AND DIAPERS) ×3 IMPLANT

## 2015-09-17 NOTE — Brief Op Note (Signed)
09/17/2015  4:55 PM  PATIENT:  Julie Burnett  45 y.o. female  PRE-OPERATIVE DIAGNOSIS:  Right Index/Long/Ring Finger intraarticular Phalanx Fracture  POST-OPERATIVE DIAGNOSIS:  Right Index/Long/Ring Finger intraarticular Phalanx Fracture  PROCEDURE:  Procedure(s) with comments: Right Index and Long Finger CLOSED REDUCTION AND  PERCUTANEOUS PINNING OF FRACTURES (Right) - Right Index and Long Finger CLOSED REDUCTION AND  PERCUTANEOUS PINNING OF FRACTURES OPEN REDUCTION EXTERNAL FIXATION (ORIF) OF RIGHT RING FINGER (Right) - OPEN REDUCTION EXTERNAL FIXATION (ORIF) OF RIGHT RING FINGER  SURGEON:  Surgeon(s) and Role:    * Betha LoaKevin Mikeya Tomasetti, MD - Primary    * Cindee SaltGary Daishawn Lauf, MD - Assisting  PHYSICIAN ASSISTANT:   ASSISTANTS: Cindee SaltGary Jayvier Burgher, MD   ANESTHESIA:   regional and general  EBL:  Total I/O In: 1200 [I.V.:1200] Out: -   BLOOD ADMINISTERED:none  DRAINS: none   LOCAL MEDICATIONS USED:  NONE  SPECIMEN:  No Specimen  DISPOSITION OF SPECIMEN:  N/A  COUNTS:  YES  TOURNIQUET:   Total Tourniquet Time Documented: Upper Arm (Right) - 51 minutes Total: Upper Arm (Right) - 51 minutes   DICTATION: .Other Dictation: Dictation Number [id number]  PLAN OF CARE: Discharge to home after PACU  PATIENT DISPOSITION:  PACU - hemodynamically stable.

## 2015-09-17 NOTE — Anesthesia Procedure Notes (Addendum)
Anesthesia Regional Block:  Infraclavicular brachial plexus block  Pre-Anesthetic Checklist: ,, timeout performed, Correct Patient, Correct Site, Correct Laterality, Correct Procedure, Correct Position, site marked, Risks and benefits discussed,  Surgical consent,  Pre-op evaluation,  At surgeon's request and post-op pain management  Laterality: Right and Upper  Prep: chloraprep       Needles:  Injection technique: Single-shot  Needle Type: Echogenic Stimulator Needle     Needle Length: 5cm 5 cm Needle Gauge: 21 and 21 G    Additional Needles:  Procedures: ultrasound guided (picture in chart) Infraclavicular brachial plexus block Narrative:  Start time: 09/17/2015 2:54 PM End time: 09/17/2015 2:59 PM Injection made incrementally with aspirations every 5 mL.  Performed by: Personally  Anesthesiologist: Dennice Tindol      Right infraclavicular block image

## 2015-09-17 NOTE — Discharge Instructions (Addendum)

## 2015-09-17 NOTE — Anesthesia Postprocedure Evaluation (Signed)
Anesthesia Post Note  Patient: Julie Burnett  Procedure(s) Performed: Procedure(s) (LRB): Right Index and Long Finger CLOSED REDUCTION AND  PERCUTANEOUS PINNING OF FRACTURES (Right) OPEN REDUCTION EXTERNAL FIXATION (ORIF) OF RIGHT RING FINGER (Right)  Patient location during evaluation: PACU Anesthesia Type: General and Regional Level of consciousness: awake and alert Pain management: pain level controlled Vital Signs Assessment: post-procedure vital signs reviewed and stable Respiratory status: spontaneous breathing, nonlabored ventilation, respiratory function stable and patient connected to nasal cannula oxygen Cardiovascular status: blood pressure returned to baseline and stable Postop Assessment: no signs of nausea or vomiting Anesthetic complications: no    Last Vitals:  Vitals:   09/17/15 1700 09/17/15 1715  BP: 113/74 (!) 135/100  Pulse: 74 98  Resp: 14 (!) 24  Temp:      Last Pain:  Vitals:   09/17/15 1426  TempSrc: Oral  PainSc:                  Reino KentJudd, Aryiah Monterosso J

## 2015-09-17 NOTE — Progress Notes (Signed)
Assisted Dr. Crews with right, ultrasound guided, infraclavicular block. Side rails up, monitors on throughout procedure. See vital signs in flow sheet. Tolerated Procedure well. 

## 2015-09-17 NOTE — Anesthesia Preprocedure Evaluation (Signed)
Anesthesia Evaluation  Patient identified by MRN, date of birth, ID band Patient awake    Reviewed: Allergy & Precautions, NPO status , Patient's Chart, lab work & pertinent test results  Airway Mallampati: I  TM Distance: >3 FB Neck ROM: Full    Dental  (+) Poor Dentition, Dental Advisory Given   Pulmonary Current Smoker,    breath sounds clear to auscultation       Cardiovascular  Rhythm:Regular Rate:Normal     Neuro/Psych Anxiety Depression Bipolar Disorder    GI/Hepatic   Endo/Other  diabetes, Well Controlled, Type 2, Oral Hypoglycemic Agents  Renal/GU      Musculoskeletal   Abdominal   Peds  Hematology   Anesthesia Other Findings ? Substance abuse  Reproductive/Obstetrics                             Anesthesia Physical Anesthesia Plan  ASA: III  Anesthesia Plan: General   Post-op Pain Management: GA combined w/ Regional for post-op pain   Induction: Intravenous  Airway Management Planned: LMA  Additional Equipment:   Intra-op Plan:   Post-operative Plan: Extubation in OR  Informed Consent: I have reviewed the patients History and Physical, chart, labs and discussed the procedure including the risks, benefits and alternatives for the proposed anesthesia with the patient or authorized representative who has indicated his/her understanding and acceptance.   Dental advisory given  Plan Discussed with: CRNA, Anesthesiologist and Surgeon  Anesthesia Plan Comments:         Anesthesia Quick Evaluation

## 2015-09-17 NOTE — Transfer of Care (Signed)
Immediate Anesthesia Transfer of Care Note  Patient: Julie Burnett  Procedure(s) Performed: Procedure(s) with comments: Right Index and Long Finger CLOSED REDUCTION AND  PERCUTANEOUS PINNING OF FRACTURES (Right) - Right Index and Long Finger CLOSED REDUCTION AND  PERCUTANEOUS PINNING OF FRACTURES OPEN REDUCTION EXTERNAL FIXATION (ORIF) OF RIGHT RING FINGER (Right) - OPEN REDUCTION EXTERNAL FIXATION (ORIF) OF RIGHT RING FINGER  Patient Location: PACU  Anesthesia Type:General  Level of Consciousness: awake  Airway & Oxygen Therapy: Patient Spontanous Breathing and Patient connected to face mask oxygen  Post-op Assessment: Report given to RN and Post -op Vital signs reviewed and stable  Post vital signs: Reviewed and stable  Last Vitals:  Vitals:   09/17/15 1515 09/17/15 1530  BP: (!) 110/51 127/80  Pulse: 98 75  Resp: 16 18  Temp:      Last Pain:  Vitals:   09/17/15 1426  TempSrc: Oral  PainSc:          Complications: No apparent anesthesia complications

## 2015-09-17 NOTE — Op Note (Signed)
[id number] 

## 2015-09-17 NOTE — Anesthesia Procedure Notes (Signed)
Procedure Name: LMA Insertion Date/Time: 09/17/2015 3:46 PM Performed by: Caren MacadamARTER, Maridel Pixler W Pre-anesthesia Checklist: Patient identified, Emergency Drugs available, Suction available and Patient being monitored Patient Re-evaluated:Patient Re-evaluated prior to inductionOxygen Delivery Method: Circle system utilized Preoxygenation: Pre-oxygenation with 100% oxygen Intubation Type: IV induction Ventilation: Mask ventilation without difficulty LMA: LMA inserted LMA Size: 4.0 Number of attempts: 1 Airway Equipment and Method: Bite block Placement Confirmation: positive ETCO2 and breath sounds checked- equal and bilateral Tube secured with: Tape Dental Injury: Teeth and Oropharynx as per pre-operative assessment

## 2015-09-17 NOTE — Op Note (Signed)
I assisted Surgeon(s) and Role:    * Betha LoaKevin Zaina Jenkin, MD - Primary    * Cindee SaltGary Olumide Dolinger, MD - Assisting on the Procedure(s): Right Index and Long Finger CLOSED REDUCTION AND  PERCUTANEOUS PINNING OF FRACTURES OPEN REDUCTION EXTERNAL FIXATION (ORIF) OF RIGHT RING FINGER on 09/17/2015.  I provided assistance on this case as follows: reduction, traction for manipulation of the fracture, application of the external fixator. Application of dressings and the splint. I was present for the entire case.  Electronically signed by: Nicki ReaperKUZMA,Arville Postlewaite R, MD Date: 09/17/2015 Time: 4:54 PM

## 2015-09-17 NOTE — H&P (Signed)
Julie Burnett is an 45 y.o. female.   Chief Complaint: right finger fractures HPI: 45 yo female states she fell from standing height injuring right index, long , and ring fingers ~2 weeks ago.  Seen at [facility] where XR revealed fractures of index, long, and ring fingers.  Closed reduction performed by ED.  Followed up in office.  CT shows comminuted fractures, intrraarticular of pip of ring finger with impaction of fragments.  Allergies: No Known Allergies  Past Medical History:  Diagnosis Date  . Alcohol dependence (HCC)   . Anxiety   . Bipolar 1 disorder (HCC)   . Depression   . Diabetes mellitus    supposed to take metformin but does not take  . H/O suicide attempt   . High cholesterol   . Liver disease   . Polysubstance abuse     Past Surgical History:  Procedure Laterality Date  . OPEN REDUCTION INTERNAL FIXATION (ORIF) DISTAL RADIAL FRACTURE Right 05/16/2015   Procedure: RIGHT OPEN REDUCTION INTERNAL FIXATION (ORIF) DISTAL RADIUS AND SCAPHOID ORIF;  Surgeon: Betha Loa, MD;  Location: [facility];  Service: Orthopedics;  Laterality: Right;  . ORIF SCAPHOID FRACTURE Right 05/16/2015   Procedure: RIGHT OPEN REDUCTION INTERNAL FIXATION (ORIF) SCAPHOID FRACTURE;  Surgeon: Betha Loa, MD;  Location: [facility];  Service: Orthopedics;  Laterality: Right;    Family History: History reviewed. No pertinent family history.  Social History:   reports that she has been smoking Cigarettes.  She has a 25.50 pack-year smoking history. She has never used smokeless tobacco. She reports that she drinks about 3.6 oz of alcohol per week . She reports that she uses drugs, including Marijuana and "Crack" cocaine.  Medications: Medications Prior to Admission  Medication Sig Dispense Refill  . amphetamine-dextroamphetamine (ADDERALL) 20 MG tablet Take 20 mg by mouth daily.    Marland Kitchen ibuprofen (ADVIL,MOTRIN) 200 MG tablet Take 400 mg by mouth every 6 (six) hours as needed.     . metFORMIN (GLUCOPHAGE) 500 MG tablet Take 500 mg by mouth 2 (two) times daily with a meal.    . oxyCODONE-acetaminophen (PERCOCET) 10-325 MG tablet Take 1 tablet by mouth every 4 (four) hours as needed for pain. 30 tablet 0  . pregabalin (LYRICA) 75 MG capsule Take 75 mg by mouth 2 (two) times daily.      Results for orders placed or performed during the hospital encounter of 09/17/15 (from the past 48 hour(s))  I-STAT, chem 8     Status: Abnormal   Collection Time: 09/17/15  2:24 PM  Result Value Ref Range   Sodium 140 135 - 145 mmol/L   Potassium 3.8 3.5 - 5.1 mmol/L   Chloride 102 101 - 111 mmol/L   BUN <3 (L) 6 - 20 mg/dL   Creatinine, Ser 1.61 (L) 0.44 - 1.00 mg/dL   Glucose, Bld 096 (H) 65 - 99 mg/dL   Calcium, Ion 0.45 4.09 - 1.40 mmol/L   TCO2 23 0 - 100 mmol/L   Hemoglobin 13.9 12.0 - 15.0 g/dL   HCT 81.1 91.4 - 78.2 %    No results found.   A comprehensive review of systems was negative.  Blood pressure 131/85, pulse 93, temperature 98.2 F (36.8 C), temperature source Oral, resp. rate 18, height 5\' 4"  (1.626 m), weight 72.6 kg (160 lb), last menstrual period 08/17/2015, SpO2 100 %.  General appearance: alert, cooperative and appears stated age Head: Normocephalic, without obvious abnormality, atraumatic Neck: supple, symmetrical, trachea midline  Resp: clear to auscultation bilaterally Cardio: regular rate and rhythm GI: non-tender Extremities: Intact sensation and capillary refill all digits.  +epl/fpl/io.  No wounds.  Pulses: 2+ and symmetric Skin: Skin color, texture, turgor normal. No rashes or lesions Neurologic: Grossly normal Incision/Wound:none  Assessment/Plan Right index, long, ring finger fractures.  Non operative and operative treatment options were discussed with the patient and patient wishes to proceed with operative treatment. Recommend operative fixation.  Risks, benefits, and alternatives of surgery were discussed and the patient agrees with  the plan of care.   Julie Burnett R 09/17/2015, 3:25 PM

## 2015-09-18 ENCOUNTER — Encounter (HOSPITAL_BASED_OUTPATIENT_CLINIC_OR_DEPARTMENT_OTHER): Payer: Self-pay | Admitting: Orthopedic Surgery

## 2015-09-18 NOTE — Op Note (Signed)
Julie Burnett, KJOS NO.:  [id number]  MEDICAL RECORD NO.:  [id number]  LOCATION:                                 FACILITY:  PHYSICIAN:  Betha Loa, MD        DATE OF BIRTH:  01/18/1970  DATE OF PROCEDURE:  09/17/2015 DATE OF DISCHARGE:                              OPERATIVE REPORT   PREOPERATIVE DIAGNOSIS:  Right index, long, and ring finger fractures, intra-articular.  POSTOPERATIVE DIAGNOSIS:  Right index and long finger intra-articular proximal phalanx fractures and right ring finger, middle phalanx fracture, intra-articular proximal interphalangeal joint.  PROCEDURE:   1. Right index finger closed reduction percutaneous pinning of intra-articular proximal phalanx fracture 2. Right long finger closed reduction percutaneous pinning of proximal phalanx intra-articular fracture 3. Right ring finger open reduction of middle phalanx intra- articular fracture 4. Right ring finger application of uniplane external fixator.  SURGEON:  Betha Loa, MD.  ASSISTANT:  Cindee Salt, MD.  ANESTHESIA:  General with regional.  IV FLUIDS:  Per Anesthesia flow sheet.  ESTIMATED BLOOD LOSS:  Minimal.  COMPLICATIONS:  None.  SPECIMENS:  None.  TOURNIQUET TIME:  51 minutes.  DISPOSITION:  Stable to PACU.  INDICATIONS:  Julie Burnett is a 45 year old female who fell approximately 2 weeks ago injuring her right index, long, and ring fingers.  She was seen at the [facility] Emergency Department where radiographs were taken revealing a proximal phalanx fracture.  Closed reduction was performed by the emergency department staff.  She was splinted and followed up in the office.  We discussed operative fixation.  Risks, benefits, and alternatives of surgery were discussed including risk of blood loss; infection; damage to nerves, vessels, tendons, ligaments, bone, failure of surgery, need for additional surgery; complications with wound healing; continued pain;  nonunion; malunion; stiffness.  She voiced understanding of these risks and elected to proceed.  OPERATIVE COURSE:  After being identified preoperatively by myself, the patient and I agreed upon procedure and site of procedure.  Surgical site was marked.  Risks, benefits, and alternatives of surgery were reviewed and she wished to proceed.  Surgical consent had been signed. She was given IV Ancef as preoperative antibiotic prophylaxis.  Regional block was performed by Anesthesia in the preoperative holding room.  She was transported to the operating room and placed on the operating room table in a supine position with the right upper extremity on the arm board.  General anesthesia was induced by Anesthesiology.  Right upper extremity was prepped and draped in normal sterile orthopedic fashion. Surgical pause was performed between surgeons, anesthesia, and operating room staff, and all were in agreement as to patient, procedure, and site of procedure.  Tourniquet at the proximal aspect of the extremity was inflated to 250 mmHg after exsanguination of the limb with an Esmarch bandage.  C-arm was used in AP, lateral, and oblique projections throughout the case.  Closed reduction was performed of the index and long finger proximal phalanx fractures.  Adequate reduction was confirmed by the C-arm.  Two 0.045 inch K-wires were then used.  These were advanced through the metacarpal head across the MP joint into the proximal phalanx.  This was adequate to stabilize the index finger proximal phalanx fracture.  The long finger fracture was addressed in the same fashion with a 0.045 inch K-wire through the metacarpal head and into the proximal phalanx.  Dissection stabilized the proximal phalanx fracture of the long finger.  These fractures were intra- articular.  Care was taken to ensure appropriate reduction.  There was good alignment of the digits.  The ring finger was then addressed.  C- arm  was again used in AP and lateral projections to aid in reduction.  A 0.045 inch K-wire was poked through the skin and used to reduce the fracture fragments at the base of the middle phalanx.  This was intra- articular at the PIP joint.  Subluxation of the joint was reduced as best possible.  An external fixator was then applied.  A 0.045 inch K- wire was then advanced through the condyles of the proximal phalanx.  An additional K-wire that was a 0.035 inch was then advanced through the middle phalanx distal to the fracture site.  A Steinmann pin was then bent in appropriate fashion to provide a traction external fixator.  The C-arm was used in AP and lateral projections to ensure appropriate reduction and position of the fixation device, which was confirmed.  The PIP joint was able to go through range of motion.  Acceptable reduction had been obtained.  The pins were all bent and cut short.  Pin sites were dressed with sterile Xeroform, 4x4s, and wrapped with a Kerlix bandage.  A volar splint was placed and wrapped with Kerlix and Ace bandage.  Tourniquet was deflated at 51 minutes.  Fingertips were pink with brisk capillary refill after deflation of the tourniquet.  The operative drapes were broken down and the patient was awakened from anesthesia safely.  She was transferred back to the stretcher and taken to PACU in stable condition.  I will see her back in the office in 1 week for postoperative followup.  I will give her Percocet 10/325, one to two p.o. q.6 hours p.r.n. pain dispensed #30.     Betha LoaKevin Rasheen Schewe, MD     KK/MEDQ  D:  09/17/2015  T:  09/18/2015  Job:  [id number]

## 2015-09-19 NOTE — Op Note (Signed)
Intra-operative fluoroscopic images in the AP, lateral, and oblique views were taken and evaluated by myself.  Reduction and hardware placement were confirmed.  There was no intraarticular penetration of permanent hardware.  

## 2015-09-24 DIAGNOSIS — S62612D Displaced fracture of proximal phalanx of right middle finger, subsequent encounter for fracture with routine healing: Secondary | ICD-10-CM | POA: Insufficient documentation

## 2015-09-24 DIAGNOSIS — S62624D Displaced fracture of medial phalanx of right ring finger, subsequent encounter for fracture with routine healing: Secondary | ICD-10-CM | POA: Insufficient documentation

## 2015-09-24 DIAGNOSIS — S62610D Displaced fracture of proximal phalanx of right index finger, subsequent encounter for fracture with routine healing: Secondary | ICD-10-CM | POA: Insufficient documentation

## 2015-10-01 DIAGNOSIS — M79644 Pain in right finger(s): Secondary | ICD-10-CM | POA: Insufficient documentation

## 2015-10-01 DIAGNOSIS — M25641 Stiffness of right hand, not elsewhere classified: Secondary | ICD-10-CM | POA: Insufficient documentation

## 2015-10-03 ENCOUNTER — Ambulatory Visit: Payer: MEDICAID | Admitting: Occupational Therapy

## 2016-03-15 ENCOUNTER — Emergency Department
Admission: EM | Admit: 2016-03-15 | Discharge: 2016-03-16 | Disposition: A | Discharge status: Home / Self Care | Attending: Emergency Medicine | Admitting: Emergency Medicine

## 2016-03-15 DIAGNOSIS — Y999 Unspecified external cause status: Secondary | ICD-10-CM | POA: Insufficient documentation

## 2016-03-15 DIAGNOSIS — Z7984 Long term (current) use of oral hypoglycemic drugs: Secondary | ICD-10-CM | POA: Insufficient documentation

## 2016-03-15 DIAGNOSIS — E119 Type 2 diabetes mellitus without complications: Secondary | ICD-10-CM | POA: Insufficient documentation

## 2016-03-15 DIAGNOSIS — Y939 Activity, unspecified: Secondary | ICD-10-CM | POA: Insufficient documentation

## 2016-03-15 DIAGNOSIS — F10120 Alcohol abuse with intoxication, uncomplicated: Secondary | ICD-10-CM | POA: Insufficient documentation

## 2016-03-15 DIAGNOSIS — W2201XA Walked into wall, initial encounter: Secondary | ICD-10-CM | POA: Insufficient documentation

## 2016-03-15 DIAGNOSIS — Y929 Unspecified place or not applicable: Secondary | ICD-10-CM | POA: Insufficient documentation

## 2016-03-15 DIAGNOSIS — F17210 Nicotine dependence, cigarettes, uncomplicated: Secondary | ICD-10-CM | POA: Insufficient documentation

## 2016-03-15 DIAGNOSIS — F1010 Alcohol abuse, uncomplicated: Secondary | ICD-10-CM

## 2016-03-15 DIAGNOSIS — S0083XA Contusion of other part of head, initial encounter: Secondary | ICD-10-CM | POA: Insufficient documentation

## 2016-03-15 DIAGNOSIS — F10920 Alcohol use, unspecified with intoxication, uncomplicated: Secondary | ICD-10-CM

## 2016-03-15 NOTE — ED Notes (Addendum)
Pt refusing to have her blood drawn at this time.

## 2016-03-15 NOTE — ED Triage Notes (Signed)
Pt ambulatory to triage with unsteady gait. Pt in custody of [employer] officer Shawnie PonsPratt with handcuffs on. Pt here for medical clearance to go to jail. Pt cursing the staff and the officer.

## 2016-03-15 NOTE — ED Notes (Signed)
Pt alert and oriented upon assessment. Pt in police custody. Pt in no distress upon assessment. Pt given warm blankets.

## 2016-03-16 ENCOUNTER — Emergency Department

## 2016-03-16 LAB — COMPREHENSIVE METABOLIC PANEL
ALT: 16 U/L (ref 14–54)
AST: 22 U/L (ref 15–41)
Albumin: 4 g/dL (ref 3.5–5.0)
Alkaline Phosphatase: 80 U/L (ref 38–126)
Anion gap: 9 (ref 5–15)
BUN: 6 mg/dL (ref 6–20)
CHLORIDE: 108 mmol/L (ref 101–111)
CO2: 25 mmol/L (ref 22–32)
CREATININE: 0.55 mg/dL (ref 0.44–1.00)
Calcium: 9.4 mg/dL (ref 8.9–10.3)
GFR calc non Af Amer: >60 mL/min (ref >60)
Glucose, Bld: 128 mg/dL — ABNORMAL HIGH (ref 65–99)
POTASSIUM: 3.4 mmol/L — AB (ref 3.5–5.1)
SODIUM: 142 mmol/L (ref 135–145)
Total Bilirubin: 0.3 mg/dL (ref 0.3–1.2)
Total Protein: 7.7 g/dL (ref 6.5–8.1)

## 2016-03-16 LAB — CBC
HCT: 39.5 % (ref 35.0–47.0)
Hemoglobin: 13.4 g/dL (ref 12.0–16.0)
MCH: 30.1 pg (ref 26.0–34.0)
MCHC: 33.8 g/dL (ref 32.0–36.0)
MCV: 88.9 fL (ref 80.0–100.0)
PLATELETS: 280 10*3/uL (ref 150–440)
RBC: 4.44 MIL/uL (ref 3.80–5.20)
RDW: 13.3 % (ref 11.5–14.5)
WBC: 6.7 10*3/uL (ref 3.6–11.0)

## 2016-03-16 LAB — ETHANOL: Alcohol, Ethyl (B): 260 mg/dL — ABNORMAL HIGH (ref <5)

## 2016-03-16 MED ORDER — IPRATROPIUM-ALBUTEROL 0.5-2.5 (3) MG/3ML IN SOLN
3.0000 mL | Freq: Once | RESPIRATORY_TRACT | Status: AC
  Administered 2016-03-16: 3 mL via RESPIRATORY_TRACT
  Filled 2016-03-16: qty 3

## 2016-03-16 MED ORDER — IBUPROFEN 600 MG PO TABS
600.0000 mg | ORAL_TABLET | Freq: Once | ORAL | Status: AC
  Administered 2016-03-16: 600 mg via ORAL
  Filled 2016-03-16: qty 1
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
## 2016-03-16 NOTE — ED Notes (Signed)
Patient transported to CT 
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
## 2016-06-04 ENCOUNTER — Emergency Department (HOSPITAL_COMMUNITY): Payer: Self-pay

## 2016-06-04 ENCOUNTER — Emergency Department (HOSPITAL_COMMUNITY)
Admission: EM | Admit: 2016-06-04 | Discharge: 2016-06-04 | Discharge status: Against Medical Advice | Payer: Self-pay | Attending: Emergency Medicine | Admitting: Emergency Medicine

## 2016-06-04 ENCOUNTER — Encounter (HOSPITAL_COMMUNITY): Payer: Self-pay | Admitting: *Deleted

## 2016-06-04 ENCOUNTER — Emergency Department (HOSPITAL_COMMUNITY): Admission: EM | Admit: 2016-06-04 | Discharge: 2016-06-04 | Discharge status: Absent without leave

## 2016-06-04 DIAGNOSIS — Z765 Malingerer [conscious simulation]: Secondary | ICD-10-CM

## 2016-06-04 DIAGNOSIS — Y999 Unspecified external cause status: Secondary | ICD-10-CM | POA: Insufficient documentation

## 2016-06-04 DIAGNOSIS — S0003XA Contusion of scalp, initial encounter: Secondary | ICD-10-CM | POA: Insufficient documentation

## 2016-06-04 DIAGNOSIS — E119 Type 2 diabetes mellitus without complications: Secondary | ICD-10-CM | POA: Insufficient documentation

## 2016-06-04 DIAGNOSIS — S6000XA Contusion of unspecified finger without damage to nail, initial encounter: Secondary | ICD-10-CM

## 2016-06-04 DIAGNOSIS — S60221A Contusion of right hand, initial encounter: Secondary | ICD-10-CM | POA: Insufficient documentation

## 2016-06-04 DIAGNOSIS — Y939 Activity, unspecified: Secondary | ICD-10-CM | POA: Insufficient documentation

## 2016-06-04 DIAGNOSIS — S20229A Contusion of unspecified back wall of thorax, initial encounter: Secondary | ICD-10-CM | POA: Insufficient documentation

## 2016-06-04 DIAGNOSIS — Y929 Unspecified place or not applicable: Secondary | ICD-10-CM | POA: Insufficient documentation

## 2016-06-04 DIAGNOSIS — Z760 Encounter for issue of repeat prescription: Secondary | ICD-10-CM | POA: Insufficient documentation

## 2016-06-04 DIAGNOSIS — I10 Essential (primary) hypertension: Secondary | ICD-10-CM | POA: Insufficient documentation

## 2016-06-04 HISTORY — DX: Essential (primary) hypertension: I10

## 2016-06-04 HISTORY — DX: Dorsalgia, unspecified: M54.9

## 2016-06-04 HISTORY — DX: Type 2 diabetes mellitus without complications: E11.9

## 2016-06-04 LAB — HCG, QUANTITATIVE, PREGNANCY: hCG, Beta Chain, Quant, S: <1 m[IU]/mL (ref <5)

## 2016-06-04 MED ORDER — ACETAMINOPHEN 325 MG PO TABS
650.0000 mg | ORAL_TABLET | Freq: Once | ORAL | Status: AC
Start: 2016-06-04 — End: 2016-06-04
  Administered 2016-06-04: 650 mg via ORAL
  Filled 2016-06-04: qty 2

## 2016-06-04 MED ORDER — IBUPROFEN 400 MG PO TABS
400.0000 mg | ORAL_TABLET | Freq: Once | ORAL | Status: AC
Start: 2016-06-04 — End: 2016-06-04
  Administered 2016-06-04: 400 mg via ORAL
  Filled 2016-06-04: qty 1

## 2016-06-04 MED ORDER — OXYCODONE HCL 5 MG PO TABS: 10.0000 mg | ORAL_TABLET | Freq: Once | ORAL | Status: DC

## 2016-06-04 NOTE — ED Notes (Addendum)
Pt gave incorrect name and social security number to EMS, registration and this RN. She verified initial information as correct "Julie Burnett", on review, the pt gave incorrect information and registration notified to merge chart for alias, pt correct name is "Julie Burnett".
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
## 2016-06-06 ENCOUNTER — Encounter (HOSPITAL_COMMUNITY): Payer: Self-pay | Admitting: Emergency Medicine

## 2016-06-06 ENCOUNTER — Emergency Department (HOSPITAL_COMMUNITY): Payer: Self-pay

## 2016-06-06 ENCOUNTER — Emergency Department (HOSPITAL_COMMUNITY)
Admission: EM | Admit: 2016-06-06 | Discharge: 2016-06-06 | Disposition: A | Discharge status: Home / Self Care | Payer: Self-pay | Attending: Emergency Medicine | Admitting: Emergency Medicine

## 2016-06-06 DIAGNOSIS — Y999 Unspecified external cause status: Secondary | ICD-10-CM | POA: Insufficient documentation

## 2016-06-06 DIAGNOSIS — E119 Type 2 diabetes mellitus without complications: Secondary | ICD-10-CM | POA: Insufficient documentation

## 2016-06-06 DIAGNOSIS — Y939 Activity, unspecified: Secondary | ICD-10-CM | POA: Insufficient documentation

## 2016-06-06 DIAGNOSIS — S60221A Contusion of right hand, initial encounter: Secondary | ICD-10-CM | POA: Insufficient documentation

## 2016-06-06 DIAGNOSIS — M545 Low back pain, unspecified: Secondary | ICD-10-CM

## 2016-06-06 DIAGNOSIS — M25512 Pain in left shoulder: Secondary | ICD-10-CM | POA: Insufficient documentation

## 2016-06-06 DIAGNOSIS — Y929 Unspecified place or not applicable: Secondary | ICD-10-CM | POA: Insufficient documentation

## 2016-06-06 DIAGNOSIS — Z7984 Long term (current) use of oral hypoglycemic drugs: Secondary | ICD-10-CM | POA: Insufficient documentation

## 2016-06-06 DIAGNOSIS — M79641 Pain in right hand: Secondary | ICD-10-CM

## 2016-06-06 DIAGNOSIS — F17210 Nicotine dependence, cigarettes, uncomplicated: Secondary | ICD-10-CM | POA: Insufficient documentation

## 2016-06-06 HISTORY — DX: Malingerer (conscious simulation): Z76.5

## 2016-06-06 MED ORDER — IBUPROFEN 400 MG PO TABS
400.0000 mg | ORAL_TABLET | Freq: Once | ORAL | Status: AC
  Administered 2016-06-06: 400 mg via ORAL
  Filled 2016-06-06: qty 1

## 2016-06-06 MED ORDER — NAPROXEN 250 MG PO TABS
250.0000 mg | ORAL_TABLET | Freq: Two times a day (BID) | ORAL | 0 refills | Status: DC | PRN
Start: 2016-06-06 — End: 2017-01-06

## 2016-06-06 MED ORDER — METHOCARBAMOL 500 MG PO TABS: 1000.0000 mg | ORAL_TABLET | Freq: Four times a day (QID) | ORAL | 0 refills | Status: DC | PRN

## 2016-06-06 MED ORDER — ACETAMINOPHEN 325 MG PO TABS
650.0000 mg | ORAL_TABLET | Freq: Once | ORAL | Status: AC
  Administered 2016-06-06: 650 mg via ORAL
  Filled 2016-06-06: qty 2

## 2016-06-06 NOTE — Discharge Instructions (Signed)
Take the prescriptions as directed.  Apply moist heat or ice to the area(s) of discomfort, for 15 minutes at a time, several times per day for the next few days.  Do not fall asleep on a heating or ice pack.  Call your regular medical doctor on Tuesday to schedule a follow up appointment this week.  Return to the Emergency Department immediately if worsening. ° °
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
## 2016-06-06 NOTE — ED Notes (Signed)
Pt is upset about what happened her last visit.  Informed pt that we will provide your great services this visit.
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
## 2016-06-09 ENCOUNTER — Encounter (HOSPITAL_COMMUNITY): Payer: Self-pay | Admitting: Emergency Medicine

## 2017-01-05 ENCOUNTER — Emergency Department (HOSPITAL_COMMUNITY)
Admission: EM | Admit: 2017-01-05 | Discharge: 2017-01-06 | Disposition: A | Discharge status: Home / Self Care | Attending: Emergency Medicine | Admitting: Emergency Medicine

## 2017-01-05 ENCOUNTER — Encounter (HOSPITAL_COMMUNITY): Payer: Self-pay | Admitting: Emergency Medicine

## 2017-01-05 DIAGNOSIS — E119 Type 2 diabetes mellitus without complications: Secondary | ICD-10-CM | POA: Insufficient documentation

## 2017-01-05 DIAGNOSIS — R0789 Other chest pain: Secondary | ICD-10-CM

## 2017-01-05 DIAGNOSIS — F17210 Nicotine dependence, cigarettes, uncomplicated: Secondary | ICD-10-CM | POA: Insufficient documentation

## 2017-01-05 DIAGNOSIS — I10 Essential (primary) hypertension: Secondary | ICD-10-CM | POA: Insufficient documentation

## 2017-01-05 DIAGNOSIS — Z79899 Other long term (current) drug therapy: Secondary | ICD-10-CM | POA: Insufficient documentation

## 2017-01-05 DIAGNOSIS — Z7984 Long term (current) use of oral hypoglycemic drugs: Secondary | ICD-10-CM | POA: Insufficient documentation

## 2017-01-05 MED ORDER — IBUPROFEN 400 MG PO TABS
400.0000 mg | ORAL_TABLET | Freq: Once | ORAL | Status: AC
  Administered 2017-01-06: 400 mg via ORAL
  Filled 2017-01-05: qty 1

## 2017-01-05 MED ORDER — METHOCARBAMOL 500 MG PO TABS
1000.0000 mg | ORAL_TABLET | Freq: Once | ORAL | Status: AC
  Administered 2017-01-06: 1000 mg via ORAL
  Filled 2017-01-05: qty 2

## 2017-01-05 NOTE — ED Triage Notes (Signed)
CP x 2-3 hours

## 2017-01-06 LAB — CBC WITH DIFFERENTIAL/PLATELET
Basophils Absolute: 0 10*3/uL (ref 0.0–0.1)
Basophils Relative: 0 %
EOS PCT: 2 %
Eosinophils Absolute: 0.1 10*3/uL (ref 0.0–0.7)
HCT: 41.6 % (ref 36.0–46.0)
HEMOGLOBIN: 13.3 g/dL (ref 12.0–15.0)
LYMPHS ABS: 2.6 10*3/uL (ref 0.7–4.0)
LYMPHS PCT: 34 %
MCH: 29.2 pg (ref 26.0–34.0)
MCHC: 32 g/dL (ref 30.0–36.0)
MCV: 91.4 fL (ref 78.0–100.0)
Monocytes Absolute: 0.6 10*3/uL (ref 0.1–1.0)
Monocytes Relative: 8 %
NEUTROS PCT: 56 %
Neutro Abs: 4.3 10*3/uL (ref 1.7–7.7)
Platelets: 274 10*3/uL (ref 150–400)
RBC: 4.55 MIL/uL (ref 3.87–5.11)
RDW: 14.2 % (ref 11.5–15.5)
WBC: 7.6 10*3/uL (ref 4.0–10.5)

## 2017-01-06 LAB — COMPREHENSIVE METABOLIC PANEL
ALBUMIN: 4.1 g/dL (ref 3.5–5.0)
ALT: 14 U/L (ref 14–54)
AST: 18 U/L (ref 15–41)
Alkaline Phosphatase: 101 U/L (ref 38–126)
Anion gap: 11 (ref 5–15)
BUN: 7 mg/dL (ref 6–20)
CHLORIDE: 103 mmol/L (ref 101–111)
CO2: 24 mmol/L (ref 22–32)
CREATININE: 0.68 mg/dL (ref 0.44–1.00)
Calcium: 9.3 mg/dL (ref 8.9–10.3)
GFR calc non Af Amer: >60 mL/min (ref >60)
Glucose, Bld: 135 mg/dL — ABNORMAL HIGH (ref 65–99)
Potassium: 4.3 mmol/L (ref 3.5–5.1)
SODIUM: 138 mmol/L (ref 135–145)
Total Bilirubin: 0.4 mg/dL (ref 0.3–1.2)
Total Protein: 8.4 g/dL — ABNORMAL HIGH (ref 6.5–8.1)

## 2017-01-06 LAB — TROPONIN I: Troponin I: <0.03 ng/mL (ref <0.03)

## 2017-01-06 LAB — I-STAT TROPONIN, ED: Troponin i, poc: 0 ng/mL (ref 0.00–0.08)

## 2017-01-06 MED ORDER — METHOCARBAMOL 500 MG PO TABS: ORAL_TABLET | ORAL | 0 refills | Status: DC

## 2017-01-06 MED ORDER — NAPROXEN 250 MG PO TABS: 250.0000 mg | ORAL_TABLET | Freq: Two times a day (BID) | ORAL | 0 refills | Status: DC

## 2017-01-06 NOTE — Discharge Instructions (Signed)
Use ice and heat for comfort. Take the medications as prescribed for your chest wall pain. You will need to follow up with Dr Janna Archondiego about your chronic pain medication, it appears he has been decreasing your dose and cutting back on the number of pills he is prescribing for you in the past month.
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
## 2017-02-17 IMAGING — CR DG HAND COMPLETE 3+V*R*
1 series · 3 of 3 positions shown · non-contrast
Comparison: CT right wrist May 14, 2015

CLINICAL DATA: Pain following fall

EXAM:
RIGHT HAND - COMPLETE 3+ VIEW

[Series 2: pa · 0.17mm/px · 3 of 3 slices shown]
[im 1/3]
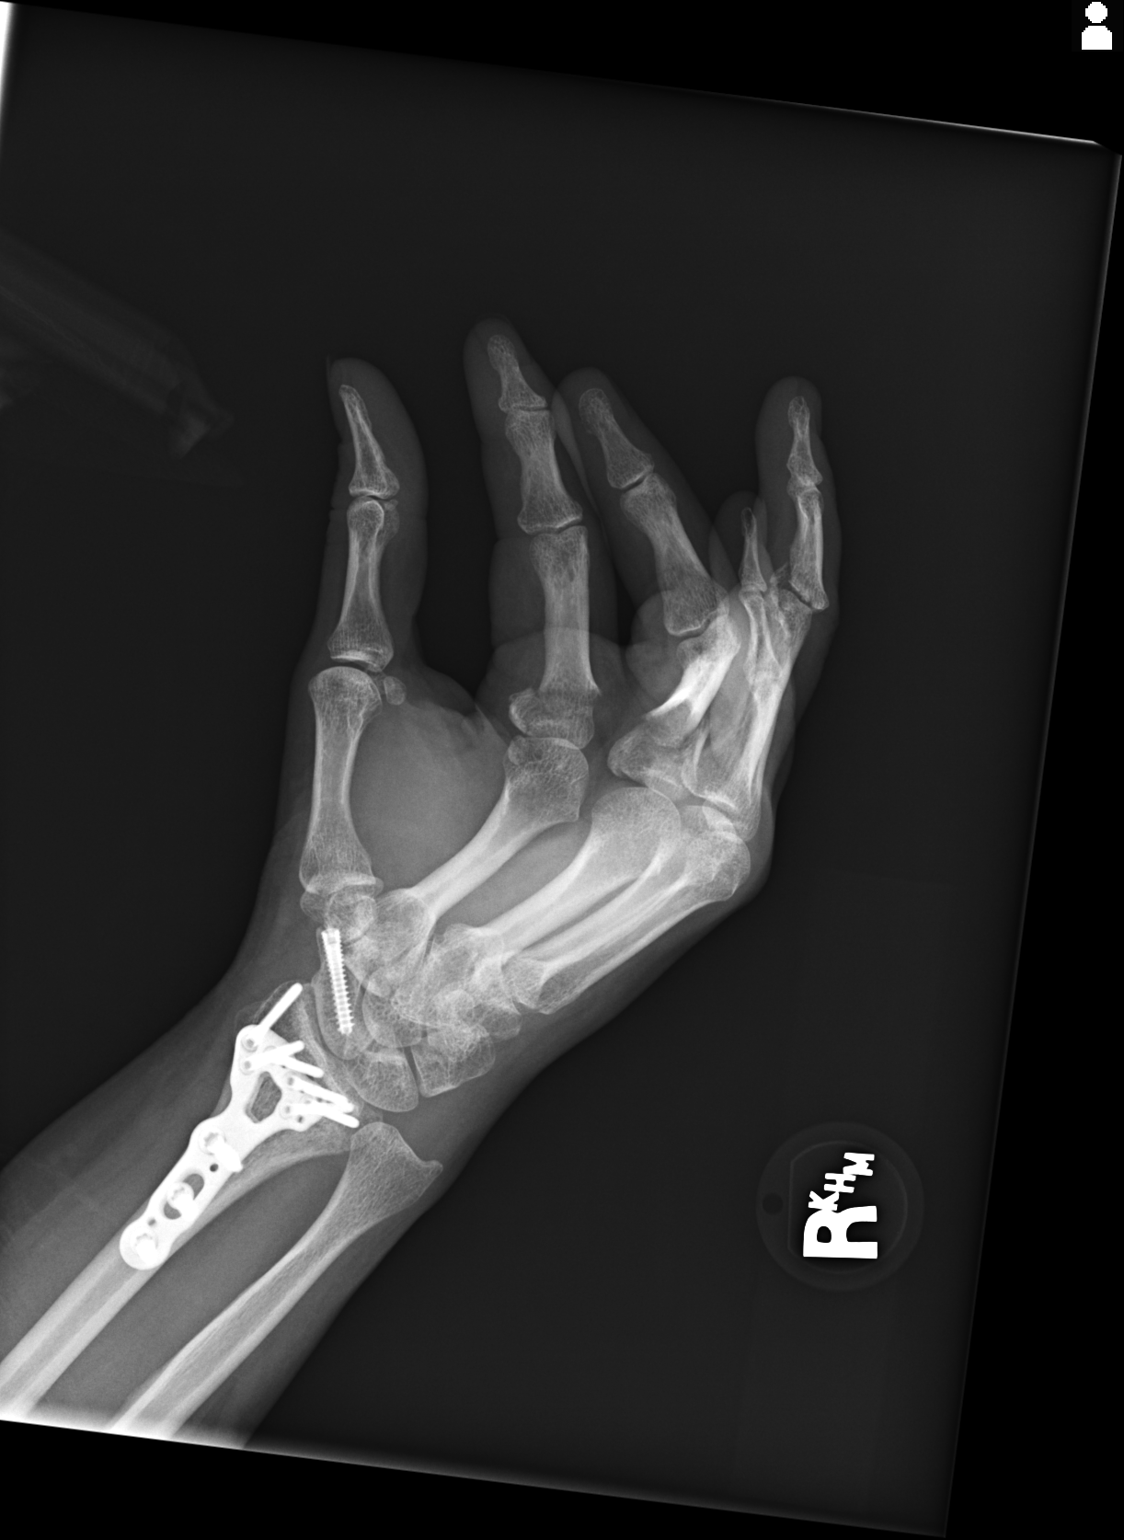
[im 2/3]
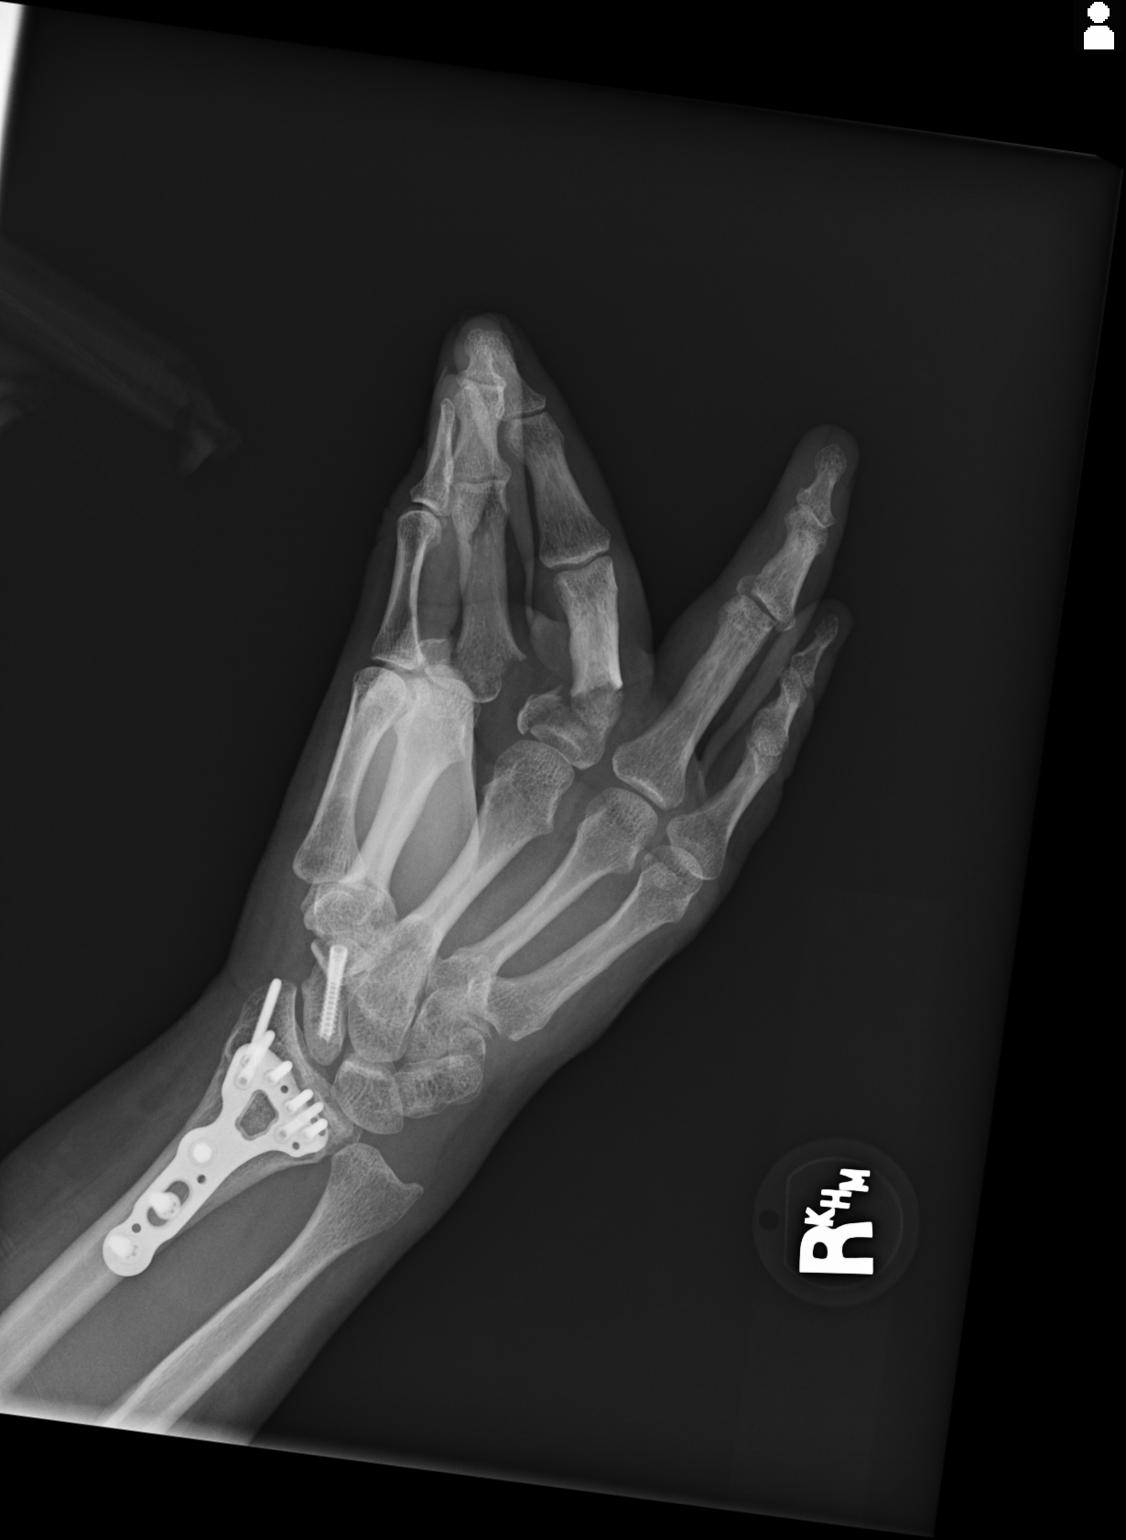
[im 3/3]
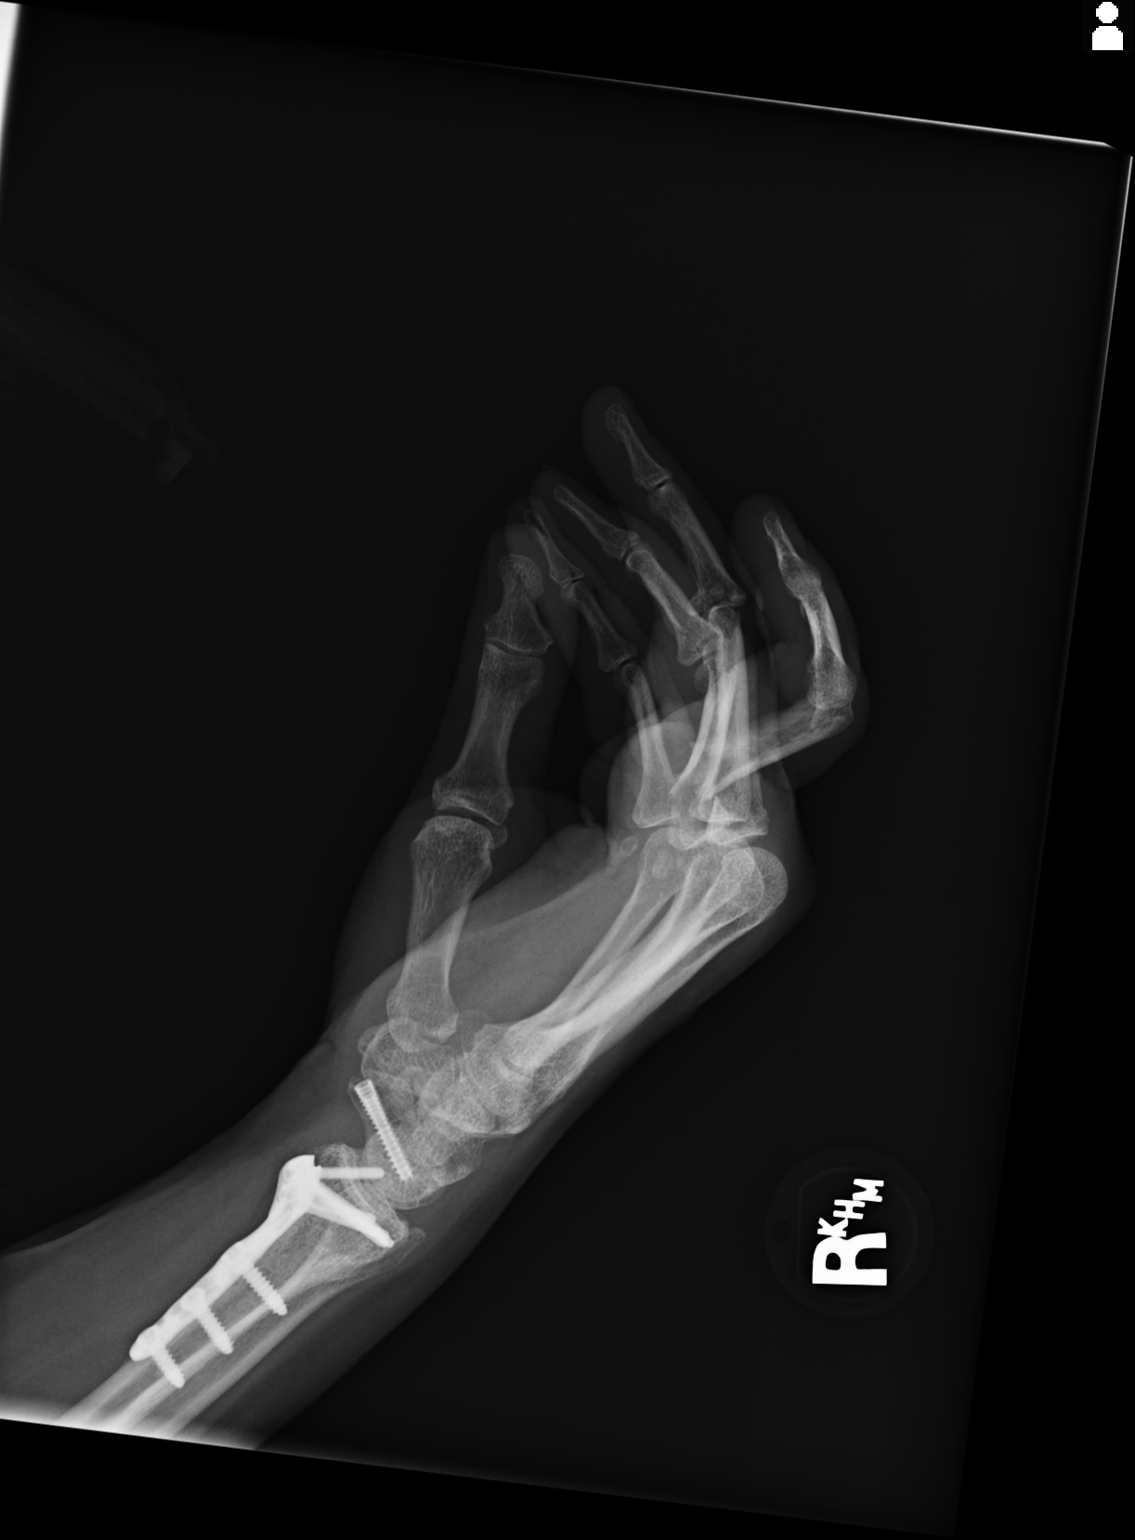

[3 of 3 positions shown; findings below may reference images not displayed]

FINDINGS: Frontal, oblique, and lateral views were obtained. There are
comminuted fractures of the proximal aspects of the second and third
proximal phalanges ease with lateral angulation and medial
displacement of the major distal fracture fragments with respect
proximal fragments. Fracture of the lateral aspect of the proximal
portion of the third proximal phalanx extends into the third MCP
joint region. There is also a fracture along the volar aspect of the
proximal portion of the fourth middle phalanx with mild volar
displacement of the avulsed fragment.

No other fractures are evident. No dislocations. Postoperative
change is noted in the distal radius and scaphoid with screw and
plate fixation in these respective areas.
IMPRESSION: Comminuted fractures of the proximal aspects of the second and third
proximal phalanges. There is displacement and angulation as noted
above. Note that third proximal phalanx fracture extends into the
third MCP joint along the lateral aspect. There is also a fracture
of the volar aspect of the proximal portion of the fourth middle
phalanx with mild displacement of the avulsed fragment.

Postoperative change in the distal radius and scaphoid noted.
Alignment of prior fractures in these areas is anatomic.

## 2017-02-17 IMAGING — CR DG HAND COMPLETE 3+V*R*
1 series · 3 of 3 positions shown · non-contrast
Comparison: 08/31/2015

CLINICAL DATA: Postreduction

EXAM:
RIGHT HAND - COMPLETE 3+ VIEW

[Series 2: pa · 0.17mm/px · 3 of 3 slices shown]
[im 1/3]
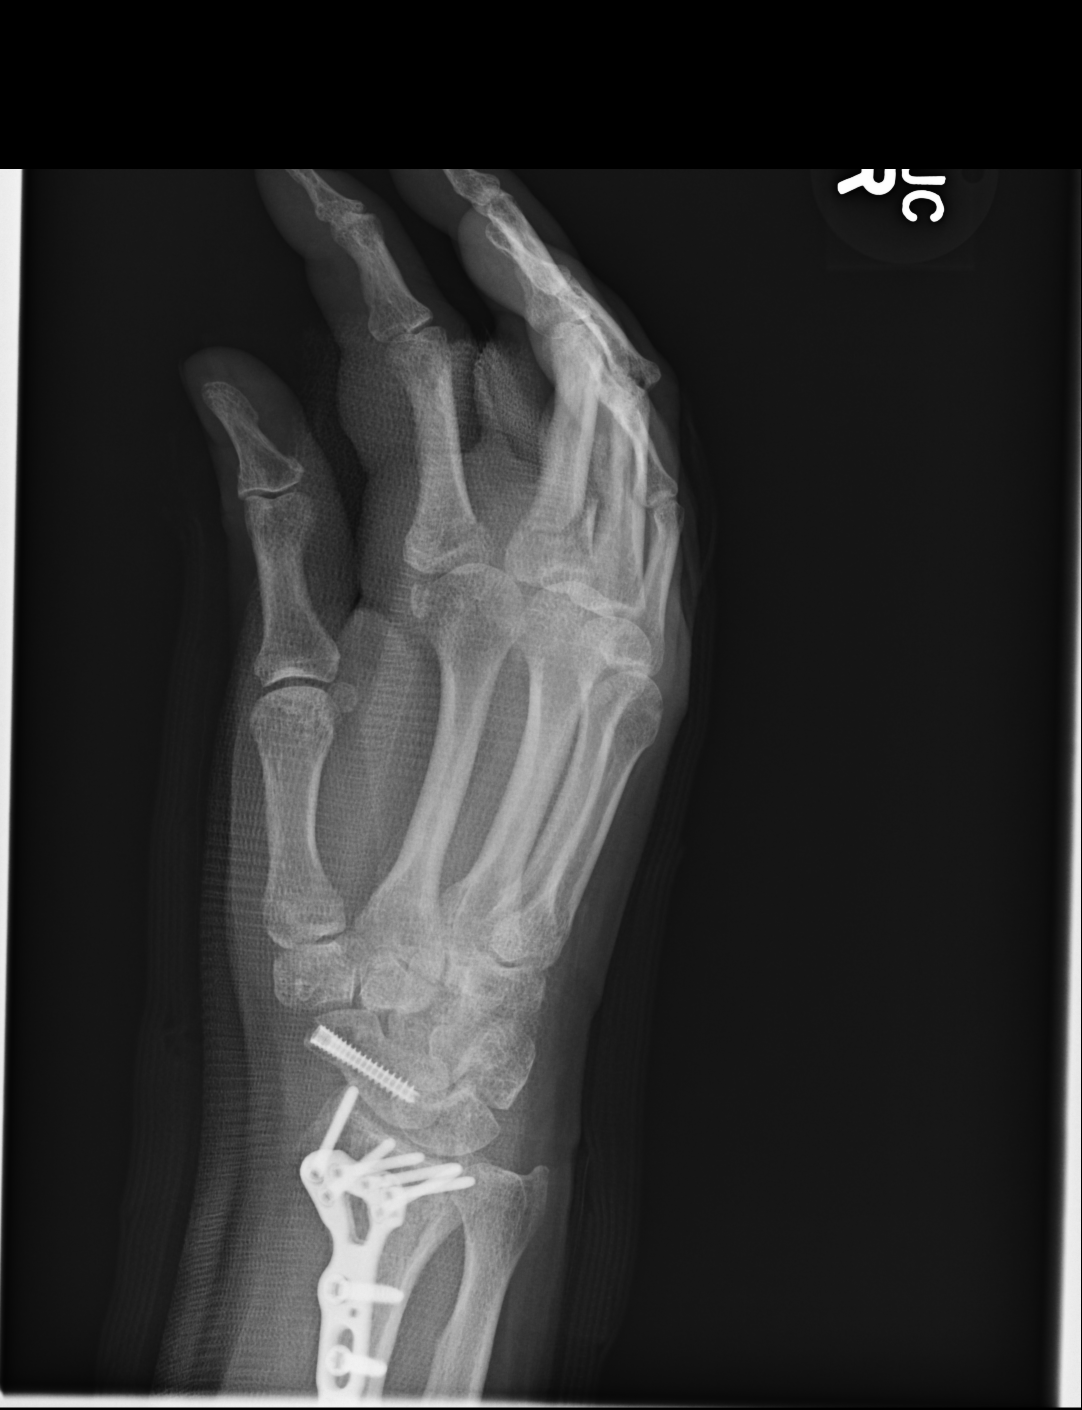
[im 2/3]
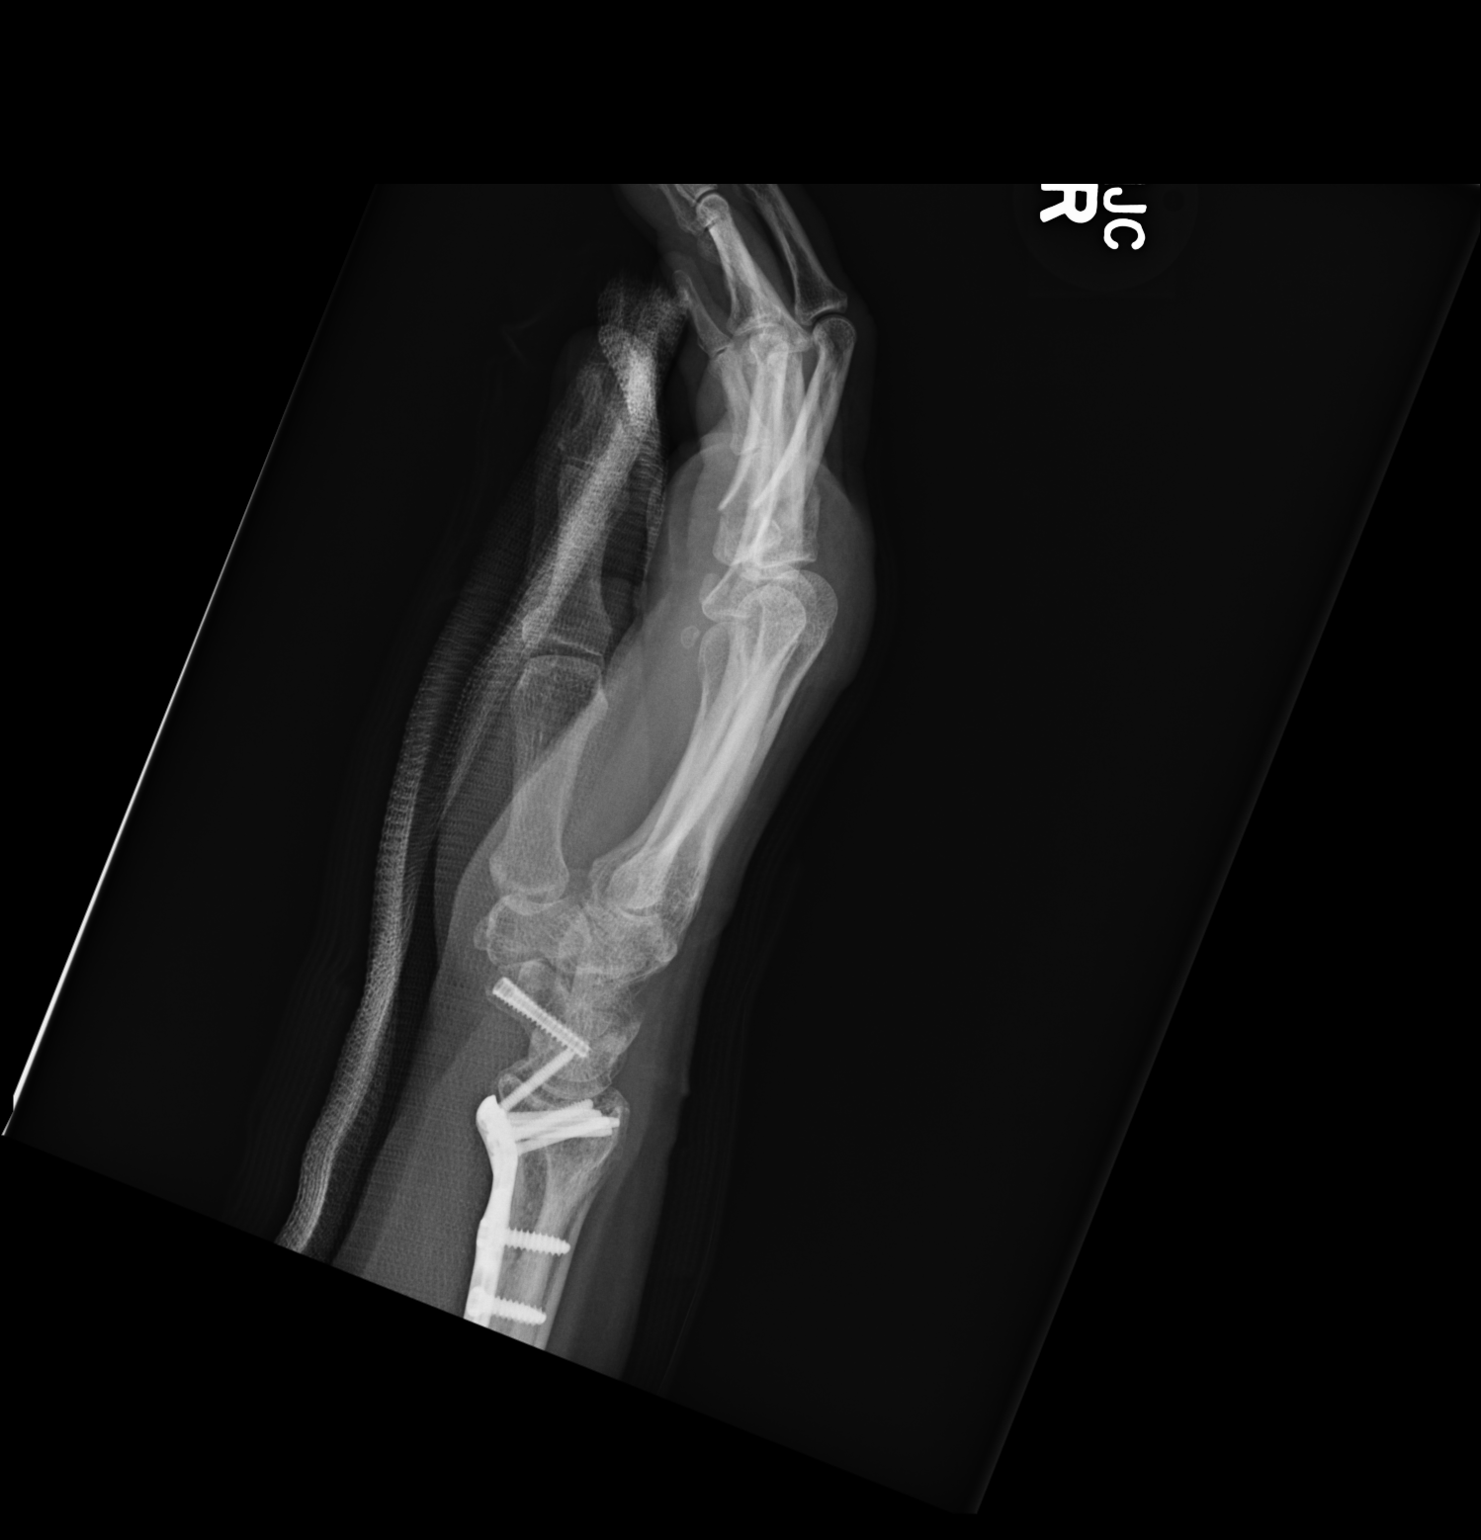
[im 3/3]
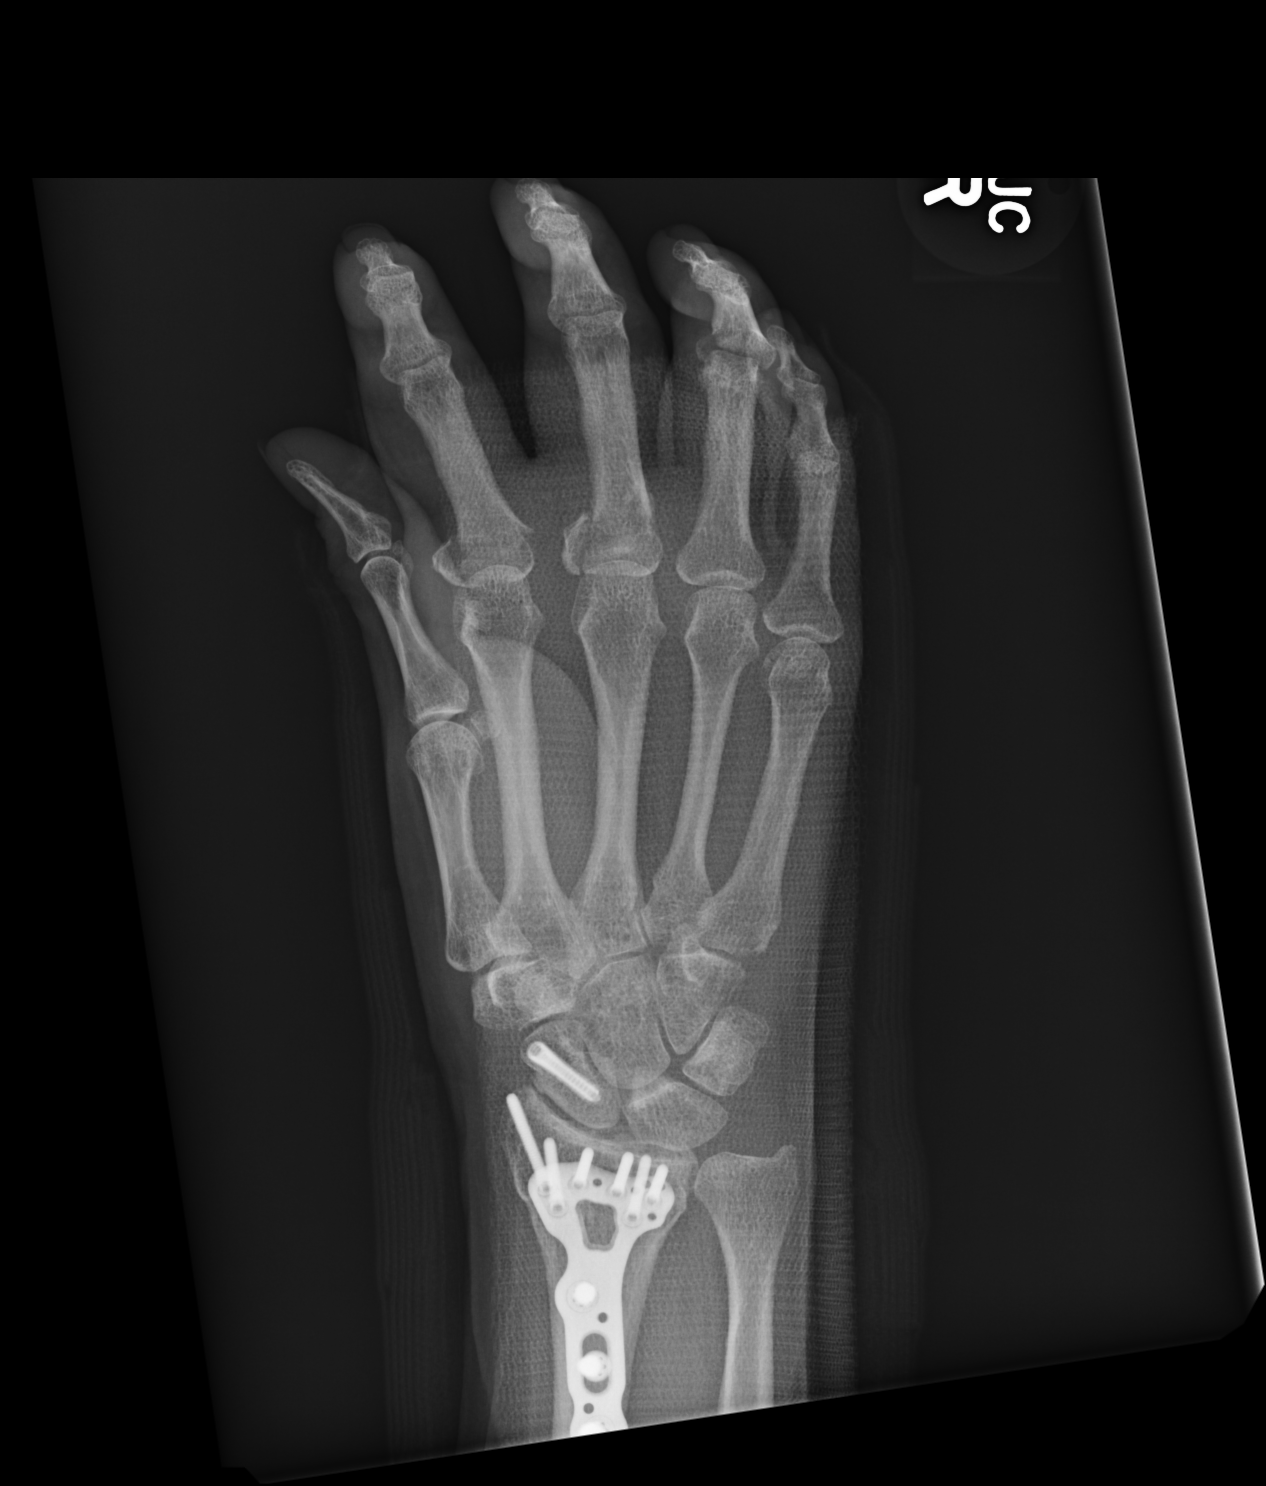

[3 of 3 positions shown; findings below may reference images not displayed]

FINDINGS: Three views of the right hand submitted. Postreduction there is
improvement in alignment of comminuted fracture at the base of
proximal phalanx second and third finger. Improvement in alignment
of the fracture at the base of middle phalanx third finger. Again
noted postsurgical changes distal radius.
IMPRESSION: Postreduction there is improvement in alignment of comminuted
fractures at the base of proximal phalanx second and third finger.
Improvement in alignment of the fracture at the base of middle
phalanx third finger.

## 2017-03-01 IMAGING — CT CT FINGERS*R* W/O CM
3 of 5 series · 13 of 36 positions shown, 15 images · non-contrast
Comparison: Radiographs 08/31/2015

CLINICAL DATA: Followup hand fractures.

EXAM:
CT OF THE RIGHT FINGERS WITHOUT CONTRAST
TECHNIQUE: Multidetector CT imaging was performed according to the standard
protocol. Multiplanar CT image reconstructions were also generated.

[Series 5: hand bone thins · axial · 0.12mm/px · z∈[+1272,+1409]mm · 6 of 321 slices shown, 8 images]
[im 46/321  soft-tissue]
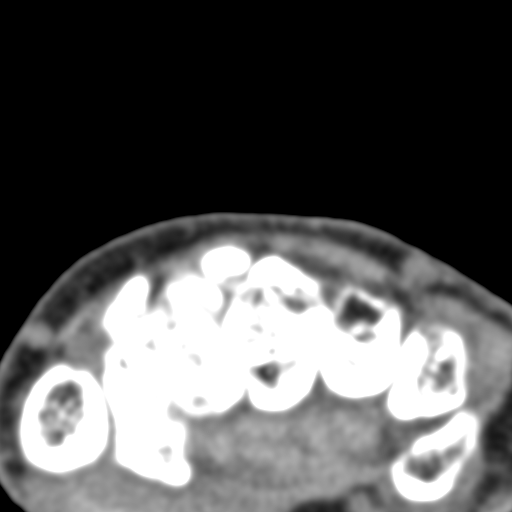
[im 46/321  bone]
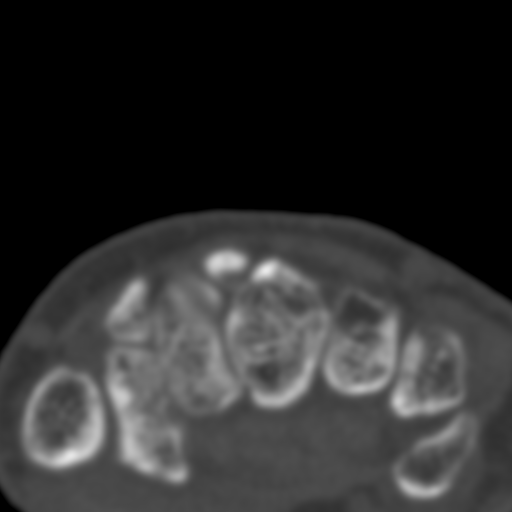
[im 92/321  bone]
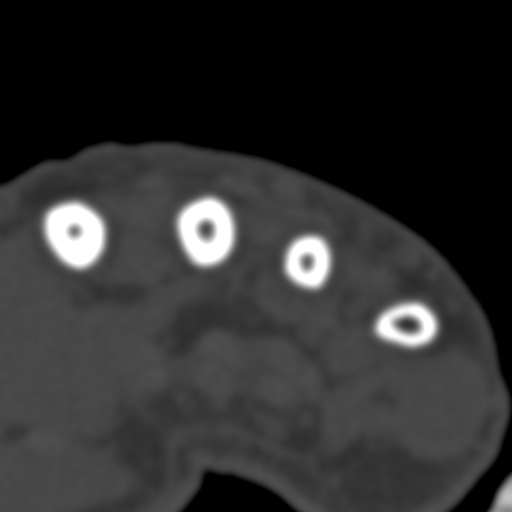
[im 138/321  bone]
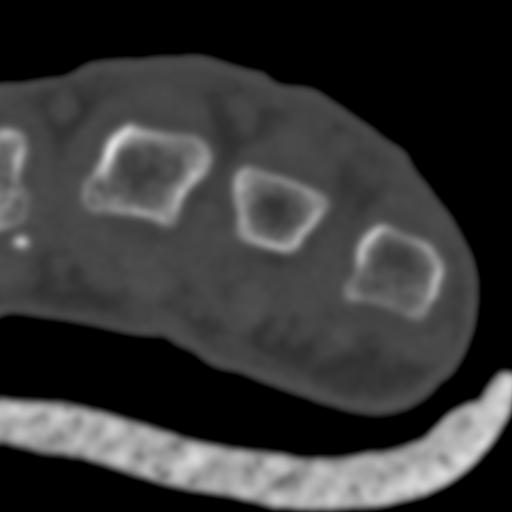
[im 183/321  bone]
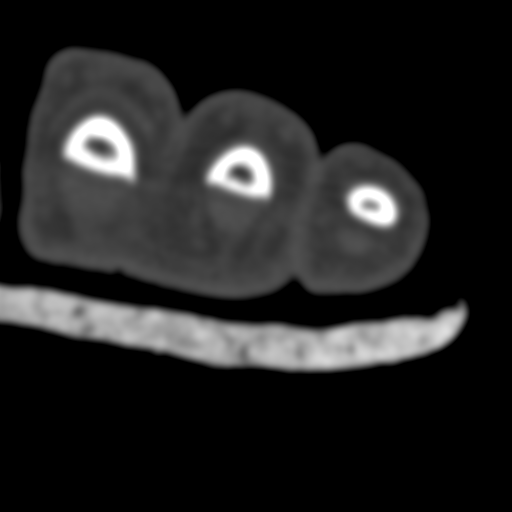
[im 229/321  soft-tissue]
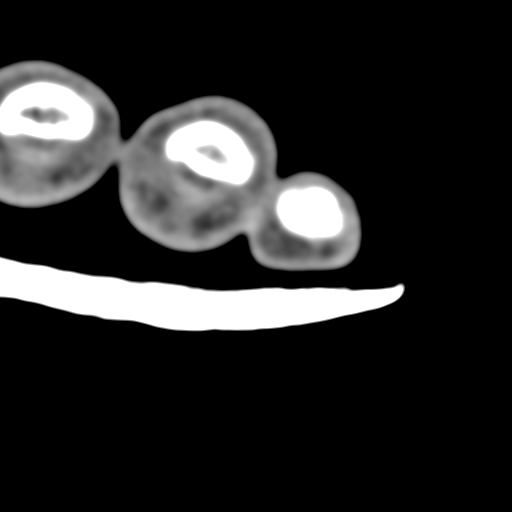
[im 229/321  bone]
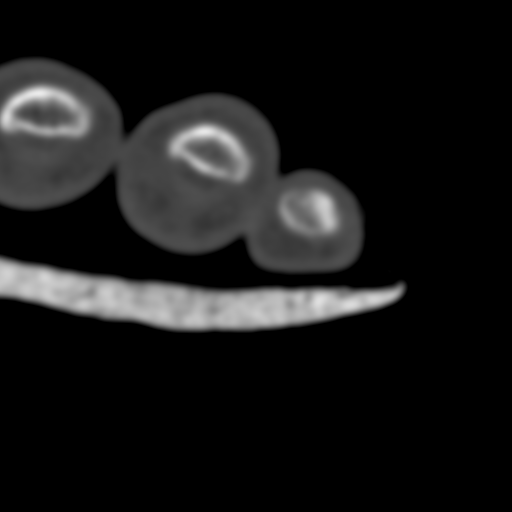
[im 275/321  bone]
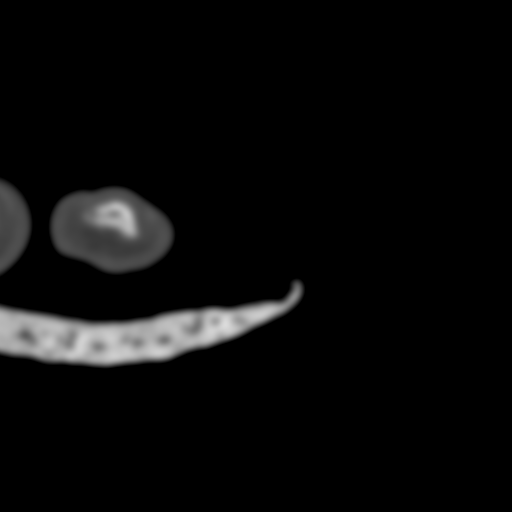

[Series 6: hand st axial · axial · 0.12mm/px · z∈[+1283,+1398]mm · 4 of 275 slices shown (1 of 2)]
[im 55/275  bone]
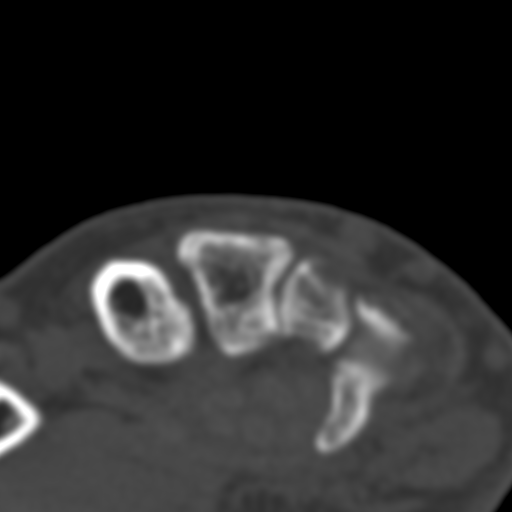
[im 110/275  bone]
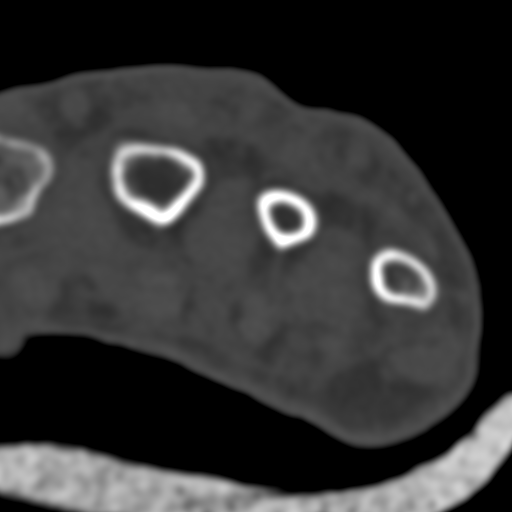
[im 165/275  bone]
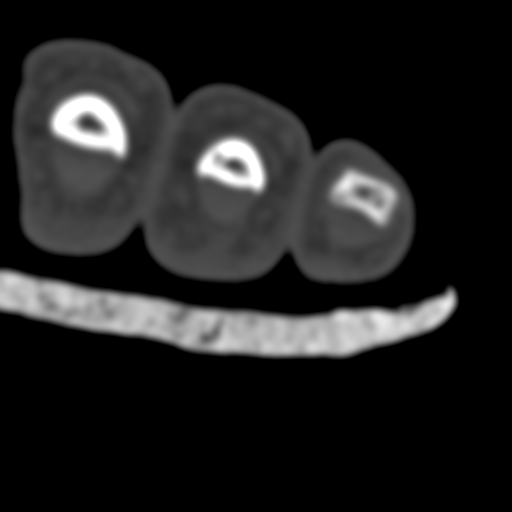
[im 220/275  bone]
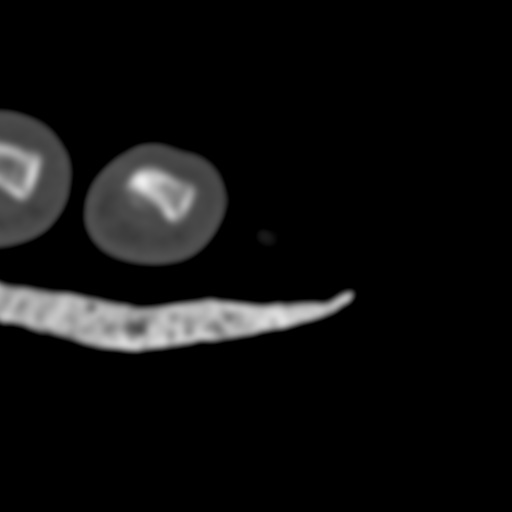

[Series 9: hand st axial · coronal · 0.16mm/px · 3 of 107 slices shown (2 of 2)]
[im 22/107  bone]
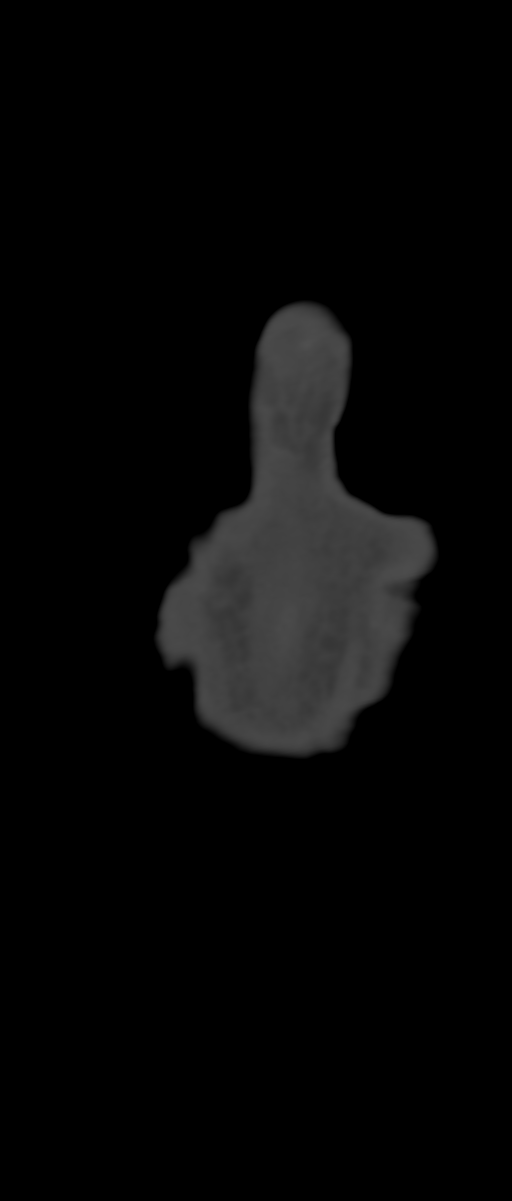
[im 43/107  bone]
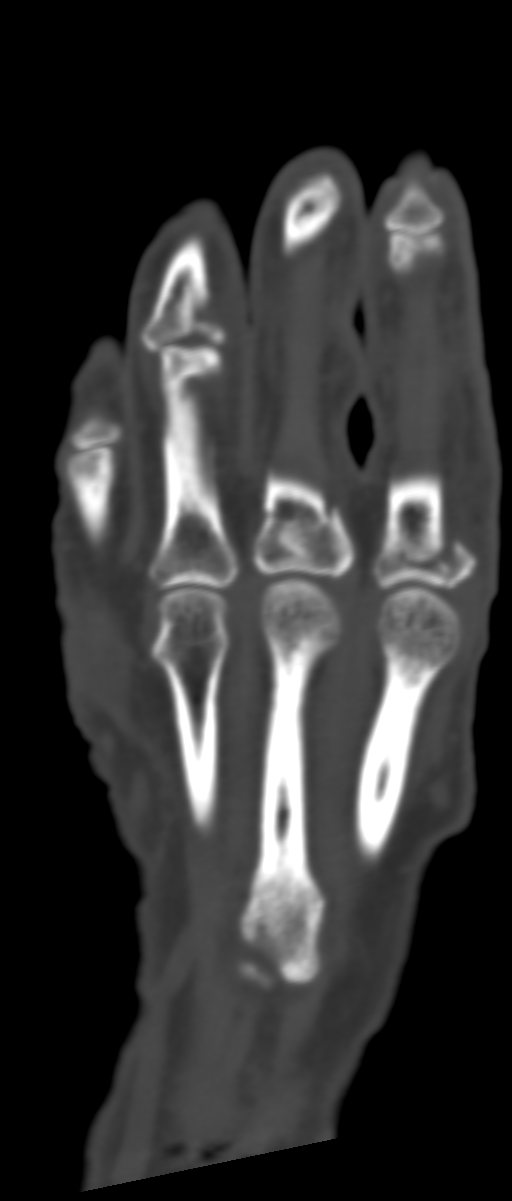
[im 64/107  bone]
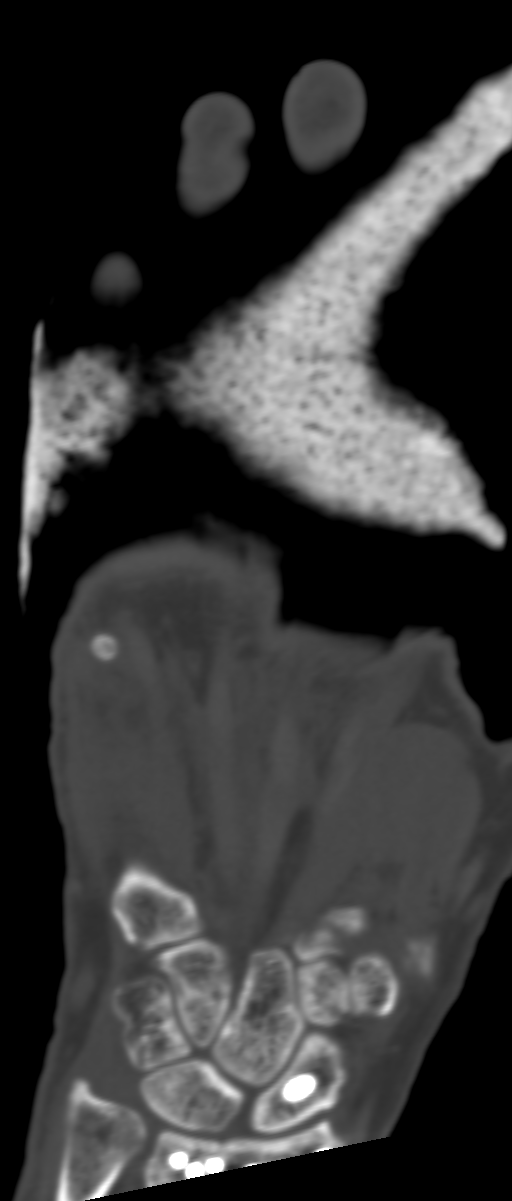

[13 of 36 positions shown; findings below may reference images not displayed]

FINDINGS: Index finger: Stable displaced fracture at the base of the proximal
phalanx. Mild palmar displacement. Some healing changes are noted
along the ulnar aspect of the fracture with some bony ingrowth. The
mid and radial aspect the fracture is largely ununited.

Middle finger: Stable displaced fracture of the proximal phalanx
with mild palmar displacement. Some areas of bony ingrowth are noted
at the fracture site.

Ring finger: Comminuted intra-articular fracture involving the
middle phalanx at the PIP joint. Dorsal and volar plate segments are
noted. No healing of the displaced volar plate segment. Some
interval healing of the dorsal plate segment.

Hardware noted in the distal radius and also a screw is noted in the
navicular bone. This appears healed.
IMPRESSION: Healing hand fractures as above.

## 2017-08-04 NOTE — Congregational Nurse Program (Signed)
Congregational Nurse Program Note  Date of Encounter: 08/04/2017  Past Medical History: Past Medical History:  Diagnosis Date  . Alcohol dependence (HCC)   . Anxiety   . Back pain   . Bipolar 1 disorder (HCC)   . Depression   . Diabetes mellitus    supposed to take metformin but does not take  . Diabetes mellitus without complication (HCC)   . Drug-seeking behavior   . H/O suicide attempt   . High cholesterol   . Hypertension   . Liver disease   . Polysubstance abuse [facility](HCC)     Encounter Details: CNP Questionnaire - 08/04/17 0945      Questionnaire   Patient Status  Not Applicable    Race  White or Caucasian    [facility] [place]    Insurance  Not Applicable    Uninsured  Uninsured (NEW 1x/quarter)    Food  No food insecurities    Housing/Utilities  No permanent housing    Transportation  No transportation needs    Interpersonal Safety  Yes, feel physically and emotionally safe where you currently live    Medication  No medication insecurities    Medical Provider  Yes    Referrals  [employer], [employer] Connect[employer]    ED Visit Averted  Not Applicable    Life-Saving Intervention Made  Not Applicable     Seen at the food pantry.Stated she is homeless and presently staying at a shelter.working on getting her identification  Card B P 112/74 - P 78.[facility] Jenene Slickermma Ariell Gunnels RN, [place] [facility] [phone].

## 2017-08-09 DIAGNOSIS — I10 Essential (primary) hypertension: Secondary | ICD-10-CM | POA: Insufficient documentation

## 2017-08-09 DIAGNOSIS — E119 Type 2 diabetes mellitus without complications: Secondary | ICD-10-CM | POA: Insufficient documentation

## 2017-08-09 DIAGNOSIS — Z79899 Other long term (current) drug therapy: Secondary | ICD-10-CM | POA: Insufficient documentation

## 2017-08-09 DIAGNOSIS — R102 Pelvic and perineal pain: Secondary | ICD-10-CM | POA: Insufficient documentation

## 2017-08-09 DIAGNOSIS — E7800 Pure hypercholesterolemia, unspecified: Secondary | ICD-10-CM | POA: Insufficient documentation

## 2017-08-09 DIAGNOSIS — R1030 Lower abdominal pain, unspecified: Secondary | ICD-10-CM | POA: Insufficient documentation

## 2017-08-09 DIAGNOSIS — F17210 Nicotine dependence, cigarettes, uncomplicated: Secondary | ICD-10-CM | POA: Insufficient documentation

## 2017-08-09 DIAGNOSIS — Z7984 Long term (current) use of oral hypoglycemic drugs: Secondary | ICD-10-CM | POA: Insufficient documentation

## 2017-08-10 ENCOUNTER — Emergency Department (HOSPITAL_COMMUNITY): Payer: Self-pay

## 2017-08-10 ENCOUNTER — Other Ambulatory Visit: Payer: Self-pay

## 2017-08-10 ENCOUNTER — Encounter (HOSPITAL_COMMUNITY): Payer: Self-pay | Admitting: *Deleted

## 2017-08-10 ENCOUNTER — Emergency Department (HOSPITAL_COMMUNITY)
Admission: EM | Admit: 2017-08-10 | Discharge: 2017-08-10 | Disposition: A | Discharge status: Home / Self Care | Payer: Self-pay | Attending: Emergency Medicine | Admitting: Emergency Medicine

## 2017-08-10 DIAGNOSIS — R102 Pelvic and perineal pain: Secondary | ICD-10-CM

## 2017-08-10 DIAGNOSIS — R1030 Lower abdominal pain, unspecified: Secondary | ICD-10-CM

## 2017-08-10 LAB — CBC WITH DIFFERENTIAL/PLATELET
BASOS ABS: 0 10*3/uL (ref 0.0–0.1)
Basophils Relative: 0 %
Eosinophils Absolute: 0.1 10*3/uL (ref 0.0–0.7)
Eosinophils Relative: 1 %
HEMATOCRIT: 38.6 % (ref 36.0–46.0)
HEMOGLOBIN: 12.8 g/dL (ref 12.0–15.0)
LYMPHS PCT: 37 %
Lymphs Abs: 2.7 10*3/uL (ref 0.7–4.0)
MCH: 29.6 pg (ref 26.0–34.0)
MCHC: 33.2 g/dL (ref 30.0–36.0)
MCV: 89.1 fL (ref 78.0–100.0)
MONO ABS: 0.5 10*3/uL (ref 0.1–1.0)
MONOS PCT: 7 %
NEUTROS ABS: 4 10*3/uL (ref 1.7–7.7)
NEUTROS PCT: 55 %
Platelets: 289 10*3/uL (ref 150–400)
RBC: 4.33 MIL/uL (ref 3.87–5.11)
RDW: 17.2 % — AB (ref 11.5–15.5)
WBC: 7.3 10*3/uL (ref 4.0–10.5)

## 2017-08-10 LAB — COMPREHENSIVE METABOLIC PANEL
ALK PHOS: 98 U/L (ref 38–126)
ALT: 18 U/L (ref 0–44)
AST: 21 U/L (ref 15–41)
Albumin: 3.8 g/dL (ref 3.5–5.0)
Anion gap: 12 (ref 5–15)
BILIRUBIN TOTAL: 0.6 mg/dL (ref 0.3–1.2)
BUN: 5 mg/dL — AB (ref 6–20)
CALCIUM: 8.9 mg/dL (ref 8.9–10.3)
CO2: 20 mmol/L — ABNORMAL LOW (ref 22–32)
CREATININE: 0.47 mg/dL (ref 0.44–1.00)
Chloride: 102 mmol/L (ref 98–111)
GFR calc Af Amer: >60 mL/min (ref >60)
GLUCOSE: 114 mg/dL — AB (ref 70–99)
POTASSIUM: 3.6 mmol/L (ref 3.5–5.1)
Sodium: 134 mmol/L — ABNORMAL LOW (ref 135–145)
TOTAL PROTEIN: 7.9 g/dL (ref 6.5–8.1)

## 2017-08-10 LAB — URINALYSIS, ROUTINE W REFLEX MICROSCOPIC
BILIRUBIN URINE: NEGATIVE
GLUCOSE, UA: NEGATIVE mg/dL
HGB URINE DIPSTICK: NEGATIVE
KETONES UR: NEGATIVE mg/dL
LEUKOCYTES UA: NEGATIVE
Nitrite: NEGATIVE
Protein, ur: NEGATIVE mg/dL
Specific Gravity, Urine: 1.004 — ABNORMAL LOW (ref 1.005–1.030)
pH: 5 (ref 5.0–8.0)

## 2017-08-10 LAB — WET PREP, GENITAL
Clue Cells Wet Prep HPF POC: NONE SEEN
SPERM: NONE SEEN
Trich, Wet Prep: NONE SEEN
Yeast Wet Prep HPF POC: NONE SEEN

## 2017-08-10 LAB — CBG MONITORING, ED: Glucose-Capillary: 189 mg/dL — ABNORMAL HIGH (ref 70–99)

## 2017-08-10 LAB — I-STAT BETA HCG BLOOD, ED (MC, WL, AP ONLY): I-stat hCG, quantitative: <5 m[IU]/mL (ref <5)

## 2017-08-10 LAB — LIPASE, BLOOD: LIPASE: 39 U/L (ref 11–51)

## 2017-08-10 MED ORDER — HYDROMORPHONE HCL 1 MG/ML IJ SOLN
1.0000 mg | Freq: Once | INTRAMUSCULAR | Status: AC
  Administered 2017-08-10: 1 mg via INTRAVENOUS
  Filled 2017-08-10: qty 1

## 2017-08-10 MED ORDER — ONDANSETRON HCL 4 MG/2ML IJ SOLN
4.0000 mg | Freq: Once | INTRAMUSCULAR | Status: AC
  Administered 2017-08-10: 4 mg via INTRAVENOUS

## 2017-08-10 MED ORDER — IBUPROFEN 800 MG PO TABS: 800.0000 mg | ORAL_TABLET | Freq: Three times a day (TID) | ORAL | 0 refills | Status: DC | PRN

## 2017-08-10 MED ORDER — IOPAMIDOL (ISOVUE-300) INJECTION 61%
100.0000 mL | Freq: Once | INTRAVENOUS | Status: AC | PRN
  Administered 2017-08-10: 100 mL via INTRAVENOUS

## 2017-08-10 MED ORDER — SODIUM CHLORIDE 0.9 % IV BOLUS
1000.0000 mL | Freq: Once | INTRAVENOUS | Status: AC
  Administered 2017-08-10: 1000 mL via INTRAVENOUS

## 2017-08-10 MED ORDER — DICYCLOMINE HCL 20 MG PO TABS: 20.0000 mg | ORAL_TABLET | Freq: Two times a day (BID) | ORAL | 0 refills | Status: DC

## 2017-08-10 MED ORDER — ONDANSETRON HCL 4 MG/2ML IJ SOLN
INTRAMUSCULAR | Status: AC
  Filled 2017-08-10: qty 2

## 2017-08-10 NOTE — ED Notes (Signed)
Pt wondering hallway, this nurse asked if she was looking for the bathroom. Pt states she wanted to go outside. Pt told she could not go outside with her IV in her arm.  Pt found to be outside 10 minutes later smoking. This nurse went outside with security and removed patient's IV. Pt returned into hospital building with her significant other.

## 2017-08-10 NOTE — ED Triage Notes (Signed)
Pt c/o abdominal pain with n/v/d x 4 days and states her cbg's have been running high

## 2017-08-10 NOTE — ED Provider Notes (Signed)
[facility] EMERGENCY DEPARTMENT Provider Note   CSN: [id number] Arrival date & time: 08/09/17  2351     History   Chief Complaint Chief Complaint  Patient presents with  . Abdominal Pain    HPI Julie Burnett is a 47 y.o. female.  Patient presents to the emergency department for evaluation of abdominal pain.  Patient reports that symptoms have been ongoing for for 5 days.  She thinks that it is her ovaries.  She has bilateral low abdominal and pelvic pain.  She has not had unusual vaginal bleeding or discharge.  She denies urinary symptoms.  She has had nausea associated with the symptoms.  She has chronic diarrhea from her metformin, this has not changed.  No constipation.  She has not noticed any fever.     Past Medical History:  Diagnosis Date  . Alcohol dependence (HCC)   . Anxiety   . Back pain   . Bipolar 1 disorder (HCC)   . Depression   . Diabetes mellitus    supposed to take metformin but does not take  . Diabetes mellitus without complication (HCC)   . Drug-seeking behavior   . H/O suicide attempt   . High cholesterol   . Hypertension   . Liver disease   . Polysubstance abuse [facility])     Patient Active Problem List   Diagnosis Date Noted  . MDD (major depressive disorder) 03/05/2015  . Major depressive disorder, recurrent episode, severe, without mention of psychotic behavior 07/25/2012  . Alcohol withdrawal (HCC) 07/25/2012  . Alcohol dependence (HCC) 07/25/2012  . Opioid dependence (HCC) 11/12/2011  . Polysubstance dependence (HCC) 11/12/2011  . Diabetes mellitus (HCC) 11/12/2011    Past Surgical History:  Procedure Laterality Date  . BACK SURGERY    . CLOSED REDUCTION FINGER WITH PERCUTANEOUS PINNING Right 09/17/2015   Procedure: Right Index and Long Finger CLOSED REDUCTION AND  PERCUTANEOUS PINNING OF FRACTURES;  Surgeon: Betha Loa, MD;  Location: [facility];  Service: Orthopedics;  Laterality: Right;  Right Index and Long Finger  CLOSED REDUCTION AND  PERCUTANEOUS PINNING OF FRACTURES  . OPEN REDUCTION INTERNAL FIXATION (ORIF) DISTAL RADIAL FRACTURE Right 05/16/2015   Procedure: RIGHT OPEN REDUCTION INTERNAL FIXATION (ORIF) DISTAL RADIUS AND SCAPHOID ORIF;  Surgeon: Betha Loa, MD;  Location: [facility];  Service: Orthopedics;  Laterality: Right;  . OPEN REDUCTION INTERNAL FIXATION (ORIF) METACARPAL Right 09/17/2015   Procedure: OPEN REDUCTION EXTERNAL FIXATION (ORIF) OF RIGHT RING FINGER;  Surgeon: Betha Loa, MD;  Location: [facility];  Service: Orthopedics;  Laterality: Right;  OPEN REDUCTION EXTERNAL FIXATION (ORIF) OF RIGHT RING FINGER  . ORIF SCAPHOID FRACTURE Right 05/16/2015   Procedure: RIGHT OPEN REDUCTION INTERNAL FIXATION (ORIF) SCAPHOID FRACTURE;  Surgeon: Betha Loa, MD;  Location: [facility];  Service: Orthopedics;  Laterality: Right;     OB History   None      Home Medications    Prior to Admission medications   Medication Sig Start Date End Date Taking? Authorizing Provider  amphetamine-dextroamphetamine (ADDERALL) 20 MG tablet Take 20 mg by mouth daily.    [provider]  dicyclomine (BENTYL) 20 MG tablet Take 1 tablet (20 mg total) by mouth 2 (two) times daily. 08/10/17   Gilda Crease, MD  ibuprofen (ADVIL,MOTRIN) 800 MG tablet Take 1 tablet (800 mg total) by mouth every 8 (eight) hours as needed for moderate pain or cramping. 08/10/17   Angel Weedon, Canary Brim, MD  metFORMIN (GLUCOPHAGE)  500 MG tablet Take 500 mg by mouth 2 (two) times daily with a meal.    [provider]  methocarbamol (ROBAXIN) 500 MG tablet Take 1 or 2 po Q 6hrs for muscle pain 01/06/17   Devoria Albe, MD  Oxycodone HCl 10 MG TABS Take 10 mg by mouth 3 (three) times daily.    [provider]    Family History History reviewed. No pertinent family history.  Social History Social History   Tobacco Use  . Smoking status: Current Every Day Smoker      Packs/day: 1.00    Years: 17.00    Pack years: 17.00    Types: Cigarettes  . Smokeless tobacco: Never Used  Substance Use Topics  . Alcohol use: No    Alcohol/week: 3.6 oz    Types: 6 Cans of beer per week    Frequency: Never    Comment: 6-12 pack daily; usually 12 oz  . Drug use: Yes    Types: Marijuana, "Crack" cocaine    Comment: occ     Allergies   Hydrocodone; Naprosyn [naproxen]; and Codeine   Review of Systems Review of Systems  Gastrointestinal: Positive for abdominal pain, diarrhea and nausea.  Genitourinary: Positive for pelvic pain.  All other systems reviewed and are negative.    Physical Exam Updated Vital Signs BP 112/62 (BP Location: Right Arm)   Pulse 88   Temp (!) 97.5 F (36.4 C) (Oral) Comment: Pt recently smoked and drank water  Resp 18   Ht 5' 1.5" (1.562 m)   Wt 72.6 kg (160 lb)   LMP 07/07/2017   SpO2 97%   BMI 29.74 kg/m   Physical Exam  Constitutional: She is oriented to person, place, and time. She appears well-developed and well-nourished. No distress.  HENT:  Head: Normocephalic and atraumatic.  Right Ear: Hearing normal.  Left Ear: Hearing normal.  Nose: Nose normal.  Mouth/Throat: Oropharynx is clear and moist and mucous membranes are normal.  Eyes: Pupils are equal, round, and reactive to light. Conjunctivae and EOM are normal.  Neck: Normal range of motion. Neck supple.  Cardiovascular: Regular rhythm, S1 normal and S2 normal. Exam reveals no gallop and no friction rub.  No murmur heard. Pulmonary/Chest: Effort normal and breath sounds normal. No respiratory distress. She exhibits no tenderness.  Abdominal: Soft. Normal appearance and bowel sounds are normal. There is no hepatosplenomegaly. There is tenderness in the right lower quadrant, suprapubic area and left lower quadrant. There is no rebound, no guarding, no tenderness at McBurney's point and negative Murphy's sign. No hernia.  Musculoskeletal: Normal range of motion.   Neurological: She is alert and oriented to person, place, and time. She has normal strength. No cranial nerve deficit or sensory deficit. Coordination normal. GCS eye subscore is 4. GCS verbal subscore is 5. GCS motor subscore is 6.  Skin: Skin is warm, dry and intact. No rash noted. No cyanosis.  Psychiatric: She has a normal mood and affect. Her speech is normal and behavior is normal. Thought content normal.  Nursing note and vitals reviewed.    ED Treatments / Results  Labs (all labs ordered are listed, but only abnormal results are displayed) Labs Reviewed  WET PREP, GENITAL - Abnormal; Notable for the following components:      Result Value   WBC, Wet Prep HPF POC FEW (*)    All other components within normal limits  CBC WITH DIFFERENTIAL/PLATELET - Abnormal; Notable for the following components:   RDW  17.2 (*)    All other components within normal limits  COMPREHENSIVE METABOLIC PANEL - Abnormal; Notable for the following components:   Sodium 134 (*)    CO2 20 (*)    Glucose, Bld 114 (*)    BUN 5 (*)    All other components within normal limits  URINALYSIS, ROUTINE W REFLEX MICROSCOPIC - Abnormal; Notable for the following components:   Color, Urine STRAW (*)    Specific Gravity, Urine 1.004 (*)    All other components within normal limits  CBG MONITORING, ED - Abnormal; Notable for the following components:   Glucose-Capillary 189 (*)    All other components within normal limits  LIPASE, BLOOD  I-STAT BETA HCG BLOOD, ED ([facility], [facility], [facility] ONLY)  GC/CHLAMYDIA PROBE AMP ([place]) NOT AT [facility]    EKG None  Radiology Ct Abdomen Pelvis W Contrast  Result Date: 08/10/2017 CLINICAL DATA:  Acute generalized abdominal pain. EXAM: CT ABDOMEN AND PELVIS WITH CONTRAST TECHNIQUE: Multidetector CT imaging of the abdomen and pelvis was performed using the standard protocol following bolus administration of intravenous contrast. CONTRAST:  100mL ISOVUE-300 IOPAMIDOL (ISOVUE-300)  INJECTION 61% COMPARISON:  Radiographs earlier this day. FINDINGS: Lower chest: Lung bases are clear. Hepatobiliary: Decreased hepatic density consistent with steatosis. No focal lesion. Gallbladder physiologically distended, no calcified stone. No biliary dilatation. Pancreas: No ductal dilatation or inflammation. Spleen: Normal in size without focal abnormality. Small splenule inferiorly. Adrenals/Urinary Tract: Normal adrenal glands. No hydronephrosis or perinephric edema. Homogeneous renal enhancement with symmetric excretion on delayed phase imaging. Urinary bladder is completely decompressed. Stomach/Bowel: Stomach physiologically distended. Small duodenal diverticulum. No bowel obstruction, wall thickening, or inflammatory change. Mild distal colonic diverticulosis without diverticulitis. Normal appendix. Vascular/Lymphatic: Prominent left adnexal vascularity and prominence of the ovarian vein measuring 6 mm. Mild noncalcified atheromatous plaque in the abdominal aorta. No enlarged abdominal or pelvic lymph nodes. Reproductive: Prominent left adnexal vascularity and dilatation of the ovarian vein. Peripherally enhancing cyst in the left ovary likely a corpus luteum. Uterus and right ovary are unremarkable. Other: No free air, free fluid, or intra-abdominal fluid collection. Musculoskeletal: There are no acute or suspicious osseous abnormalities. IMPRESSION: 1. No acute findings or explanation for abdominal pain. 2. Prominent left adnexal vascularity and dilatation of the ovarian vein at 6 mm, can be seen with pelvic congestion syndrome. 3. Hepatic steatosis and mild colonic diverticulosis. Electronically Signed   By: Rubye OaksMelanie  Ehinger M.D.   On: 08/10/2017 05:50   Dg Abd Acute W/chest  Result Date: 08/10/2017 CLINICAL DATA:  Abdominal pain, nausea, vomiting and diarrhea for 4 days. History of diabetes, liver disease, polysubstance abuse. EXAM: DG ABDOMEN ACUTE W/ 1V CHEST COMPARISON:  Chest radiograph  March 05, 2015 FINDINGS: Cardiomediastinal silhouette is normal. Lungs are clear, no pleural effusions. No pneumothorax. Soft tissue planes and included osseous structures are unremarkable. Bowel gas pattern is nondilated and nonobstructive. Small volume retained large bowel stool. Possible hepatic granuloma. No intra-abdominal mass effect, pathologic calcifications or free air. Phleboliths in the pelvis. Soft tissue planes and included osseous structures are non-suspicious. IMPRESSION: Normal bowel gas pattern.  No acute cardiopulmonary process. Electronically Signed   By: Awilda Metroourtnay  Bloomer M.D.   On: 08/10/2017 04:31    Procedures Procedures (including critical care time)  Medications Ordered in ED Medications  sodium chloride 0.9 % bolus 1,000 mL (0 mLs Intravenous Stopped 08/10/17 0331)  ondansetron (ZOFRAN) injection 4 mg (4 mg Intravenous Given 08/10/17 0201)  HYDROmorphone (DILAUDID) injection 1 mg (1 mg Intravenous  Given 08/10/17 0459)  iopamidol (ISOVUE-300) 61 % injection 100 mL (100 mLs Intravenous Contrast Given 08/10/17 0503)     Initial Impression / Assessment and Plan / ED Course  I have reviewed the triage vital signs and the nursing notes.  Pertinent labs & imaging results that were available during my care of the patient were reviewed by me and considered in my medical decision making (see chart for details).     Patient presents to the emergency department for evaluation of abdominal and pelvic pain.  Patient reports that the pain is bilateral.  She had tenderness in the right lower, left lower and suprapubic area of her abdominal exam.  Pelvic exam was unremarkable.  Lab work was essentially normal.  Patient administered analgesia, IV fluids.  Repeat examination revealed that she still had significant tenderness.  She therefore underwent CT scan.  CT scan was unremarkable.  She does have a prominent left adnexal vascularity and dilatation of the ovarian vein, but it is  unclear if this is related to her pain.  She does not require any urgent intervention for this.  As her blood work and CT scan were unremarkable, she can be discharged, follow-up with her PCP.  Final Clinical Impressions(s) / ED Diagnoses   Final diagnoses:  Lower abdominal pain  Pelvic pain in female    ED Discharge Orders        Ordered    ibuprofen (ADVIL,MOTRIN) 800 MG tablet  Every 8 hours PRN     08/10/17 0619    dicyclomine (BENTYL) 20 MG tablet  2 times daily     08/10/17 1610       Gilda Crease, MD 08/11/17 [phone]

## 2017-08-11 LAB — GC/CHLAMYDIA PROBE AMP (~~LOC~~) NOT AT ARMC
CHLAMYDIA, DNA PROBE: NEGATIVE
Neisseria Gonorrhea: NEGATIVE

## 2017-08-19 ENCOUNTER — Telehealth: Payer: Self-pay

## 2017-08-19 NOTE — Congregational Nurse Program (Signed)
Congregational Nurse Program Note  Date of Encounter: 08/19/2017  Past Medical History: Past Medical History:  Diagnosis Date  . Alcohol dependence (HCC)   . Anxiety   . Back pain   . Bipolar 1 disorder (HCC)   . Depression   . Diabetes mellitus    supposed to take metformin but does not take  . Diabetes mellitus without complication (HCC)   . Drug-seeking behavior   . H/O suicide attempt   . High cholesterol   . Hypertension   . Liver disease   . Polysubstance abuse [facility](HCC)     Encounter Details: CNP Questionnaire - 08/04/17 0945      Questionnaire   Patient Status  Not Applicable    Race  White or Caucasian    Location Patient Served At  [facility] [employer] [place]    Insurance  Not Applicable    Uninsured  Uninsured (NEW 1x/quarter)    Food  No food insecurities    Housing/Utilities  No permanent housing    Transportation  No transportation needs    Interpersonal Safety  Yes, feel physically and emotionally safe where you currently live    Medication  No medication insecurities    Medical Provider  Yes    Referrals  [employer] officer, [employer]    ED Visit Averted  Not Applicable    Life-Saving Intervention Made  Not Applicable     [facility] [facility].Stated she  is homeless but presently staying with a friend  Very upset with staff about food.Told she would need to go to police department and get homeless ID. B P [phone] P [address] Tsuyako Jolley RN, [address] [place] [facility], [phone]

## 2017-09-15 NOTE — Telephone Encounter (Signed)
Seen at the food pantry. No complaints B P 113/76 P [address] RN, [address], [phone]

## 2018-04-04 ENCOUNTER — Other Ambulatory Visit (HOSPITAL_COMMUNITY): Payer: Self-pay | Admitting: Nurse Practitioner

## 2018-04-04 DIAGNOSIS — Z1231 Encounter for screening mammogram for malignant neoplasm of breast: Secondary | ICD-10-CM

## 2018-04-05 ENCOUNTER — Other Ambulatory Visit: Payer: Self-pay | Admitting: Nurse Practitioner

## 2018-04-05 ENCOUNTER — Other Ambulatory Visit (HOSPITAL_COMMUNITY): Payer: Self-pay | Admitting: Nurse Practitioner

## 2018-04-05 DIAGNOSIS — R102 Pelvic and perineal pain: Secondary | ICD-10-CM

## 2018-04-05 DIAGNOSIS — N852 Hypertrophy of uterus: Secondary | ICD-10-CM

## 2018-04-05 DIAGNOSIS — N921 Excessive and frequent menstruation with irregular cycle: Secondary | ICD-10-CM

## 2018-05-06 ENCOUNTER — Ambulatory Visit (HOSPITAL_COMMUNITY): Admission: RE | Admit: 2018-05-06 | Payer: Self-pay | Source: Ambulatory Visit

## 2018-05-06 ENCOUNTER — Encounter (HOSPITAL_COMMUNITY): Payer: Self-pay

## 2018-05-06 ENCOUNTER — Ambulatory Visit (HOSPITAL_COMMUNITY): Payer: Self-pay

## 2018-05-09 ENCOUNTER — Ambulatory Visit (HOSPITAL_COMMUNITY)

## 2018-05-27 ENCOUNTER — Ambulatory Visit (HOSPITAL_COMMUNITY)
Admission: RE | Admit: 2018-05-27 | Discharge: 2018-05-27 | Disposition: A | Discharge status: Home / Self Care | Payer: Self-pay | Source: Ambulatory Visit | Attending: Nurse Practitioner | Admitting: Nurse Practitioner

## 2018-05-27 ENCOUNTER — Other Ambulatory Visit: Payer: Self-pay

## 2018-05-27 DIAGNOSIS — N852 Hypertrophy of uterus: Secondary | ICD-10-CM | POA: Insufficient documentation

## 2018-05-27 DIAGNOSIS — R102 Pelvic and perineal pain: Secondary | ICD-10-CM | POA: Insufficient documentation

## 2018-05-27 DIAGNOSIS — N921 Excessive and frequent menstruation with irregular cycle: Secondary | ICD-10-CM | POA: Insufficient documentation

## 2018-05-30 ENCOUNTER — Ambulatory Visit (HOSPITAL_COMMUNITY): Payer: Self-pay

## 2018-05-30 ENCOUNTER — Other Ambulatory Visit: Payer: Self-pay

## 2018-05-30 ENCOUNTER — Other Ambulatory Visit (HOSPITAL_COMMUNITY): Payer: Self-pay | Admitting: Nurse Practitioner

## 2018-05-30 ENCOUNTER — Ambulatory Visit (HOSPITAL_COMMUNITY)
Admission: RE | Admit: 2018-05-30 | Discharge: 2018-05-30 | Disposition: A | Discharge status: Home / Self Care | Payer: Self-pay | Source: Ambulatory Visit | Attending: Nurse Practitioner | Admitting: Nurse Practitioner

## 2018-05-30 DIAGNOSIS — Z1231 Encounter for screening mammogram for malignant neoplasm of breast: Secondary | ICD-10-CM

## 2018-11-02 ENCOUNTER — Emergency Department (HOSPITAL_COMMUNITY)
Admission: EM | Admit: 2018-11-02 | Discharge: 2018-11-03 | Disposition: A | Discharge status: Home / Self Care | Payer: Self-pay | Attending: Emergency Medicine | Admitting: Emergency Medicine

## 2018-11-02 ENCOUNTER — Encounter (HOSPITAL_COMMUNITY): Payer: Self-pay | Admitting: *Deleted

## 2018-11-02 ENCOUNTER — Other Ambulatory Visit: Payer: Self-pay

## 2018-11-02 DIAGNOSIS — E119 Type 2 diabetes mellitus without complications: Secondary | ICD-10-CM | POA: Insufficient documentation

## 2018-11-02 DIAGNOSIS — R443 Hallucinations, unspecified: Secondary | ICD-10-CM | POA: Insufficient documentation

## 2018-11-02 DIAGNOSIS — F17210 Nicotine dependence, cigarettes, uncomplicated: Secondary | ICD-10-CM | POA: Insufficient documentation

## 2018-11-02 DIAGNOSIS — I10 Essential (primary) hypertension: Secondary | ICD-10-CM | POA: Insufficient documentation

## 2018-11-02 DIAGNOSIS — R45851 Suicidal ideations: Secondary | ICD-10-CM | POA: Insufficient documentation

## 2018-11-02 DIAGNOSIS — Z7984 Long term (current) use of oral hypoglycemic drugs: Secondary | ICD-10-CM | POA: Insufficient documentation

## 2018-11-02 DIAGNOSIS — Z79899 Other long term (current) drug therapy: Secondary | ICD-10-CM | POA: Insufficient documentation

## 2018-11-02 DIAGNOSIS — F329 Major depressive disorder, single episode, unspecified: Secondary | ICD-10-CM | POA: Insufficient documentation

## 2018-11-02 LAB — COMPREHENSIVE METABOLIC PANEL
ALT: 18 U/L (ref 0–44)
AST: 23 U/L (ref 15–41)
Albumin: 4.3 g/dL (ref 3.5–5.0)
Alkaline Phosphatase: 91 U/L (ref 38–126)
Anion gap: 11 (ref 5–15)
BUN: 7 mg/dL (ref 6–20)
CO2: 21 mmol/L — ABNORMAL LOW (ref 22–32)
Calcium: 9.1 mg/dL (ref 8.9–10.3)
Chloride: 100 mmol/L (ref 98–111)
Creatinine, Ser: 0.53 mg/dL (ref 0.44–1.00)
GFR calc Af Amer: >60 mL/min (ref >60)
GFR calc non Af Amer: >60 mL/min (ref >60)
Glucose, Bld: 204 mg/dL — ABNORMAL HIGH (ref 70–99)
Potassium: 3.8 mmol/L (ref 3.5–5.1)
Sodium: 132 mmol/L — ABNORMAL LOW (ref 135–145)
Total Bilirubin: 0.9 mg/dL (ref 0.3–1.2)
Total Protein: 8.2 g/dL — ABNORMAL HIGH (ref 6.5–8.1)

## 2018-11-02 LAB — CBC WITH DIFFERENTIAL/PLATELET
Abs Immature Granulocytes: 0.03 10*3/uL (ref 0.00–0.07)
Basophils Absolute: 0 10*3/uL (ref 0.0–0.1)
Basophils Relative: 0 %
Eosinophils Absolute: 0 10*3/uL (ref 0.0–0.5)
Eosinophils Relative: 0 %
HCT: 41.8 % (ref 36.0–46.0)
Hemoglobin: 13.4 g/dL (ref 12.0–15.0)
Immature Granulocytes: 0 %
Lymphocytes Relative: 21 %
Lymphs Abs: 2.1 10*3/uL (ref 0.7–4.0)
MCH: 28.6 pg (ref 26.0–34.0)
MCHC: 32.1 g/dL (ref 30.0–36.0)
MCV: 89.3 fL (ref 80.0–100.0)
Monocytes Absolute: 0.6 10*3/uL (ref 0.1–1.0)
Monocytes Relative: 6 %
Neutro Abs: 7.1 10*3/uL (ref 1.7–7.7)
Neutrophils Relative %: 73 %
Platelets: 318 10*3/uL (ref 150–400)
RBC: 4.68 MIL/uL (ref 3.87–5.11)
RDW: 16.4 % — ABNORMAL HIGH (ref 11.5–15.5)
WBC: 9.8 10*3/uL (ref 4.0–10.5)
nRBC: 0 % (ref 0.0–0.2)

## 2018-11-02 LAB — RAPID URINE DRUG SCREEN, HOSP PERFORMED
Amphetamines: POSITIVE — AB
Barbiturates: NOT DETECTED
Benzodiazepines: NOT DETECTED
Cocaine: NOT DETECTED
Opiates: NOT DETECTED
Tetrahydrocannabinol: POSITIVE — AB

## 2018-11-02 LAB — SALICYLATE LEVEL: Salicylate Lvl: <7 mg/dL (ref 2.8–30.0)

## 2018-11-02 LAB — POC URINE PREG, ED: Preg Test, Ur: NEGATIVE

## 2018-11-02 LAB — ACETAMINOPHEN LEVEL: Acetaminophen (Tylenol), Serum: <10 ug/mL — ABNORMAL LOW (ref 10–30)

## 2018-11-02 LAB — ETHANOL: Alcohol, Ethyl (B): <10 mg/dL (ref <10)

## 2018-11-02 MED ORDER — LORAZEPAM 2 MG/ML IJ SOLN: 0.0000 mg | Freq: Four times a day (QID) | INTRAMUSCULAR | Status: DC

## 2018-11-02 MED ORDER — LORAZEPAM 2 MG/ML IJ SOLN: 0.0000 mg | Freq: Two times a day (BID) | INTRAMUSCULAR | Status: DC

## 2018-11-02 MED ORDER — LORAZEPAM 1 MG PO TABS
0.0000 mg | ORAL_TABLET | Freq: Four times a day (QID) | ORAL | Status: DC
  Administered 2018-11-02 – 2018-11-03 (×2): 2 mg via ORAL
  Filled 2018-11-02 (×2): qty 2

## 2018-11-02 MED ORDER — LORAZEPAM 1 MG PO TABS: 0.0000 mg | ORAL_TABLET | Freq: Two times a day (BID) | ORAL | Status: DC

## 2018-11-02 MED ORDER — VITAMIN B-1 100 MG PO TABS: 100.0000 mg | ORAL_TABLET | Freq: Every day | ORAL | Status: DC

## 2018-11-02 MED ORDER — FOLIC ACID 1 MG PO TABS
1.0000 mg | ORAL_TABLET | Freq: Once | ORAL | Status: AC
  Administered 2018-11-02: 20:00:00 1 mg via ORAL
  Filled 2018-11-02: qty 1

## 2018-11-02 MED ORDER — THIAMINE HCL 100 MG/ML IJ SOLN: 100.0000 mg | Freq: Every day | INTRAMUSCULAR | Status: DC

## 2018-11-02 NOTE — ED Provider Notes (Signed)
[facility] EMERGENCY DEPARTMENT Provider Note   CSN: [id number] Arrival date & time: 11/02/18  1231     History   Chief Complaint Chief Complaint  Patient presents with  . Alcohol Problem  . Suicidal    HPI Julie Burnett is a 48 y.o. female history of polysubstance abuse, bipolar, SI attempt, hypertension, diabetes, alcohol dependence presents today with suicidal ideations and requesting detox.  Patient reports that she is on multiple medications daily including alcohol, Neurontin, marijuana, Adderall.  She request that she is tired of being on so many medications and is wanting to quit using.  She reports that her main concern is her alcohol addiction.  Patient reports plan of suicidal ideations as she is feeling hopeless with her drug/alcohol situation.  She plans on taking all of her medications at once to end her life.  Patient reports occasional auditory noncommand hallucinations.  Patient also reports occasional visual hallucinations but would not describe this to me.  She denies any homicidal ideations.  Patient reports that she is feeling anxious at this time but otherwise feels well.  She reports that she took 1 Adderall prior to arrival.  Denies any other drug or alcohol use today.  She denies any ingestion or injury.  No additional concerns at this time.     HPI  Past Medical History:  Diagnosis Date  . Alcohol dependence (HCC)   . Anxiety   . Back pain   . Bipolar 1 disorder (HCC)   . Depression   . Diabetes mellitus    supposed to take metformin but does not take  . Diabetes mellitus without complication (HCC)   . Drug-seeking behavior   . H/O suicide attempt   . High cholesterol   . Hypertension   . Liver disease   . Polysubstance abuse [facility])     Patient Active Problem List   Diagnosis Date Noted  . MDD (major depressive disorder) 03/05/2015  . Major depressive disorder, recurrent episode, severe, without mention of psychotic behavior 07/25/2012  .  Alcohol withdrawal (HCC) 07/25/2012  . Alcohol dependence (HCC) 07/25/2012  . Opioid dependence (HCC) 11/12/2011  . Polysubstance dependence (HCC) 11/12/2011  . Diabetes mellitus (HCC) 11/12/2011    Past Surgical History:  Procedure Laterality Date  . BACK SURGERY    . CLOSED REDUCTION FINGER WITH PERCUTANEOUS PINNING Right 09/17/2015   Procedure: Right Index and Long Finger CLOSED REDUCTION AND  PERCUTANEOUS PINNING OF FRACTURES;  Surgeon: Betha Loa, MD;  Location: [facility];  Service: Orthopedics;  Laterality: Right;  Right Index and Long Finger CLOSED REDUCTION AND  PERCUTANEOUS PINNING OF FRACTURES  . OPEN REDUCTION INTERNAL FIXATION (ORIF) DISTAL RADIAL FRACTURE Right 05/16/2015   Procedure: RIGHT OPEN REDUCTION INTERNAL FIXATION (ORIF) DISTAL RADIUS AND SCAPHOID ORIF;  Surgeon: Betha Loa, MD;  Location: [facility];  Service: Orthopedics;  Laterality: Right;  . OPEN REDUCTION INTERNAL FIXATION (ORIF) METACARPAL Right 09/17/2015   Procedure: OPEN REDUCTION EXTERNAL FIXATION (ORIF) OF RIGHT RING FINGER;  Surgeon: Betha Loa, MD;  Location: [facility];  Service: Orthopedics;  Laterality: Right;  OPEN REDUCTION EXTERNAL FIXATION (ORIF) OF RIGHT RING FINGER  . ORIF SCAPHOID FRACTURE Right 05/16/2015   Procedure: RIGHT OPEN REDUCTION INTERNAL FIXATION (ORIF) SCAPHOID FRACTURE;  Surgeon: Betha Loa, MD;  Location: [facility];  Service: Orthopedics;  Laterality: Right;     OB History   No obstetric history on file.      Home Medications  Prior to Admission medications   Medication Sig Start Date End Date Taking? Authorizing Provider  amphetamine-dextroamphetamine (ADDERALL) 20 MG tablet Take 20 mg by mouth daily.    [provider]  dicyclomine (BENTYL) 20 MG tablet Take 1 tablet (20 mg total) by mouth 2 (two) times daily. Patient not taking: Reported on 11/02/2018 08/10/17   Gilda CreasePollina, Christopher J, MD  ibuprofen  (ADVIL,MOTRIN) 800 MG tablet Take 1 tablet (800 mg total) by mouth every 8 (eight) hours as needed for moderate pain or cramping. Patient not taking: Reported on 11/02/2018 08/10/17   Gilda CreasePollina, Christopher J, MD  metFORMIN (GLUCOPHAGE) 500 MG tablet Take 500 mg by mouth 2 (two) times daily with a meal.    [provider]  methocarbamol (ROBAXIN) 500 MG tablet Take 1 or 2 po Q 6hrs for muscle pain Patient not taking: Reported on 11/02/2018 01/06/17   Devoria AlbeKnapp, Iva, MD    Family History No family history on file.  Social History Social History   Tobacco Use  . Smoking status: Current Every Day Smoker    Packs/day: 1.00    Years: 17.00    Pack years: 17.00    Types: Cigarettes  . Smokeless tobacco: Never Used  Substance Use Topics  . Alcohol use: Yes    Alcohol/week: 6.0 standard drinks    Types: 6 Cans of beer per week    Frequency: Never    Comment: 2 cases of beer daily   . Drug use: Yes    Types: Marijuana, "Crack" cocaine    Comment: occ     Allergies   Naprosyn [naproxen] and Codeine   Review of Systems Review of Systems Ten systems are reviewed and are negative for acute change except as noted in the HPI   Physical Exam Updated Vital Signs BP (!) 160/107   Pulse 91   Temp 98.5 F (36.9 C) (Oral)   Resp 16   Ht 5' 4.5" (1.638 m)   Wt 45.4 kg   LMP  (Within Weeks)   SpO2 100%   BMI 16.90 kg/m   Physical Exam Constitutional:      General: She is not in acute distress.    Appearance: Normal appearance. She is well-developed. She is not ill-appearing or diaphoretic.  HENT:     Head: Normocephalic and atraumatic.     Right Ear: External ear normal.     Left Ear: External ear normal.     Nose: Nose normal.  Eyes:     General: Vision grossly intact. Gaze aligned appropriately.     Pupils: Pupils are equal, round, and reactive to light.  Neck:     Musculoskeletal: Normal range of motion.     Trachea: Trachea and phonation normal. No tracheal  deviation.  Pulmonary:     Effort: Pulmonary effort is normal. No respiratory distress.  Abdominal:     General: There is no distension.     Palpations: Abdomen is soft.     Tenderness: There is no abdominal tenderness. There is no guarding or rebound.  Musculoskeletal: Normal range of motion.  Skin:    General: Skin is warm and dry.  Neurological:     Mental Status: She is alert.     GCS: GCS eye subscore is 4. GCS verbal subscore is 5. GCS motor subscore is 6.     Comments: Speech is clear and goal oriented, follows commands Major Cranial nerves without deficit, no facial droop Moves extremities without ataxia, coordination intact  Psychiatric:  Attention and Perception: She is attentive. She perceives auditory and visual hallucinations.        Mood and Affect: Mood is anxious. Affect is tearful.        Speech: Speech normal.        Behavior: Behavior normal. Behavior is cooperative.        Thought Content: Thought content includes suicidal ideation. Thought content does not include homicidal ideation. Thought content includes suicidal plan.        Cognition and Memory: Cognition normal.      ED Treatments / Results  Labs (all labs ordered are listed, but only abnormal results are displayed) Labs Reviewed  COMPREHENSIVE METABOLIC PANEL - Abnormal; Notable for the following components:      Result Value   Sodium 132 (*)    CO2 21 (*)    Glucose, Bld 204 (*)    Total Protein 8.2 (*)    All other components within normal limits  RAPID URINE DRUG SCREEN, HOSP PERFORMED - Abnormal; Notable for the following components:   Amphetamines POSITIVE (*)    Tetrahydrocannabinol POSITIVE (*)    All other components within normal limits  CBC WITH DIFFERENTIAL/PLATELET - Abnormal; Notable for the following components:   RDW 16.4 (*)    All other components within normal limits  ACETAMINOPHEN LEVEL - Abnormal; Notable for the following components:   Acetaminophen (Tylenol), Serum  <10 (*)    All other components within normal limits  ETHANOL  SALICYLATE LEVEL  POC URINE PREG, ED    EKG EKG Interpretation  Date/Time:  Wednesday November 02 2018 13:50:16 EDT Ventricular Rate:  72 PR Interval:  150 QRS Duration: 82 QT Interval:  414 QTC Calculation: 453 R Axis:   76 Text Interpretation:  Normal sinus rhythm Normal ECG Confirmed by Isla Pence [phone]) on 11/02/2018 2:34:52 PM   Radiology No results found.  Procedures Procedures (including critical care time)  Medications Ordered in ED Medications - No data to display   Initial Impression / Assessment and Plan / ED Course  I have reviewed the triage vital signs and the nursing notes.  Pertinent labs & imaging results that were available during my care of the patient were reviewed by me and considered in my medical decision making (see chart for details).    Urine pregnancy negative Salicylate negative Ethanol negative Acetaminophen level negative CMP nonacute CBC nonacute UDS positive for amphetamines and THC EKG:  Normal sinus rhythm Normal ECG Confirmed by Isla Pence [phone]) on 11/02/2018 2:34:52 PM - Patient overall well-appearing and in no acute distress.  No recent ingestions or injuries.  No recent infectious-like symptoms.  Endorsing SI with plan along with visual and auditory hallucinations.  Laboratory results and EKG reviewed and without acute findings.  At this time there does not appear to be any evidence of an acute emergency medical condition and the patient appears stable TTS evaluation.  Note: Portions of this report may have been transcribed using voice recognition software. Every effort was made to ensure accuracy; however, inadvertent computerized transcription errors may still be present. Final Clinical Impressions(s) / ED Diagnoses   Final diagnoses:  Suicidal ideation  Hallucinations    ED Discharge Orders    None       Gari Crown 11/02/18  1437    Isla Pence, MD 11/03/18 [phone]
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
## 2018-11-02 NOTE — ED Triage Notes (Addendum)
Pt comes into triage and states, "I'm a drug addict and an alcoholic. I just can't get off this stuff by myself". Pt reports drinking about 2 cases of beer daily, last drank yesterday. When asked what drugs pt wants to get off of she states, "mainly Neurontin". Only illegal drug use that pt reports is marijuana.   During triage pt states, "I feel like if I leave here I'm going to kill myself". Pt reports she has had thoughts of wanting to take a hand full of pills and "just ending it all".

## 2018-11-03 LAB — CBG MONITORING, ED: Glucose-Capillary: 148 mg/dL — ABNORMAL HIGH (ref 70–99)

## 2018-11-03 NOTE — ED Provider Notes (Signed)
Patient assessed in the morning and she does have some frustrations for not having all her home medications and wanting Ativan.  Spoke with behavioral health and she is psychiatrically cleared for outpatient resources and detox.  Patient stable for discharge at this time.  Vital signs reviewed no significant concerns.   Golda Acre, MD 11/03/18 [phone]
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
## 2018-11-03 NOTE — Discharge Instructions (Addendum)
Follow-up resources recommended by behavioral health.

## 2019-07-31 ENCOUNTER — Emergency Department (HOSPITAL_COMMUNITY): Payer: Medicaid Other

## 2019-07-31 ENCOUNTER — Emergency Department (HOSPITAL_COMMUNITY)
Admission: EM | Admit: 2019-07-31 | Discharge: 2019-07-31 | Disposition: A | Discharge status: Home / Self Care | Payer: Medicaid Other | Attending: Emergency Medicine | Admitting: Emergency Medicine

## 2019-07-31 ENCOUNTER — Encounter (HOSPITAL_COMMUNITY): Payer: Self-pay | Admitting: *Deleted

## 2019-07-31 DIAGNOSIS — R410 Disorientation, unspecified: Secondary | ICD-10-CM | POA: Insufficient documentation

## 2019-07-31 DIAGNOSIS — Z5321 Procedure and treatment not carried out due to patient leaving prior to being seen by health care provider: Secondary | ICD-10-CM | POA: Insufficient documentation

## 2019-07-31 LAB — CBC WITH DIFFERENTIAL/PLATELET
Abs Immature Granulocytes: 0.04 10*3/uL (ref 0.00–0.07)
Basophils Absolute: 0 10*3/uL (ref 0.0–0.1)
Basophils Relative: 0 %
Eosinophils Absolute: 0.1 10*3/uL (ref 0.0–0.5)
Eosinophils Relative: 1 %
HCT: 44.6 % (ref 36.0–46.0)
Hemoglobin: 14.3 g/dL (ref 12.0–15.0)
Immature Granulocytes: 0 %
Lymphocytes Relative: 25 %
Lymphs Abs: 3 10*3/uL (ref 0.7–4.0)
MCH: 29.4 pg (ref 26.0–34.0)
MCHC: 32.1 g/dL (ref 30.0–36.0)
MCV: 91.8 fL (ref 80.0–100.0)
Monocytes Absolute: 0.7 10*3/uL (ref 0.1–1.0)
Monocytes Relative: 6 %
Neutro Abs: 7.9 10*3/uL — ABNORMAL HIGH (ref 1.7–7.7)
Neutrophils Relative %: 68 %
Platelets: 410 10*3/uL — ABNORMAL HIGH (ref 150–400)
RBC: 4.86 MIL/uL (ref 3.87–5.11)
RDW: 14.1 % (ref 11.5–15.5)
WBC: 11.7 10*3/uL — ABNORMAL HIGH (ref 4.0–10.5)
nRBC: 0 % (ref 0.0–0.2)

## 2019-07-31 LAB — COMPREHENSIVE METABOLIC PANEL
ALT: 19 U/L (ref 0–44)
AST: 22 U/L (ref 15–41)
Albumin: 4.4 g/dL (ref 3.5–5.0)
Alkaline Phosphatase: 75 U/L (ref 38–126)
Anion gap: 12 (ref 5–15)
BUN: 8 mg/dL (ref 6–20)
CO2: 22 mmol/L (ref 22–32)
Calcium: 9.7 mg/dL (ref 8.9–10.3)
Chloride: 104 mmol/L (ref 98–111)
Creatinine, Ser: 0.61 mg/dL (ref 0.44–1.00)
GFR calc Af Amer: >60 mL/min (ref >60)
GFR calc non Af Amer: >60 mL/min (ref >60)
Glucose, Bld: 127 mg/dL — ABNORMAL HIGH (ref 70–99)
Potassium: 3.7 mmol/L (ref 3.5–5.1)
Sodium: 138 mmol/L (ref 135–145)
Total Bilirubin: 0.6 mg/dL (ref 0.3–1.2)
Total Protein: 8.3 g/dL — ABNORMAL HIGH (ref 6.5–8.1)

## 2019-07-31 LAB — TROPONIN I (HIGH SENSITIVITY): Troponin I (High Sensitivity): <2 ng/L (ref <18)

## 2019-07-31 LAB — ETHANOL: Alcohol, Ethyl (B): <10 mg/dL (ref <10)

## 2019-07-31 LAB — AMMONIA: Ammonia: 16 umol/L (ref 9–35)
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
## 2019-07-31 NOTE — ED Notes (Signed)
Pt denies ETOH or drug use.  Pt keeps flipping through her photos on her cell phone.

## 2019-08-24 ENCOUNTER — Emergency Department (HOSPITAL_COMMUNITY): Payer: Self-pay

## 2019-08-24 ENCOUNTER — Other Ambulatory Visit: Payer: Self-pay

## 2019-08-24 ENCOUNTER — Emergency Department (HOSPITAL_COMMUNITY)
Admission: EM | Admit: 2019-08-24 | Discharge: 2019-08-24 | Discharge status: Against Medical Advice | Payer: Medicaid Other

## 2019-08-24 ENCOUNTER — Emergency Department (HOSPITAL_COMMUNITY)
Admission: EM | Admit: 2019-08-24 | Discharge: 2019-08-24 | Disposition: A | Discharge status: Home / Self Care | Payer: Self-pay | Attending: Emergency Medicine | Admitting: Emergency Medicine

## 2019-08-24 ENCOUNTER — Encounter (HOSPITAL_COMMUNITY): Payer: Self-pay | Admitting: Emergency Medicine

## 2019-08-24 DIAGNOSIS — R079 Chest pain, unspecified: Secondary | ICD-10-CM | POA: Insufficient documentation

## 2019-08-24 DIAGNOSIS — Z5321 Procedure and treatment not carried out due to patient leaving prior to being seen by health care provider: Secondary | ICD-10-CM | POA: Insufficient documentation

## 2019-08-24 LAB — CBC
HCT: 43.7 % (ref 36.0–46.0)
Hemoglobin: 14.1 g/dL (ref 12.0–15.0)
MCH: 29.1 pg (ref 26.0–34.0)
MCHC: 32.3 g/dL (ref 30.0–36.0)
MCV: 90.1 fL (ref 80.0–100.0)
Platelets: 367 10*3/uL (ref 150–400)
RBC: 4.85 MIL/uL (ref 3.87–5.11)
RDW: 14.4 % (ref 11.5–15.5)
WBC: 10.8 10*3/uL — ABNORMAL HIGH (ref 4.0–10.5)
nRBC: 0 % (ref 0.0–0.2)

## 2019-08-24 LAB — BASIC METABOLIC PANEL
Anion gap: 14 (ref 5–15)
BUN: 10 mg/dL (ref 6–20)
CO2: 22 mmol/L (ref 22–32)
Calcium: 9.3 mg/dL (ref 8.9–10.3)
Chloride: 99 mmol/L (ref 98–111)
Creatinine, Ser: 0.69 mg/dL (ref 0.44–1.00)
GFR calc Af Amer: >60 mL/min (ref >60)
GFR calc non Af Amer: >60 mL/min (ref >60)
Glucose, Bld: 111 mg/dL — ABNORMAL HIGH (ref 70–99)
Potassium: 3.3 mmol/L — ABNORMAL LOW (ref 3.5–5.1)
Sodium: 135 mmol/L (ref 135–145)

## 2019-08-24 LAB — TROPONIN I (HIGH SENSITIVITY): Troponin I (High Sensitivity): 2 ng/L (ref <18)

## 2019-08-24 NOTE — ED Notes (Signed)
Pt called no answer 
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
## 2019-08-24 NOTE — ED Triage Notes (Signed)
Pt C/o chest pain that started this morning. Pt rambling in triage and unable to remain calm.

## 2020-08-06 ENCOUNTER — Encounter (HOSPITAL_COMMUNITY): Payer: Self-pay | Admitting: Emergency Medicine

## 2020-08-06 ENCOUNTER — Emergency Department (HOSPITAL_COMMUNITY)
Admission: EM | Admit: 2020-08-06 | Discharge: 2020-08-06 | Disposition: A | Discharge status: Home / Self Care | Payer: Medicaid Other | Attending: Emergency Medicine | Admitting: Emergency Medicine

## 2020-08-06 ENCOUNTER — Other Ambulatory Visit: Payer: Self-pay

## 2020-08-06 DIAGNOSIS — M549 Dorsalgia, unspecified: Secondary | ICD-10-CM | POA: Insufficient documentation

## 2020-08-06 DIAGNOSIS — R220 Localized swelling, mass and lump, head: Secondary | ICD-10-CM | POA: Insufficient documentation

## 2020-08-06 DIAGNOSIS — Z5321 Procedure and treatment not carried out due to patient leaving prior to being seen by health care provider: Secondary | ICD-10-CM | POA: Insufficient documentation
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
## 2020-08-06 NOTE — ED Notes (Signed)
Pt not in waiting room at this time.

## 2020-10-02 DIAGNOSIS — S42301A Unspecified fracture of shaft of humerus, right arm, initial encounter for closed fracture: Secondary | ICD-10-CM | POA: Insufficient documentation

## 2020-10-12 ENCOUNTER — Emergency Department (HOSPITAL_COMMUNITY)
Admission: EM | Admit: 2020-10-12 | Discharge: 2020-10-12 | Disposition: A | Discharge status: Home / Self Care | Payer: Self-pay | Attending: Emergency Medicine | Admitting: Emergency Medicine

## 2020-10-12 ENCOUNTER — Encounter (HOSPITAL_COMMUNITY): Payer: Self-pay | Admitting: *Deleted

## 2020-10-12 ENCOUNTER — Other Ambulatory Visit: Payer: Self-pay

## 2020-10-12 DIAGNOSIS — M79601 Pain in right arm: Secondary | ICD-10-CM | POA: Insufficient documentation

## 2020-10-12 DIAGNOSIS — E119 Type 2 diabetes mellitus without complications: Secondary | ICD-10-CM | POA: Insufficient documentation

## 2020-10-12 DIAGNOSIS — Z7984 Long term (current) use of oral hypoglycemic drugs: Secondary | ICD-10-CM | POA: Insufficient documentation

## 2020-10-12 DIAGNOSIS — I10 Essential (primary) hypertension: Secondary | ICD-10-CM | POA: Insufficient documentation

## 2020-10-12 DIAGNOSIS — F17210 Nicotine dependence, cigarettes, uncomplicated: Secondary | ICD-10-CM | POA: Insufficient documentation

## 2020-10-12 MED ORDER — OXYCODONE-ACETAMINOPHEN 5-325 MG PO TABS: 1.0000 | ORAL_TABLET | Freq: Four times a day (QID) | ORAL | 0 refills | Status: DC | PRN

## 2020-10-12 MED ORDER — OXYCODONE-ACETAMINOPHEN 5-325 MG PO TABS
2.0000 | ORAL_TABLET | Freq: Once | ORAL | Status: AC
  Administered 2020-10-12: 2 via ORAL
  Filled 2020-10-12: qty 2

## 2020-10-12 MED ORDER — OXYCODONE-ACETAMINOPHEN 5-325 MG PO TABS
1.0000 | ORAL_TABLET | Freq: Four times a day (QID) | ORAL | 0 refills | Status: DC | PRN
Start: 2020-10-12 — End: 2020-10-23

## 2020-10-12 MED ORDER — OXYCODONE-ACETAMINOPHEN 5-325 MG PO TABS: 1.0000 | ORAL_TABLET | Freq: Three times a day (TID) | ORAL | 0 refills | Status: DC | PRN

## 2020-10-12 MED ORDER — KETOROLAC TROMETHAMINE 60 MG/2ML IM SOLN
60.0000 mg | Freq: Once | INTRAMUSCULAR | Status: DC
Start: 2020-10-12 — End: 2020-10-12
  Filled 2020-10-12: qty 2

## 2020-10-12 NOTE — ED Triage Notes (Signed)
Pt arrives via [facility], with c/o right arm and shoulder pain for about 1.5 weeks, seen at [facility] for the same. Ran out of her pain meds hr 100, rr 18, 94% ra.
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
## 2020-10-14 MED FILL — Oxycodone w/ Acetaminophen Tab 5-325 MG: ORAL | Qty: 6 | Status: AC

## 2020-10-17 ENCOUNTER — Other Ambulatory Visit: Payer: Self-pay

## 2020-10-18 ENCOUNTER — Encounter (HOSPITAL_COMMUNITY): Payer: Self-pay | Admitting: Emergency Medicine

## 2020-10-18 ENCOUNTER — Emergency Department (HOSPITAL_COMMUNITY)
Admission: EM | Admit: 2020-10-18 | Discharge: 2020-10-18 | Disposition: A | Discharge status: Home / Self Care | Payer: Medicaid Other | Attending: Emergency Medicine | Admitting: Emergency Medicine

## 2020-10-18 DIAGNOSIS — I10 Essential (primary) hypertension: Secondary | ICD-10-CM | POA: Insufficient documentation

## 2020-10-18 DIAGNOSIS — F17210 Nicotine dependence, cigarettes, uncomplicated: Secondary | ICD-10-CM | POA: Insufficient documentation

## 2020-10-18 DIAGNOSIS — G8929 Other chronic pain: Secondary | ICD-10-CM | POA: Insufficient documentation

## 2020-10-18 DIAGNOSIS — E119 Type 2 diabetes mellitus without complications: Secondary | ICD-10-CM | POA: Insufficient documentation

## 2020-10-18 DIAGNOSIS — M25511 Pain in right shoulder: Secondary | ICD-10-CM | POA: Insufficient documentation

## 2020-10-18 MED ORDER — OXYCODONE HCL 5 MG PO TABS: 5.0000 mg | ORAL_TABLET | ORAL | 0 refills | Status: DC | PRN

## 2020-10-18 MED ORDER — OXYCODONE HCL 5 MG PO TABS
5.0000 mg | ORAL_TABLET | Freq: Once | ORAL | Status: AC
  Administered 2020-10-18: 5 mg via ORAL
  Filled 2020-10-18: qty 1
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
## 2020-10-18 NOTE — Discharge Instructions (Addendum)
You were evaluated in the Emergency Department and after careful evaluation, we did not find any emergent condition requiring admission or further testing in the hospital.  Your exam/testing today was overall reassuring.  It is very important that you follow-up with the orthopedic specialist for any continued prescriptions and for continued care of your shoulder.  Recommend Tylenol 1000 mg every 4-6 hours and/or Motrin 600 mg every 4-6 hours for pain.  You can use the oxycodone medication for more significant pain.  Please return to the Emergency Department if you experience any worsening of your condition.  Thank you for allowing [place] to be a part of your care.
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
## 2020-10-23 ENCOUNTER — Ambulatory Visit: Payer: Medicaid Other

## 2020-10-23 ENCOUNTER — Ambulatory Visit (INDEPENDENT_AMBULATORY_CARE_PROVIDER_SITE_OTHER): Payer: Self-pay | Admitting: Orthopedic Surgery

## 2020-10-23 ENCOUNTER — Encounter: Payer: Self-pay | Admitting: Orthopedic Surgery

## 2020-10-23 ENCOUNTER — Other Ambulatory Visit: Payer: Self-pay

## 2020-10-23 VITALS — BP 131/97 | HR 86 | Ht 65.0 in | Wt 141.0 lb

## 2020-10-23 DIAGNOSIS — S42201A Unspecified fracture of upper end of right humerus, initial encounter for closed fracture: Secondary | ICD-10-CM

## 2020-10-23 DIAGNOSIS — M25511 Pain in right shoulder: Secondary | ICD-10-CM

## 2020-10-23 DIAGNOSIS — S42209A Unspecified fracture of upper end of unspecified humerus, initial encounter for closed fracture: Secondary | ICD-10-CM

## 2020-10-23 MED ORDER — OXYCODONE-ACETAMINOPHEN 5-325 MG PO TABS: 1.0000 | ORAL_TABLET | Freq: Four times a day (QID) | ORAL | 0 refills | Status: DC | PRN
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
## 2020-10-24 ENCOUNTER — Encounter (HOSPITAL_COMMUNITY): Payer: Self-pay | Admitting: Orthopaedic Surgery

## 2020-10-24 ENCOUNTER — Other Ambulatory Visit: Payer: Self-pay

## 2020-10-24 NOTE — Progress Notes (Signed)
PCP - denies Cardiologist - denies EKG - DOS Chest x-ray - 08/24/19 ECHO -  Cardiac Cath -  CPAP -   Fasting Blood Sugar:  does not check CBG - but pt does have CBG monitor at home that she does not use Checks Blood Sugar:  /day  Blood Thinner Instructions:  Aspirin Instructions:   ERAS Protcol -   COVID TEST- DOS  Anesthesia review: n/a  -------------  SDW INSTRUCTIONS:  Your procedure is scheduled on Friday 10/14. Please report to [facility] Main Entrance "A" at 0930 A.M., and check in at the Admitting office. Call this number if you have problems the morning of surgery: [phone]   Remember: Do not eat or drink after midnight the night before your surgery   Medications to take morning of surgery with a sip of water include: gabapentin (NEURONTIN) oxyCODONE-acetaminophen (PERCOCET/ROXICET)  As of today, STOP taking any Aspirin (unless otherwise instructed by your surgeon), Aleve, Naproxen, Ibuprofen, Motrin, Advil, Goody's, BC's, all herbal medications, fish oil, and all vitamins.  NO oral diabetic medications DOS  ** PLEASE check your blood sugar the morning of your surgery when you wake up and every 2 hours until you get to the Short Stay unit.  If your blood sugar is less than 70 mg/dL, you will need to treat for low blood sugar: Do not take insulin. Treat a low blood sugar (less than 70 mg/dL) with  cup of clear juice (cranberry or apple), 4 glucose tablets, OR glucose gel. Recheck blood sugar in 15 minutes after treatment (to make sure it is greater than 70 mg/dL). If your blood sugar is not greater than 70 mg/dL on recheck, call [phone] for further instructions.    The Morning of Surgery Do not wear jewelry, make-up or nail polish. Do not wear lotions, powders, or perfumes, or deodorant Do not shave 48 hours prior to surgery.    Do not bring valuables to the hospital. [facility] is not responsible for any belongings or valuables.  If you are a  smoker, DO NOT Smoke 24 hours prior to surgery  If you wear a CPAP at night please bring your mask the morning of surgery   Remember that you must have someone to transport you home after your surgery, and remain with you for 24 hours if you are discharged the same day.  Please bring cases for contacts, glasses, hearing aids, dentures or bridgework because it cannot be worn into surgery.   Patients discharged the day of surgery will not be allowed to drive home.   Please shower the NIGHT BEFORE/MORNING OF SURGERY (use antibacterial soap like DIAL soap if possible). Wear comfortable clothes the morning of surgery. Oral Hygiene is also important to reduce your risk of infection.  Remember - BRUSH YOUR TEETH THE MORNING OF SURGERY WITH YOUR REGULAR TOOTHPASTE  Patient denies shortness of breath, fever, cough and chest pain.

## 2020-10-25 ENCOUNTER — Other Ambulatory Visit: Payer: Self-pay

## 2020-10-25 ENCOUNTER — Encounter (HOSPITAL_COMMUNITY): Payer: Self-pay | Admitting: Orthopaedic Surgery

## 2020-10-25 ENCOUNTER — Ambulatory Visit (HOSPITAL_COMMUNITY): Payer: Self-pay

## 2020-10-25 ENCOUNTER — Ambulatory Visit (HOSPITAL_COMMUNITY): Payer: Self-pay | Admitting: Anesthesiology

## 2020-10-25 ENCOUNTER — Encounter (HOSPITAL_COMMUNITY)
Admission: RE | Disposition: A | Discharge status: Home / Self Care | Payer: Self-pay | Source: Home / Self Care | Attending: Orthopaedic Surgery

## 2020-10-25 ENCOUNTER — Ambulatory Visit (HOSPITAL_COMMUNITY)
Admission: RE | Admit: 2020-10-25 | Discharge: 2020-10-25 | Disposition: A | Discharge status: Home / Self Care | Payer: Self-pay | Attending: Orthopaedic Surgery | Admitting: Orthopaedic Surgery

## 2020-10-25 DIAGNOSIS — Z886 Allergy status to analgesic agent status: Secondary | ICD-10-CM | POA: Insufficient documentation

## 2020-10-25 DIAGNOSIS — Z96611 Presence of right artificial shoulder joint: Secondary | ICD-10-CM

## 2020-10-25 DIAGNOSIS — S42201A Unspecified fracture of upper end of right humerus, initial encounter for closed fracture: Secondary | ICD-10-CM | POA: Insufficient documentation

## 2020-10-25 DIAGNOSIS — X58XXXA Exposure to other specified factors, initial encounter: Secondary | ICD-10-CM | POA: Insufficient documentation

## 2020-10-25 DIAGNOSIS — Z419 Encounter for procedure for purposes other than remedying health state, unspecified: Secondary | ICD-10-CM

## 2020-10-25 DIAGNOSIS — Z9151 Personal history of suicidal behavior: Secondary | ICD-10-CM | POA: Insufficient documentation

## 2020-10-25 DIAGNOSIS — Z20822 Contact with and (suspected) exposure to covid-19: Secondary | ICD-10-CM | POA: Insufficient documentation

## 2020-10-25 DIAGNOSIS — E7800 Pure hypercholesterolemia, unspecified: Secondary | ICD-10-CM | POA: Insufficient documentation

## 2020-10-25 DIAGNOSIS — F17210 Nicotine dependence, cigarettes, uncomplicated: Secondary | ICD-10-CM | POA: Insufficient documentation

## 2020-10-25 DIAGNOSIS — G8929 Other chronic pain: Secondary | ICD-10-CM | POA: Insufficient documentation

## 2020-10-25 DIAGNOSIS — Z7984 Long term (current) use of oral hypoglycemic drugs: Secondary | ICD-10-CM | POA: Insufficient documentation

## 2020-10-25 DIAGNOSIS — S42291A Other displaced fracture of upper end of right humerus, initial encounter for closed fracture: Secondary | ICD-10-CM

## 2020-10-25 DIAGNOSIS — Z885 Allergy status to narcotic agent status: Secondary | ICD-10-CM | POA: Insufficient documentation

## 2020-10-25 DIAGNOSIS — E119 Type 2 diabetes mellitus without complications: Secondary | ICD-10-CM | POA: Insufficient documentation

## 2020-10-25 DIAGNOSIS — M549 Dorsalgia, unspecified: Secondary | ICD-10-CM | POA: Insufficient documentation

## 2020-10-25 DIAGNOSIS — I10 Essential (primary) hypertension: Secondary | ICD-10-CM | POA: Insufficient documentation

## 2020-10-25 HISTORY — PX: SHOULDER HEMI-ARTHROPLASTY: SHX5049

## 2020-10-25 LAB — SURGICAL PCR SCREEN
MRSA, PCR: NEGATIVE
Staphylococcus aureus: NEGATIVE

## 2020-10-25 LAB — CBC
HCT: 41 % (ref 36.0–46.0)
Hemoglobin: 12.5 g/dL (ref 12.0–15.0)
MCH: 28.8 pg (ref 26.0–34.0)
MCHC: 30.5 g/dL (ref 30.0–36.0)
MCV: 94.5 fL (ref 80.0–100.0)
Platelets: 510 10*3/uL — ABNORMAL HIGH (ref 150–400)
RBC: 4.34 MIL/uL (ref 3.87–5.11)
RDW: 14.6 % (ref 11.5–15.5)
WBC: 9 10*3/uL (ref 4.0–10.5)
nRBC: 0 % (ref 0.0–0.2)

## 2020-10-25 LAB — COMPREHENSIVE METABOLIC PANEL
ALT: 15 U/L (ref 0–44)
AST: 18 U/L (ref 15–41)
Albumin: 3.7 g/dL (ref 3.5–5.0)
Alkaline Phosphatase: 104 U/L (ref 38–126)
Anion gap: 10 (ref 5–15)
BUN: 9 mg/dL (ref 6–20)
CO2: 22 mmol/L (ref 22–32)
Calcium: 9 mg/dL (ref 8.9–10.3)
Chloride: 105 mmol/L (ref 98–111)
Creatinine, Ser: 0.53 mg/dL (ref 0.44–1.00)
GFR, Estimated: >60 mL/min (ref >60)
Glucose, Bld: 104 mg/dL — ABNORMAL HIGH (ref 70–99)
Potassium: 4 mmol/L (ref 3.5–5.1)
Sodium: 137 mmol/L (ref 135–145)
Total Bilirubin: 0.4 mg/dL (ref 0.3–1.2)
Total Protein: 7.4 g/dL (ref 6.5–8.1)

## 2020-10-25 LAB — GLUCOSE, CAPILLARY
Glucose-Capillary: 106 mg/dL — ABNORMAL HIGH (ref 70–99)
Glucose-Capillary: 123 mg/dL — ABNORMAL HIGH (ref 70–99)

## 2020-10-25 LAB — POCT PREGNANCY, URINE: Preg Test, Ur: NEGATIVE

## 2020-10-25 LAB — SARS CORONAVIRUS 2 BY RT PCR (HOSPITAL ORDER, PERFORMED IN ~~LOC~~ HOSPITAL LAB): SARS Coronavirus 2: NEGATIVE

## 2020-10-25 SURGERY — HEMIARTHROPLASTY, SHOULDER
Anesthesia: Regional | Site: Shoulder | Laterality: Right

## 2020-10-25 MED ORDER — TOBRAMYCIN SULFATE 1.2 G IJ SOLR
INTRAMUSCULAR | Status: DC | PRN
  Administered 2020-10-25: 1.2 g

## 2020-10-25 MED ORDER — ROCURONIUM BROMIDE 10 MG/ML (PF) SYRINGE
PREFILLED_SYRINGE | INTRAVENOUS | Status: AC
  Filled 2020-10-25: qty 10

## 2020-10-25 MED ORDER — BUPIVACAINE LIPOSOME 1.3 % IJ SUSP
INTRAMUSCULAR | Status: DC | PRN
  Administered 2020-10-25: 10 mL via PERINEURAL

## 2020-10-25 MED ORDER — ROCURONIUM BROMIDE 10 MG/ML (PF) SYRINGE
PREFILLED_SYRINGE | INTRAVENOUS | Status: DC | PRN
Start: 2020-10-25 — End: 2020-10-25
  Administered 2020-10-25: 70 mg via INTRAVENOUS

## 2020-10-25 MED ORDER — SUGAMMADEX SODIUM 200 MG/2ML IV SOLN
INTRAVENOUS | Status: DC | PRN
Start: 2020-10-25 — End: 2020-10-25
  Administered 2020-10-25: 140 mg via INTRAVENOUS

## 2020-10-25 MED ORDER — ASPIRIN EC 81 MG PO TBEC: 81.0000 mg | DELAYED_RELEASE_TABLET | Freq: Two times a day (BID) | ORAL | 0 refills | Status: DC

## 2020-10-25 MED ORDER — 0.9 % SODIUM CHLORIDE (POUR BTL) OPTIME
TOPICAL | Status: DC | PRN
  Administered 2020-10-25: 1000 mL

## 2020-10-25 MED ORDER — HYDROMORPHONE HCL 1 MG/ML IJ SOLN: 0.2500 mg | INTRAMUSCULAR | Status: DC | PRN

## 2020-10-25 MED ORDER — EPHEDRINE SULFATE-NACL 50-0.9 MG/10ML-% IV SOSY
PREFILLED_SYRINGE | INTRAVENOUS | Status: DC | PRN
  Administered 2020-10-25: 15 mg via INTRAVENOUS

## 2020-10-25 MED ORDER — CHLORHEXIDINE GLUCONATE 0.12 % MT SOLN
15.0000 mL | Freq: Once | OROMUCOSAL | Status: AC
  Administered 2020-10-25: 15 mL via OROMUCOSAL
  Filled 2020-10-25: qty 15

## 2020-10-25 MED ORDER — PROPOFOL 10 MG/ML IV BOLUS
INTRAVENOUS | Status: DC | PRN
  Administered 2020-10-25: 150 mg via INTRAVENOUS

## 2020-10-25 MED ORDER — FENTANYL CITRATE (PF) 250 MCG/5ML IJ SOLN
INTRAMUSCULAR | Status: AC
  Filled 2020-10-25: qty 5

## 2020-10-25 MED ORDER — PHENYLEPHRINE HCL-NACL 20-0.9 MG/250ML-% IV SOLN
INTRAVENOUS | Status: DC | PRN
  Administered 2020-10-25: 50 ug/min via INTRAVENOUS

## 2020-10-25 MED ORDER — OXYCODONE HCL 5 MG/5ML PO SOLN: 5.0000 mg | Freq: Once | ORAL | Status: DC | PRN

## 2020-10-25 MED ORDER — OMEPRAZOLE MAGNESIUM 20 MG PO TBEC: 20.0000 mg | DELAYED_RELEASE_TABLET | Freq: Every day | ORAL | 1 refills | Status: DC

## 2020-10-25 MED ORDER — PROPOFOL 10 MG/ML IV BOLUS
INTRAVENOUS | Status: AC
  Filled 2020-10-25: qty 20

## 2020-10-25 MED ORDER — CELECOXIB 100 MG PO CAPS: 100.0000 mg | ORAL_CAPSULE | Freq: Two times a day (BID) | ORAL | 0 refills | Status: AC

## 2020-10-25 MED ORDER — MIDAZOLAM HCL 2 MG/2ML IJ SOLN
INTRAMUSCULAR | Status: AC
  Administered 2020-10-25: 2 mg via INTRAVENOUS
  Filled 2020-10-25: qty 2

## 2020-10-25 MED ORDER — ACETAMINOPHEN 325 MG PO TABS: 650.0000 mg | ORAL_TABLET | Freq: Four times a day (QID) | ORAL | 1 refills | Status: DC | PRN

## 2020-10-25 MED ORDER — ONDANSETRON HCL 4 MG PO TABS: 4.0000 mg | ORAL_TABLET | Freq: Three times a day (TID) | ORAL | 0 refills | Status: DC | PRN

## 2020-10-25 MED ORDER — BUPIVACAINE-EPINEPHRINE (PF) 0.25% -1:200000 IJ SOLN
INTRAMUSCULAR | Status: AC
  Filled 2020-10-25: qty 30

## 2020-10-25 MED ORDER — ONDANSETRON HCL 4 MG/2ML IJ SOLN: 4.0000 mg | Freq: Once | INTRAMUSCULAR | Status: DC | PRN

## 2020-10-25 MED ORDER — LIDOCAINE 2% (20 MG/ML) 5 ML SYRINGE
INTRAMUSCULAR | Status: DC | PRN
  Administered 2020-10-25: 20 mg via INTRAVENOUS

## 2020-10-25 MED ORDER — OXYCODONE HCL 5 MG PO TABS: 5.0000 mg | ORAL_TABLET | Freq: Once | ORAL | Status: DC | PRN

## 2020-10-25 MED ORDER — EPHEDRINE 5 MG/ML INJ
INTRAVENOUS | Status: AC
  Filled 2020-10-25: qty 5

## 2020-10-25 MED ORDER — LACTATED RINGERS IV SOLN: INTRAVENOUS | Status: DC

## 2020-10-25 MED ORDER — ORAL CARE MOUTH RINSE: 15.0000 mL | Freq: Once | OROMUCOSAL | Status: AC

## 2020-10-25 MED ORDER — FENTANYL CITRATE (PF) 100 MCG/2ML IJ SOLN: 100.0000 ug | Freq: Once | INTRAMUSCULAR | Status: AC

## 2020-10-25 MED ORDER — AMISULPRIDE (ANTIEMETIC) 5 MG/2ML IV SOLN
10.0000 mg | Freq: Once | INTRAVENOUS | Status: AC | PRN
Start: 2020-10-25 — End: 2020-10-25
  Administered 2020-10-25: 10 mg via INTRAVENOUS

## 2020-10-25 MED ORDER — FENTANYL CITRATE (PF) 100 MCG/2ML IJ SOLN
INTRAMUSCULAR | Status: AC
  Administered 2020-10-25: 100 ug via INTRAVENOUS
  Filled 2020-10-25: qty 2

## 2020-10-25 MED ORDER — LIDOCAINE 2% (20 MG/ML) 5 ML SYRINGE
INTRAMUSCULAR | Status: AC
  Filled 2020-10-25: qty 5

## 2020-10-25 MED ORDER — DEXAMETHASONE SODIUM PHOSPHATE 10 MG/ML IJ SOLN
INTRAMUSCULAR | Status: AC
  Filled 2020-10-25: qty 1

## 2020-10-25 MED ORDER — MIDAZOLAM HCL 2 MG/2ML IJ SOLN: 2.0000 mg | Freq: Once | INTRAMUSCULAR | Status: AC

## 2020-10-25 MED ORDER — SODIUM CHLORIDE 0.9 % IR SOLN
Status: DC | PRN
  Administered 2020-10-25: 3000 mL

## 2020-10-25 MED ORDER — AMISULPRIDE (ANTIEMETIC) 5 MG/2ML IV SOLN
INTRAVENOUS | Status: AC
  Filled 2020-10-25: qty 4

## 2020-10-25 MED ORDER — TOBRAMYCIN SULFATE 1.2 G IJ SOLR
INTRAMUSCULAR | Status: AC
  Filled 2020-10-25: qty 1.2

## 2020-10-25 MED ORDER — VANCOMYCIN HCL 1000 MG IV SOLR
INTRAVENOUS | Status: AC
  Filled 2020-10-25: qty 20

## 2020-10-25 MED ORDER — VANCOMYCIN HCL 1000 MG IV SOLR
INTRAVENOUS | Status: DC | PRN
  Administered 2020-10-25: 1000 mg

## 2020-10-25 MED ORDER — ONDANSETRON HCL 4 MG/2ML IJ SOLN
INTRAMUSCULAR | Status: DC | PRN
  Administered 2020-10-25: 4 mg via INTRAVENOUS

## 2020-10-25 MED ORDER — ONDANSETRON HCL 4 MG/2ML IJ SOLN
INTRAMUSCULAR | Status: AC
  Filled 2020-10-25: qty 2

## 2020-10-25 MED ORDER — TRANEXAMIC ACID-NACL 1000-0.7 MG/100ML-% IV SOLN
1000.0000 mg | INTRAVENOUS | Status: AC
  Administered 2020-10-25: 1000 mg via INTRAVENOUS
  Filled 2020-10-25: qty 100

## 2020-10-25 MED ORDER — PHENYLEPHRINE 40 MCG/ML (10ML) SYRINGE FOR IV PUSH (FOR BLOOD PRESSURE SUPPORT)
PREFILLED_SYRINGE | INTRAVENOUS | Status: AC
  Filled 2020-10-25: qty 10

## 2020-10-25 MED ORDER — DEXAMETHASONE SODIUM PHOSPHATE 10 MG/ML IJ SOLN
INTRAMUSCULAR | Status: DC | PRN
Start: 2020-10-25 — End: 2020-10-25
  Administered 2020-10-25: 5 mg via INTRAVENOUS

## 2020-10-25 MED ORDER — ACETAMINOPHEN 500 MG PO TABS
1000.0000 mg | ORAL_TABLET | Freq: Once | ORAL | Status: AC
  Administered 2020-10-25: 1000 mg via ORAL
  Filled 2020-10-25: qty 2

## 2020-10-25 MED ORDER — CEFAZOLIN SODIUM-DEXTROSE 2-4 GM/100ML-% IV SOLN
2.0000 g | INTRAVENOUS | Status: AC
  Administered 2020-10-25: 2 g via INTRAVENOUS
  Filled 2020-10-25: qty 100

## 2020-10-25 MED ORDER — OXYCODONE HCL 5 MG PO TABS: 5.0000 mg | ORAL_TABLET | ORAL | 0 refills | Status: DC | PRN

## 2020-10-25 MED ORDER — PHENYLEPHRINE 40 MCG/ML (10ML) SYRINGE FOR IV PUSH (FOR BLOOD PRESSURE SUPPORT)
PREFILLED_SYRINGE | INTRAVENOUS | Status: DC | PRN
  Administered 2020-10-25 (×2): 80 ug via INTRAVENOUS

## 2020-10-25 MED ORDER — BUPIVACAINE HCL (PF) 0.5 % IJ SOLN
INTRAMUSCULAR | Status: DC | PRN
  Administered 2020-10-25: 15 mL via PERINEURAL

## 2020-10-25 SURGICAL SUPPLY — 68 items
AID PSTN UNV HD RSTRNT DISP (MISCELLANEOUS) ×2
APL PRP STRL LF DISP 70% ISPRP (MISCELLANEOUS) ×4
BAG COUNTER SPONGE SURGICOUNT (BAG) ×3 IMPLANT
BAG SPNG CNTER NS LX DISP (BAG) ×2
BLADE SAW SAG 73X25 THK (BLADE) ×1
BLADE SAW SGTL 73X25 THK (BLADE) ×2 IMPLANT
CHLORAPREP W/TINT 26 (MISCELLANEOUS) ×6 IMPLANT
CLSR STERI-STRIP ANTIMIC 1/2X4 (GAUZE/BANDAGES/DRESSINGS) ×2 IMPLANT
COVER SURGICAL LIGHT HANDLE (MISCELLANEOUS) ×3 IMPLANT
DRAPE C-ARM 42X72 X-RAY (DRAPES) IMPLANT
DRAPE HALF SHEET 40X57 (DRAPES) ×3 IMPLANT
DRAPE INCISE IOBAN 66X45 STRL (DRAPES) ×6 IMPLANT
DRAPE ORTHO SPLIT 77X108 STRL (DRAPES) ×6
DRAPE SURG ORHT 6 SPLT 77X108 (DRAPES) ×4 IMPLANT
DRAPE SWITCH (DRAPES) ×3 IMPLANT
DRAPE U-SHAPE 47X51 STRL (DRAPES) IMPLANT
DRSG AQUACEL AG ADV 3.5X 6 (GAUZE/BANDAGES/DRESSINGS) ×3 IMPLANT
ELECT REM PT RETURN 9FT ADLT (ELECTROSURGICAL) ×3
ELECTRODE REM PT RTRN 9FT ADLT (ELECTROSURGICAL) ×2 IMPLANT
GAUZE XEROFORM 1X8 LF (GAUZE/BANDAGES/DRESSINGS) ×2 IMPLANT
GLOVE SRG 8 PF TXTR STRL LF DI (GLOVE) ×2 IMPLANT
GLOVE SURG ENC MOIS LTX SZ6.5 (GLOVE) ×3 IMPLANT
GLOVE SURG LTX SZ8 (GLOVE) ×6 IMPLANT
GLOVE SURG UNDER LTX SZ6.5 (GLOVE) ×3 IMPLANT
GLOVE SURG UNDER POLY LF SZ8 (GLOVE) ×3
GOWN STRL REUS W/ TWL LRG LVL3 (GOWN DISPOSABLE) ×2 IMPLANT
GOWN STRL REUS W/ TWL XL LVL3 (GOWN DISPOSABLE) IMPLANT
GOWN STRL REUS W/TWL LRG LVL3 (GOWN DISPOSABLE) ×3
GOWN STRL REUS W/TWL XL LVL3 (GOWN DISPOSABLE)
GUIDEWIRE GLENOID 2.5X220 (WIRE) ×2 IMPLANT
HANDPIECE INTERPULSE COAX TIP (DISPOSABLE) ×3
HEAD HUMERAL 43X16 COBALT (Miscellaneous) ×2 IMPLANT
KIT BASIN OR (CUSTOM PROCEDURE TRAY) ×3 IMPLANT
KIT STABILIZATION SHOULDER (MISCELLANEOUS) ×3 IMPLANT
KIT TURNOVER KIT B (KITS) ×3 IMPLANT
MANIFOLD NEPTUNE II (INSTRUMENTS) ×3 IMPLANT
NDL HYPO 25GX1X1/2 BEV (NEEDLE) IMPLANT
NDL MAYO TROCAR (NEEDLE) IMPLANT
NEEDLE HYPO 25GX1X1/2 BEV (NEEDLE) IMPLANT
NEEDLE MAYO TROCAR (NEEDLE) IMPLANT
NS IRRIG 1000ML POUR BTL (IV SOLUTION) ×3 IMPLANT
PACK SHOULDER (CUSTOM PROCEDURE TRAY) ×3 IMPLANT
PAD ARMBOARD 7.5X6 YLW CONV (MISCELLANEOUS) ×6 IMPLANT
RESTRAINT HEAD UNIVERSAL NS (MISCELLANEOUS) ×3 IMPLANT
SET HNDPC FAN SPRY TIP SCT (DISPOSABLE) ×2 IMPLANT
SLING ARM IMMOBILIZER LRG (SOFTGOODS) ×3 IMPLANT
SLING ARM IMMOBILIZER XL (CAST SUPPLIES) ×2 IMPLANT
SPONGE T-LAP 18X18 ~~LOC~~+RFID (SPONGE) ×5 IMPLANT
STEM HUMERAL 3B LONG 98 (Stem) ×1 IMPLANT
STEM HUMERAL SZ 3B LONG 98MM (Stem) ×3 IMPLANT
STRIP CLOSURE SKIN 1/2X4 (GAUZE/BANDAGES/DRESSINGS) ×3 IMPLANT
SUCTION FRAZIER HANDLE 10FR (MISCELLANEOUS) ×3
SUCTION TUBE FRAZIER 10FR DISP (MISCELLANEOUS) ×1 IMPLANT
SUT ETHIBOND 2 V 37 (SUTURE) ×3 IMPLANT
SUT ETHIBOND NAB CT1 #1 30IN (SUTURE) ×3 IMPLANT
SUT FIBERWIRE #2 38 T-5 BLUE (SUTURE) ×3
SUT FIBERWIRE #5 38 CONV NDL (SUTURE) ×18
SUT MNCRL AB 3-0 PS2 18 (SUTURE) ×3 IMPLANT
SUT MNCRL AB 4-0 PS2 18 (SUTURE) ×2 IMPLANT
SUT VIC AB 2-0 CT1 27 (SUTURE) ×3
SUT VIC AB 2-0 CT1 TAPERPNT 27 (SUTURE) ×2 IMPLANT
SUT VIC AB 3-0 SH 27 (SUTURE) ×3
SUT VIC AB 3-0 SH 27X BRD (SUTURE) ×1 IMPLANT
SUTURE FIBERWR #2 38 T-5 BLUE (SUTURE) ×1 IMPLANT
SUTURE FIBERWR #5 38 CONV NDL (SUTURE) ×10 IMPLANT
TOWEL GREEN STERILE (TOWEL DISPOSABLE) ×3 IMPLANT
TRAY FOLEY W/BAG SLVR 14FR (SET/KITS/TRAYS/PACK) IMPLANT
WATER STERILE IRR 1000ML POUR (IV SOLUTION) ×3 IMPLANT

## 2020-10-25 NOTE — Anesthesia Procedure Notes (Signed)
Anesthesia Regional Block: Interscalene brachial plexus block   Pre-Anesthetic Checklist: , timeout performed,  Correct Patient, Correct Site, Correct Laterality,  Correct Procedure, Correct Position, site marked,  Risks and benefits discussed,  Surgical consent,  Pre-op evaluation,  At surgeon's request and post-op pain management  Laterality: Right  Prep: Maximum Sterile Barrier Precautions used, chloraprep       Needles:  Injection technique: Single-shot  Needle Type: Echogenic Stimulator Needle     Needle Length: 9cm  Needle Gauge: 22     Additional Needles:   Procedures:,,,, ultrasound used (permanent image in chart),,    Narrative:  Start time: 10/25/2020 12:05 PM End time: 10/25/2020 12:15 PM Injection made incrementally with aspirations every 5 mL.  Performed by: Personally  Anesthesiologist: Lannie Fields, DO  Additional Notes: Monitors applied. No increased pain on injection. No increased resistance to injection. Injection made in 5cc increments. Good needle visualization. Patient tolerated procedure well.

## 2020-10-25 NOTE — Discharge Instructions (Signed)
Ramond Marrow MD, MPH Alfonse Alpers, PA-C [facility] [address] [phone] (tel)   [phone] (fax)   POST-OPERATIVE INSTRUCTIONS - TOTAL SHOULDER REPLACEMENT    WOUND CARE You may leave the operative dressing in place until your follow-up appointment. KEEP THE INCISIONS CLEAN AND DRY. There may be a small amount of fluid/bleeding leaking at the surgical site. This is normal after surgery.  If it fills with liquid or blood please call us immediately to change it for you. Use the provided ice machine or Ice packs as often as possible for the first 3-4 days, then as needed for pain relief.  Keep a layer of cloth or a shirt between your skin and the cooling unit to prevent frost bite as it can get very cold.  SHOWERING: - You may shower on Post-Op Day #2.  - The dressing is water resistant but do not scrub it as it may start to peel up.   - You may remove the sling for showering, but keep a water resistant pillow under the arm to keep both the  elbow and shoulder away from the body (mimicking the abduction sling).  - Gently pat the area dry.  - Do not soak the shoulder in water. Do not go swimming in the pool or ocean until your sutures are removed. - KEEP THE INCISIONS CLEAN AND DRY.  EXERCISES Wear the sling at all times except when doing your exercises. You may remove the sling for showering, but keep the arm across the chest or in a secondary sling.    Accidental/Purposeful External Rotation and shoulder flexion (reaching behind you) is to be avoided at all costs for the first month. It is ok to come out of your sling if your are sitting and have assistance for eating.  Do not lift anything heavier than 1 pound until we discuss it further in clinic. Please perform the exercises:   Elbow / Hand / Wrist  Range of Motion Exercises Grip strengthening   REGIONAL ANESTHESIA (NERVE BLOCKS) The anesthesia team may have performed a nerve block for  you if safe in the setting of your care.  This is a great tool used to minimize pain.  Typically the block may start wearing off overnight but the long acting medicine may last for 3-4 days.  The nerve block wearing off can be a challenging period but please utilize your as needed pain medications to try and manage this period.    POST-OP MEDICATIONS- Multimodal approach to pain control In general your pain will be controlled with a combination of substances.  Prescriptions unless otherwise discussed are electronically sent to your pharmacy.  This is a carefully made plan we use to minimize narcotic use.     Meloxicam OR Celebrex - Anti-inflammatory medication taken on a scheduled basis Acetaminophen - Non-narcotic pain medicine taken on a scheduled basis  Oxycodone - This is a strong narcotic, to be used only on an "as needed" basis for pain. Aspirin 81mg  - This medicine is used to minimize the risk of blood clots after surgery. Omeprazole - daily medicine to protect your stomach while taking anti-inflammatories.  This may only be used in higher risk patients. Zofran -  take as needed for nausea Other: ____________  Meloxicam/Celebrex - these are anti-inflammatory and pain relievers.  Do not take additional ibuprofen, naproxen or other NSAID while taking this medicine.   FOLLOW-UP If you develop a Fever (>101.5), Redness or Drainage from the surgical  incision site, please call our office to arrange for an evaluation. Please call the office to schedule a follow-up appointment for a wound check, 7-10 days post-operatively.  IF YOU HAVE ANY QUESTIONS, PLEASE FEEL FREE TO CALL OUR OFFICE.  HELPFUL INFORMATION  If you had a block, it will wear off between 8-24 hrs postop typically.  This is period when your pain may go from nearly zero to the pain you would have had post-op without the block.  This is an abrupt transition but nothing dangerous is happening.  You may take an extra dose of narcotic  when this happens.  Your arm will be in a sling following surgery. You will be in this sling for the next 3-4 weeks.  I will let you know the exact duration at your follow-up visit.  You may be more comfortable sleeping in a semi-seated position the first few nights following surgery.  Keep a pillow propped under the elbow and forearm for comfort.  If you have a recliner type of chair it might be beneficial.  If not that is fine too, but it would be helpful to sleep propped up with pillows behind your operated shoulder as well under your elbow and forearm.  This will reduce pulling on the suture lines.  When dressing, put your operative arm in the sleeve first.  When getting undressed, take your operative arm out last.  Loose fitting, button-down shirts are recommended.  In most states it is against the law to drive while your arm is in a sling. And certainly against the law to drive while taking narcotics.  You may return to work/school in the next couple of days when you feel up to it. Desk work and typing in the sling is fine.  We suggest you use the pain medication the first night prior to going to bed, in order to ease any pain when the anesthesia wears off. You should avoid taking pain medications on an empty stomach as it will make you nauseous.  Do not drink alcoholic beverages or take illicit drugs when taking pain medications.  Pain medication may make you constipated.  Below are a few solutions to try in this order: Decrease the amount of pain medication if you aren't having pain. Drink lots of decaffeinated fluids. Drink prune juice and/or each dried prunes  If the first 3 don't work start with additional solutions Take Colace - an over-the-counter stool softener Take Senokot - an over-the-counter laxative Take Miralax - a stronger over-the-counter laxative   Dental Antibiotics:  In most cases prophylactic antibiotics for Dental procdeures after total joint surgery are not  necessary.  Exceptions are as follows:  1. History of prior total joint infection  2. Severely immunocompromised (Organ Transplant, cancer chemotherapy, Rheumatoid biologic meds such as Humera)  3. Poorly controlled diabetes (A1C &gt; 8.0, blood glucose over 200)  If you have one of these conditions, contact your surgeon for an antibiotic prescription, prior to your dental procedure.   For more information including helpful videos and documents visit our website:   [email or web address]

## 2020-10-25 NOTE — Interval H&P Note (Signed)
All questions answered

## 2020-10-25 NOTE — H&P (Signed)
PREOPERATIVE H&P  Chief Complaint: right proximal humerus fracture  HPI: Julie Burnett is a 50 y.o. female who presents for preoperative history and physical with a diagnosis of right proximal humerus fracture. Symptoms are rated as moderate to severe, and have been worsening.  This is significantly impairing activities of daily living.  Please see my clinic note for full details on this patient's care.  She has elected for surgical management.   Past Medical History:  Diagnosis Date   Alcohol dependence (HCC)    Anxiety    Back pain    Bipolar 1 disorder (HCC)    Depression    Diabetes mellitus    supposed to take metformin but does not take   Diabetes mellitus without complication (HCC)    Drug-seeking behavior    H/O suicide attempt    High cholesterol    Hypertension    Liver disease    Polysubstance abuse (HCC)    Past Surgical History:  Procedure Laterality Date   BACK SURGERY     CLOSED REDUCTION FINGER WITH PERCUTANEOUS PINNING Right 09/17/2015   Procedure: Right Index and Long Finger CLOSED REDUCTION AND  PERCUTANEOUS PINNING OF FRACTURES;  Surgeon: Betha Loa, MD;  Location: [facility];  Service: Orthopedics;  Laterality: Right;  Right Index and Long Finger CLOSED REDUCTION AND  PERCUTANEOUS PINNING OF FRACTURES   OPEN REDUCTION INTERNAL FIXATION (ORIF) DISTAL RADIAL FRACTURE Right 05/16/2015   Procedure: RIGHT OPEN REDUCTION INTERNAL FIXATION (ORIF) DISTAL RADIUS AND SCAPHOID ORIF;  Surgeon: Betha Loa, MD;  Location: [facility];  Service: Orthopedics;  Laterality: Right;   OPEN REDUCTION INTERNAL FIXATION (ORIF) METACARPAL Right 09/17/2015   Procedure: OPEN REDUCTION EXTERNAL FIXATION (ORIF) OF RIGHT RING FINGER;  Surgeon: Betha Loa, MD;  Location: [facility];  Service: Orthopedics;  Laterality: Right;  OPEN REDUCTION EXTERNAL FIXATION (ORIF) OF RIGHT RING FINGER   ORIF SCAPHOID FRACTURE Right 05/16/2015   Procedure: RIGHT OPEN  REDUCTION INTERNAL FIXATION (ORIF) SCAPHOID FRACTURE;  Surgeon: Betha Loa, MD;  Location: [facility];  Service: Orthopedics;  Laterality: Right;   Social History   Socioeconomic History   Marital status: Single    Spouse name: Not on file   Number of children: Not on file   Years of education: Not on file   Highest education level: Not on file  Occupational History   Occupation: Unemployed  Tobacco Use   Smoking status: Every Day    Packs/day: 1.00    Years: 17.00    Pack years: 17.00    Types: Cigarettes   Smokeless tobacco: Never  Vaping Use   Vaping Use: Never used  Substance and Sexual Activity   Alcohol use: Yes    Alcohol/week: 24.0 standard drinks    Types: 24 Cans of beer per week    Comment: 2 cases of beer daily    Drug use: Yes    Types: Marijuana, "Crack" cocaine, Amphetamines    Comment: "Every day if i can i use marijuana"   Sexual activity: Yes    Birth control/protection: Condom  Other Topics Concern   Not on file  Social History Narrative   ** Merged History Encounter **    Pt stated initially that she is homeless, and then she stated that she lives with her new boyfriend.  Pt also reported that she used to be followed by [facility], but is no longer being treated there.   Social Determinants of Health   Financial  Resource Strain: Not on file  Food Insecurity: Not on file  Transportation Needs: Not on file  Physical Activity: Not on file  Stress: Not on file  Social Connections: Not on file   History reviewed. No pertinent family history. Allergies  Allergen Reactions   Naprosyn [Naproxen] Hives and Itching   Codeine Itching and Rash   Prior to Admission medications   Medication Sig Start Date End Date Taking? Authorizing Provider  gabapentin (NEURONTIN) 300 MG capsule Take 300 mg by mouth 3 (three) times daily.   Yes [provider]  metFORMIN (GLUCOPHAGE) 500 MG tablet Take 500 mg by mouth daily with breakfast.    Yes [provider]  oxyCODONE-acetaminophen (PERCOCET/ROXICET) 5-325 MG tablet Take 1-2 tablets by mouth every 6 (six) hours as needed for severe pain. 10/23/20  Yes Oliver Barre, MD     Positive ROS: All other systems have been reviewed and were otherwise negative with the exception of those mentioned in the HPI and as above.  Physical Exam: General: Alert, no acute distress Cardiovascular: No pedal edema Respiratory: No cyanosis, no use of accessory musculature GI: No organomegaly, abdomen is soft and non-tender Skin: No lesions in the area of chief complaint Neurologic: Sensation intact distally Psychiatric: Patient is competent for consent with normal mood and affect Lymphatic: No axillary or cervical lymphadenopathy  MUSCULOSKELETAL: axillary nerve sensation altered, reportedly whole hand sensory altered, +AIN/PIN/Ulnar motor, wwp hand.  No Rom in setting of known fracture  Assessment: right proximal humerus fracture  Plan: Plan for Procedure(s): OPEN REDUCTION INTERNAL FIXATION (ORIF) DISTAL HUMERUS FRACTURE/ HEMI REVERSE SHOULDER ARTHROPLASTY   Patient is at extrodinarily high risk of complications due to medical and social issues.  Her use of narcotics is also worrisome but unfortunately she has a fracture of necessity.  We feel that without surgery she will predictably be functional limited for life but has a chance to improve with surgery.  She and her family understand the risks and benefits.  All questions answered.  The risks benefits and alternatives were discussed with the patient including but not limited to the risks of nonoperative treatment, versus surgical intervention including infection, bleeding, nerve injury,  blood clots, cardiopulmonary complications, morbidity, mortality, among others, and they were willing to proceed.   Bjorn Pippin, MD  10/25/2020 6:34 AM

## 2020-10-25 NOTE — Op Note (Signed)
Orthopaedic Surgery Operative Note (CSN: [id number])  Julie Burnett  Feb 09, 1970 Date of Surgery: 10/25/2020   Diagnoses:  Right shoulder non-union proximal humerus   Procedure: Right shoulder hemiarthroplasty  Right shoulder open treatment of nonunion of humerus with autograft Open reduction internal fixation of tuberosity fracture   Operative Finding Successful completion of planned procedure.  Patient's humeral head was split and had a malunion/nonunion progressing.  We were able to extricate the head fragments and though she had eroded away significant amount of it was able to harvest bone graft and autograft bone grafting at the side of the tuberosity fixation.  We able to get robust security of the tuberosities however due to the patient's bone loss the greater appeared somewhat over reduced though we were very happy with our closure.  Interval closure went well.  We stayed within the head as we extracted the fragments to avoid damage to the axillary nerve which had a normal tug test.  We felt that by staying within the fracture fragments we avoided damage to the surrounding neurovascular structures.  We used nylon in the skin as the patient has a history of infection as well as substance abuse which made [place] worry about her ability to heal with absorbable suture.  Post-operative plan: The patient will be NWB in sling.  The patient will be will be discharged from PACU if continues to be stable as was plan prior to surgery.  DVT prophylaxis Aspirin 81 mg twice daily for 6 weeks.  Pain control with PRN pain medication preferring oral medicines.  Follow up plan will be scheduled in approximately 7 days for incision check and XR.  Physical therapy to start after first visit.  Implants: Tornier size 3B long flex stem, low offset 43 head  Post-Op Diagnosis: Same Surgeons:Primary: Bjorn Pippin, MD Assistants:Caroline McBane PA-C Location: [facility] OR ROOM 09 Anesthesia: General with Exparel  Interscalene Antibiotics: Ancef 2g preop, Vancomycin 1000mg  locally, 1.2 g tobramycin powder Tourniquet time: None Estimated Blood Loss: 100 Complications: None Specimens: None Implants: Implant Name Type Inv. Item Serial No. Manufacturer Lot No. LRB No. Used Action  STEM HUMERAL SZ 3B LONG - Stem STEM HUMERAL SZ 3B LONG [id number]  [employer]  Right 1 Implanted  HEAD HUMERAL [id number] COBALT - Miscellaneous HEAD HUMERAL [id number] COBALT  [employer]  Right 1 Implanted    Indications for Surgery:   Julie Burnett is a 50 y.o. female with fall and chronic malunion/nonunion of the proximal humerus 3 to 4 weeks out from injury.  She had extensive callus formation already about a head split fracture and clear inability for this to heal in a functional place.  Benefits and risks of operative and nonoperative management were discussed prior to surgery with patient/guardian(s) and informed consent form was completed.  Infection and need for further surgery were discussed as was prosthetic stability and cuff issues.  We additionally specifically discussed risks of axillary nerve injury, infection, periprosthetic fracture, continued pain and longevity of implants prior to beginning procedure.      Procedure:   The patient was identified in the preoperative holding area where the surgical site was marked. Block placed by anesthesia with exparel.  The patient was taken to the OR where a procedural timeout was called and the above noted anesthesia was induced.  The patient was positioned beachchair on allen table with spider arm positioner.  Preoperative antibiotics were dosed.  The patient's right shoulder was prepped and draped in the usual sterile  fashion.  A second preoperative timeout was called.       Standard deltopectoral approach was performed with a #10 blade. We dissected down to the subcutaneous tissues and the cephalic vein was taken laterally with the deltoid. Clavipectoral fascia  was incised in line with the incision. Deep retractors were placed. The long of the biceps tendon was identified and there was significant tenosynovitis present.  Tenodesis was performed to the pectoralis tendon with #2 Ethibond. The remaining biceps was followed up into the rotator interval where it was released.    We used the bicipital groove as a landmark for the lesser and greater tuberosity fragments.  Due to the complex malunion/nonunion changing the anatomy we felt that it is safest to open the fracture fragments at the bicipital groove and we did this with a large osteotome.  We are able to elevate the lesser tuberosity and mobilize this securing it with #2 Ethibond.  We then were able to identify the head fragments which were multiple as the patient had a head split.  We then were able to extricate these preserving the greater tuberosity.  This point we were able to identify the  greater tuberosity fragment and 5 #5  FiberWire sutures were used to place into this for eventual repair of the tuberosities.  Once these were both mobilized we took care to identify the shaft fragment as well as the head fragment.  We carefully identified the head fragment were able to manually remove it.  At this point the axillary nerve was found and palpated and with a tug test noted to be intact.  Protected throughout the remainder of the case with blunt retractors.    We then released the SGHL with bovie cautery prior to placing a curved mayo at the junction of the anterior glenoid well above the axillary nerve and bluntly dissecting the subscapularis from the capsule.  We then carefully protected the axillary nerve as we gently released the inferior capsule to fully mobilize the subscapularis.  An anterior deltoid retractor was then placed as well as a small Hohmann retractor superiorly.   The glenoid was relatively preserved as we would expect in this fracture patient.     We then repositioned the arm to give  access to the humeral shaft fragment.  Drill holes were placed and fiberwire sutures in the shaft for vertical fixation of the tuberosities.  We broached starting with a size one broach and broaching up to 3 which obtained an appropriate fit above the pec about 4-1/2 to 5 cm above the pec.  We had robust fixation we did not feel that cement was necessary.    We trialed multiple sizes found to the 43 recreated the patient's native anatomy well but due to her chronic deformity we felt that the greater tuberosity had a reasonable risk of over reducing.  But felt that it was appropriate with bone graft to perform a hemiarthroplasty.    We placed our implants and had good fixation.  We were able to form an open reduction internal fixation of the tuberosities by using the FiberWire's to cerclage around.  We had a vertical stabilization stitch was also passed to secure it.   The joint was reduced and thoroughly irrigated with pulsatile lavage. The remaining sutures were then placed through the subscapularis and the bone tendon junction and the tuberosities were reduced after bone graft placed beneath as autograft at the subscap.  We horizontally secured the tuberosities before placing vertical fixation with  the suture that was placed into the shaft.  Tuberosities moved as a unit were happy with her overall reduction.  This was checked on fluoroscopy confirming our position.  We irrigated copiously at this point.  Hemostasis was obtained. The deltopectoral interval was reapproximated with #1 Ethibond. The subcutaneous tissues were closed with 3-0 Vicryl and 3-0 nylon in the skin due to the patient's history of alcoholism.   The wounds were cleaned and dried and an Aquacel dressing was placed. The drapes taken down. The arm was placed into sling with abduction pillow. Patient was awakened, extubated, and transferred to the recovery room in stable condition. There were no intraoperative complications. The sponge,  needle, and attention counts were correct at the end of the case.     Alfonse Alpers, PA-C, present and scrubbed throughout the case, critical for completion in a timely fashion, and for retraction, instrumentation, closure.

## 2020-10-25 NOTE — Transfer of Care (Signed)
Immediate Anesthesia Transfer of Care Note  Patient: Julie Burnett  Procedure(s) Performed: SHOULDER HEMI-ARTHROPLASTY (Right: Shoulder)  Patient Location: PACU  Anesthesia Type:General  Level of Consciousness: awake, alert  and oriented  Airway & Oxygen Therapy: Patient Spontanous Breathing  Post-op Assessment: Report given to RN  Post vital signs: Reviewed and stable  Last Vitals:  Vitals Value Taken Time  BP 115/64 10/25/20 1444  Temp    Pulse    Resp 25 10/25/20 1444  SpO2 100   Vitals shown include unvalidated device data.  Last Pain:  Vitals:   10/25/20 1205  TempSrc:   PainSc: 0-No pain      Patients Stated Pain Goal: 3 (10/25/20 1054)  Complications: No notable events documented.

## 2020-10-25 NOTE — Anesthesia Postprocedure Evaluation (Signed)
Anesthesia Post Note  Patient: Julie Burnett  Procedure(s) Performed: SHOULDER HEMI-ARTHROPLASTY (Right: Shoulder)     Patient location during evaluation: PACU Anesthesia Type: Regional and General Level of consciousness: awake and alert, oriented and patient cooperative Pain management: pain level controlled Vital Signs Assessment: post-procedure vital signs reviewed and stable Respiratory status: spontaneous breathing, nonlabored ventilation and respiratory function stable Cardiovascular status: blood pressure returned to baseline and stable Postop Assessment: no apparent nausea or vomiting Anesthetic complications: no   No notable events documented.  Last Vitals:  Vitals:   10/25/20 1517 10/25/20 1530  BP: 118/63 (!) 144/83  Pulse: 63 66  Resp: 20 18  Temp:  36.8 C  SpO2: 98% 100%    Last Pain:  Vitals:   10/25/20 1530  TempSrc:   PainSc: 0-No pain                 Lannie Fields

## 2020-10-25 NOTE — Anesthesia Procedure Notes (Signed)
Procedure Name: Intubation Date/Time: 10/25/2020 12:52 PM Performed by: Barrington Ellison, CRNA Pre-anesthesia Checklist: Patient identified, Emergency Drugs available, Suction available and Patient being monitored Patient Re-evaluated:Patient Re-evaluated prior to induction Oxygen Delivery Method: Circle System Utilized Preoxygenation: Pre-oxygenation with 100% oxygen Induction Type: IV induction Ventilation: Mask ventilation without difficulty Laryngoscope Size: Mac and 3 Grade View: Grade I Tube type: Oral Tube size: 7.0 mm Number of attempts: 1 Airway Equipment and Method: Stylet and Oral airway Placement Confirmation: ETT inserted through vocal cords under direct vision, positive ETCO2 and breath sounds checked- equal and bilateral Secured at: 21 cm Tube secured with: Tape Dental Injury: Teeth and Oropharynx as per pre-operative assessment
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
## 2020-10-25 NOTE — Progress Notes (Signed)
Orthopedic Tech Progress Note Patient Details:  Julie Burnett [birth year] [id number]  Ortho Devices Type of Ortho Device: Abduction pillow Ortho Device/Splint Interventions: Ordered      Pema Thomure A Sunya Humbarger 10/25/2020, 2:10 PM

## 2020-10-29 ENCOUNTER — Encounter (HOSPITAL_COMMUNITY): Payer: Self-pay | Admitting: Orthopaedic Surgery

## 2020-12-02 ENCOUNTER — Emergency Department (HOSPITAL_COMMUNITY)
Admission: EM | Admit: 2020-12-02 | Discharge: 2020-12-02 | Disposition: A | Discharge status: Home / Self Care | Payer: Medicaid Other | Attending: Emergency Medicine | Admitting: Emergency Medicine

## 2020-12-02 DIAGNOSIS — Z79899 Other long term (current) drug therapy: Secondary | ICD-10-CM | POA: Insufficient documentation

## 2020-12-02 DIAGNOSIS — E119 Type 2 diabetes mellitus without complications: Secondary | ICD-10-CM | POA: Insufficient documentation

## 2020-12-02 DIAGNOSIS — S90822A Blister (nonthermal), left foot, initial encounter: Secondary | ICD-10-CM | POA: Insufficient documentation

## 2020-12-02 DIAGNOSIS — S60522A Blister (nonthermal) of left hand, initial encounter: Secondary | ICD-10-CM | POA: Insufficient documentation

## 2020-12-02 DIAGNOSIS — S60521A Blister (nonthermal) of right hand, initial encounter: Secondary | ICD-10-CM | POA: Insufficient documentation

## 2020-12-02 DIAGNOSIS — Z7984 Long term (current) use of oral hypoglycemic drugs: Secondary | ICD-10-CM | POA: Insufficient documentation

## 2020-12-02 DIAGNOSIS — S90821A Blister (nonthermal), right foot, initial encounter: Secondary | ICD-10-CM | POA: Insufficient documentation

## 2020-12-02 DIAGNOSIS — F17210 Nicotine dependence, cigarettes, uncomplicated: Secondary | ICD-10-CM | POA: Insufficient documentation

## 2020-12-02 DIAGNOSIS — I10 Essential (primary) hypertension: Secondary | ICD-10-CM | POA: Insufficient documentation

## 2020-12-02 DIAGNOSIS — Z23 Encounter for immunization: Secondary | ICD-10-CM | POA: Insufficient documentation

## 2020-12-02 DIAGNOSIS — X000XXA Exposure to flames in uncontrolled fire in building or structure, initial encounter: Secondary | ICD-10-CM | POA: Insufficient documentation

## 2020-12-02 DIAGNOSIS — Z7982 Long term (current) use of aspirin: Secondary | ICD-10-CM | POA: Insufficient documentation

## 2020-12-02 DIAGNOSIS — Z96611 Presence of right artificial shoulder joint: Secondary | ICD-10-CM | POA: Insufficient documentation

## 2020-12-02 DIAGNOSIS — T300 Burn of unspecified body region, unspecified degree: Secondary | ICD-10-CM

## 2020-12-02 MED ORDER — OXYCODONE-ACETAMINOPHEN 5-325 MG PO TABS
2.0000 | ORAL_TABLET | Freq: Once | ORAL | Status: AC
Start: 2020-12-02 — End: 2020-12-02
  Administered 2020-12-02: 2 via ORAL
  Filled 2020-12-02: qty 2

## 2020-12-02 MED ORDER — TETANUS-DIPHTH-ACELL PERTUSSIS 5-2.5-18.5 LF-MCG/0.5 IM SUSY
0.5000 mL | PREFILLED_SYRINGE | Freq: Once | INTRAMUSCULAR | Status: AC
  Administered 2020-12-02: 0.5 mL via INTRAMUSCULAR
  Filled 2020-12-02: qty 0.5

## 2020-12-02 MED ORDER — OXYCODONE-ACETAMINOPHEN 10-325 MG PO TABS: 1.0000 | ORAL_TABLET | Freq: Four times a day (QID) | ORAL | 0 refills | Status: AC | PRN
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
## 2020-12-02 NOTE — Discharge Instructions (Addendum)
Return if any problems.  Recheck at Urgent care in 2 days.

## 2021-02-10 IMAGING — DX DG CHEST 2V
2 series · 2 of 2 positions shown · non-contrast
Comparison: 07/03/2012

CLINICAL DATA: Chest pain this morning.

EXAM:
CHEST - 2 VIEW

[chest pa]
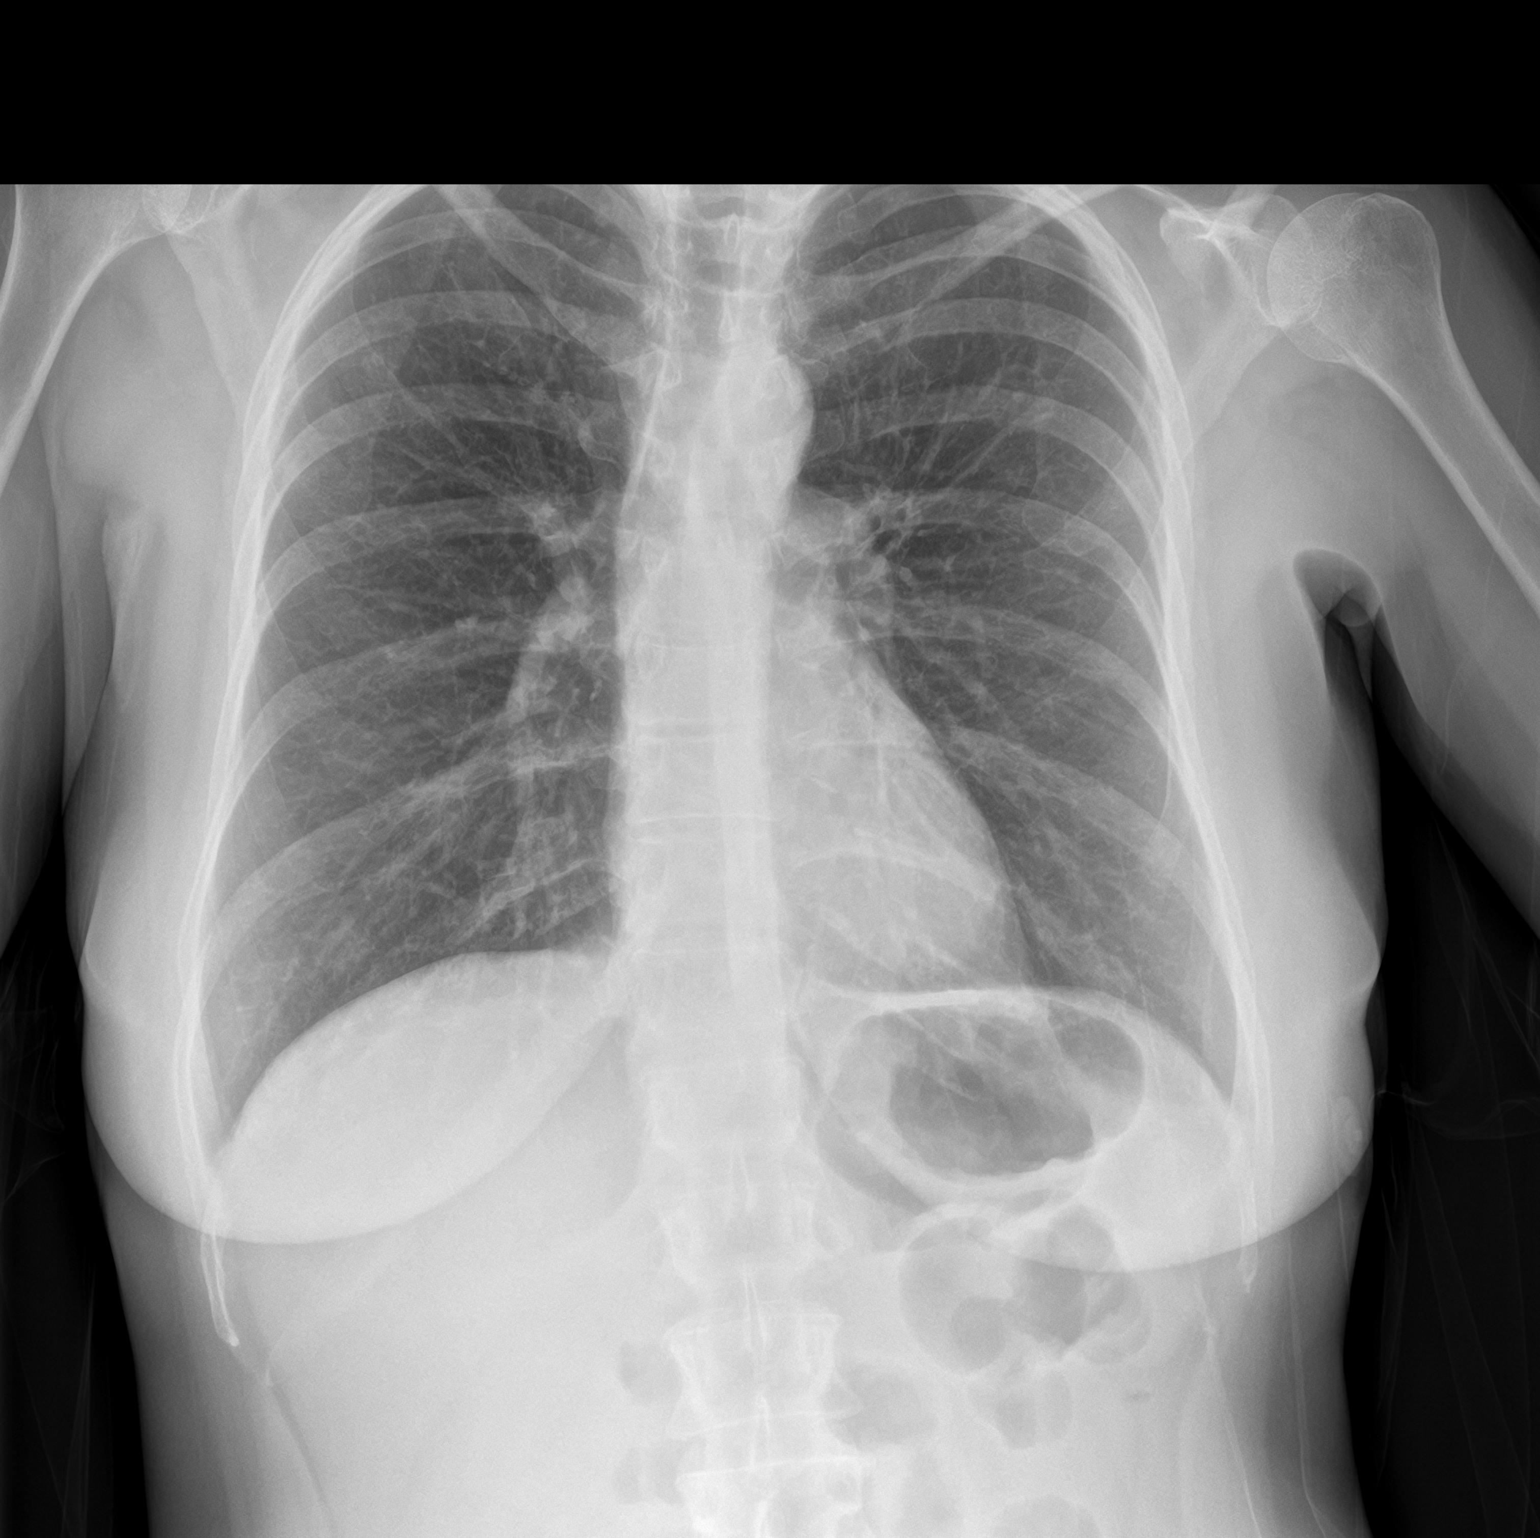

[chest lat]
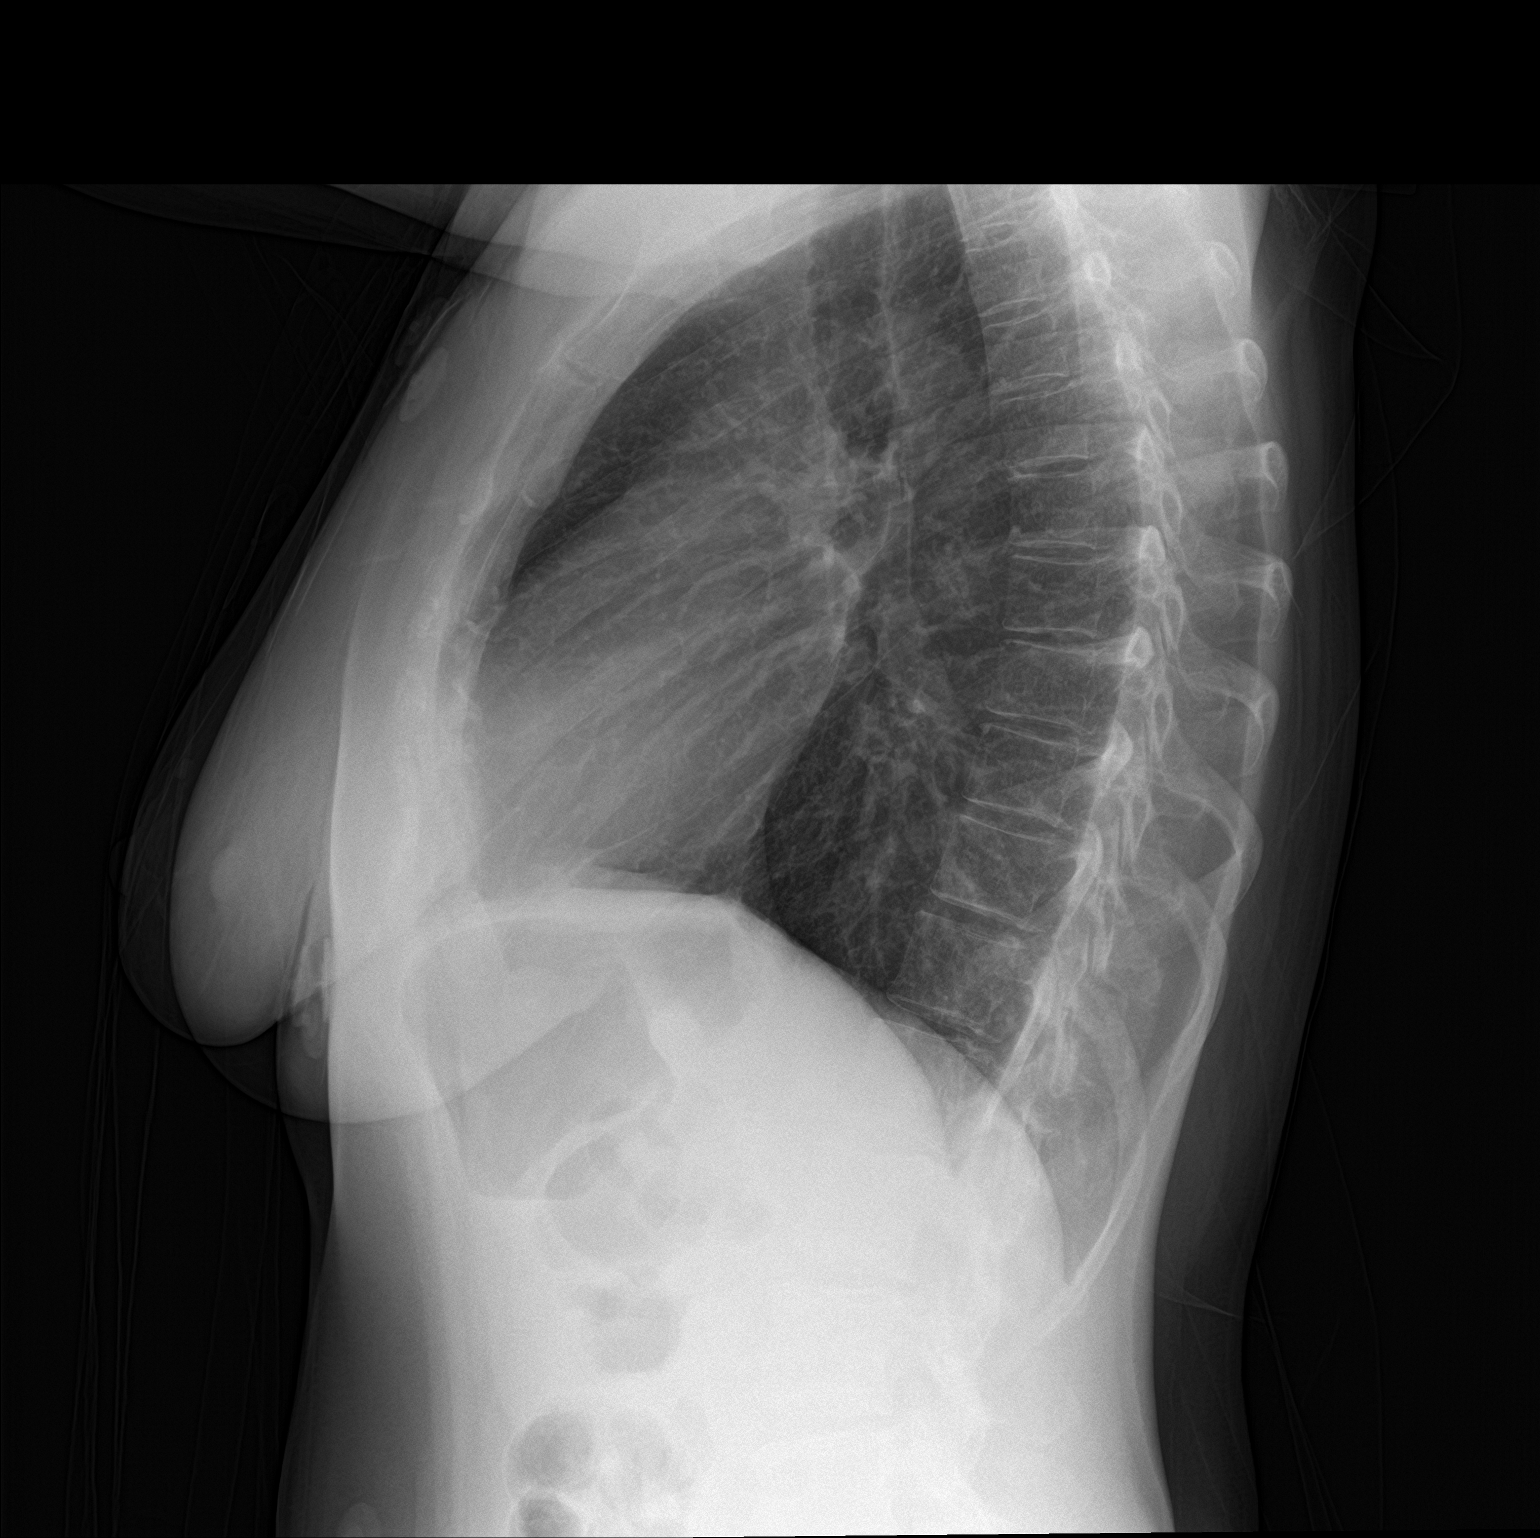

[2 of 2 positions shown; findings below may reference images not displayed]

FINDINGS: The cardiac silhouette, mediastinal and hilar contours are normal.
The lungs are clear. The bony thorax is intact.
IMPRESSION: Normal chest x-ray.

## 2021-03-03 ENCOUNTER — Encounter (HOSPITAL_COMMUNITY): Payer: Self-pay | Admitting: Emergency Medicine

## 2021-03-03 ENCOUNTER — Emergency Department (HOSPITAL_COMMUNITY)
Admission: EM | Admit: 2021-03-03 | Discharge: 2021-03-03 | Discharge status: Against Medical Advice | Payer: Medicaid Other | Attending: Emergency Medicine | Admitting: Emergency Medicine

## 2021-03-03 DIAGNOSIS — Z5321 Procedure and treatment not carried out due to patient leaving prior to being seen by health care provider: Secondary | ICD-10-CM | POA: Insufficient documentation

## 2021-03-03 DIAGNOSIS — M25519 Pain in unspecified shoulder: Secondary | ICD-10-CM | POA: Insufficient documentation

## 2021-03-03 DIAGNOSIS — G8929 Other chronic pain: Secondary | ICD-10-CM | POA: Insufficient documentation

## 2021-03-03 LAB — CBG MONITORING, ED: Glucose-Capillary: 140 mg/dL — ABNORMAL HIGH (ref 70–99)

## 2021-03-03 NOTE — ED Provider Notes (Signed)
Patient eloped from the emergency department after triage and I was unable to obtain a history or perform a physical exam.  I did review her vital signs and nursing triage notes and I did observe her walking out of the emergency department in no distress.  Elmer Sow. Pilar Plate, MD [facility] [email or web address]    Sabas Sous, MD 03/03/21 [phone]

## 2021-03-03 NOTE — ED Triage Notes (Signed)
Pt c/o chronic shoulder pain, states she had shoulder surgery about 2 months ago. Pt states she doesn't have a PCP right now, states she is out of her "oxy 10".

## 2021-03-05 ENCOUNTER — Other Ambulatory Visit: Payer: Self-pay

## 2021-03-05 ENCOUNTER — Emergency Department (HOSPITAL_COMMUNITY)
Admission: EM | Admit: 2021-03-05 | Discharge: 2021-03-05 | Disposition: A | Discharge status: Home / Self Care | Payer: Self-pay | Attending: Emergency Medicine | Admitting: Emergency Medicine

## 2021-03-05 ENCOUNTER — Emergency Department (HOSPITAL_COMMUNITY): Payer: Self-pay

## 2021-03-05 ENCOUNTER — Encounter (HOSPITAL_COMMUNITY): Payer: Self-pay

## 2021-03-05 DIAGNOSIS — Z5321 Procedure and treatment not carried out due to patient leaving prior to being seen by health care provider: Secondary | ICD-10-CM | POA: Insufficient documentation

## 2021-03-05 DIAGNOSIS — M25511 Pain in right shoulder: Secondary | ICD-10-CM | POA: Insufficient documentation

## 2021-03-05 DIAGNOSIS — W19XXXA Unspecified fall, initial encounter: Secondary | ICD-10-CM | POA: Insufficient documentation

## 2021-03-05 MED ORDER — OXYCODONE-ACETAMINOPHEN 5-325 MG PO TABS
2.0000 | ORAL_TABLET | Freq: Once | ORAL | Status: AC
  Administered 2021-03-05: 2 via ORAL
  Filled 2021-03-05: qty 2

## 2021-03-05 NOTE — ED Triage Notes (Signed)
Patient complaining of cold like symptoms that started yesterday also had a fall and has right shoulder pain.

## 2021-05-05 ENCOUNTER — Telehealth: Payer: Self-pay

## 2021-05-05 NOTE — Telephone Encounter (Signed)
Telephoned patient at mobile number. Daughter answered, left message with [facility] contact information. ?

## 2021-06-09 DIAGNOSIS — L0211 Cutaneous abscess of neck: Secondary | ICD-10-CM | POA: Diagnosis not present

## 2021-06-09 DIAGNOSIS — K047 Periapical abscess without sinus: Secondary | ICD-10-CM | POA: Diagnosis not present

## 2021-07-07 NOTE — Telephone Encounter (Signed)
Telephoned patient at mobile, her daughter answered. Left a message with [facility] contact information.

## 2021-11-20 DIAGNOSIS — Z6828 Body mass index (BMI) 28.0-28.9, adult: Secondary | ICD-10-CM | POA: Diagnosis not present

## 2021-11-20 DIAGNOSIS — I10 Essential (primary) hypertension: Secondary | ICD-10-CM | POA: Diagnosis not present

## 2021-11-20 DIAGNOSIS — E7849 Other hyperlipidemia: Secondary | ICD-10-CM | POA: Diagnosis not present

## 2021-11-20 DIAGNOSIS — E1140 Type 2 diabetes mellitus with diabetic neuropathy, unspecified: Secondary | ICD-10-CM | POA: Diagnosis not present

## 2021-11-26 DIAGNOSIS — E7849 Other hyperlipidemia: Secondary | ICD-10-CM | POA: Diagnosis not present

## 2021-11-26 DIAGNOSIS — E1140 Type 2 diabetes mellitus with diabetic neuropathy, unspecified: Secondary | ICD-10-CM | POA: Diagnosis not present

## 2021-11-26 DIAGNOSIS — I10 Essential (primary) hypertension: Secondary | ICD-10-CM | POA: Diagnosis not present

## 2021-12-02 ENCOUNTER — Encounter: Payer: Self-pay | Admitting: *Deleted

## 2022-02-09 DIAGNOSIS — J449 Chronic obstructive pulmonary disease, unspecified: Secondary | ICD-10-CM | POA: Diagnosis not present

## 2022-02-09 DIAGNOSIS — R9431 Abnormal electrocardiogram [ECG] [EKG]: Secondary | ICD-10-CM | POA: Diagnosis not present

## 2022-02-09 DIAGNOSIS — Z5900 Homelessness unspecified: Secondary | ICD-10-CM | POA: Diagnosis not present

## 2022-02-09 DIAGNOSIS — R45851 Suicidal ideations: Secondary | ICD-10-CM | POA: Diagnosis not present

## 2022-02-09 DIAGNOSIS — F4320 Adjustment disorder, unspecified: Secondary | ICD-10-CM | POA: Diagnosis not present

## 2022-02-09 DIAGNOSIS — F17210 Nicotine dependence, cigarettes, uncomplicated: Secondary | ICD-10-CM | POA: Diagnosis not present

## 2022-02-09 DIAGNOSIS — F329 Major depressive disorder, single episode, unspecified: Secondary | ICD-10-CM | POA: Diagnosis not present

## 2022-02-09 DIAGNOSIS — E7800 Pure hypercholesterolemia, unspecified: Secondary | ICD-10-CM | POA: Diagnosis not present

## 2022-02-09 DIAGNOSIS — Z79899 Other long term (current) drug therapy: Secondary | ICD-10-CM | POA: Diagnosis not present

## 2022-02-09 DIAGNOSIS — E119 Type 2 diabetes mellitus without complications: Secondary | ICD-10-CM | POA: Diagnosis not present

## 2022-02-09 DIAGNOSIS — F1299 Cannabis use, unspecified with unspecified cannabis-induced disorder: Secondary | ICD-10-CM | POA: Diagnosis not present

## 2022-02-09 DIAGNOSIS — F10129 Alcohol abuse with intoxication, unspecified: Secondary | ICD-10-CM | POA: Diagnosis not present

## 2022-02-09 DIAGNOSIS — E876 Hypokalemia: Secondary | ICD-10-CM | POA: Diagnosis not present

## 2022-02-09 DIAGNOSIS — R45850 Homicidal ideations: Secondary | ICD-10-CM | POA: Diagnosis not present

## 2022-02-09 DIAGNOSIS — I10 Essential (primary) hypertension: Secondary | ICD-10-CM | POA: Diagnosis not present

## 2022-05-19 ENCOUNTER — Encounter: Payer: Self-pay | Admitting: *Deleted

## 2023-07-06 ENCOUNTER — Encounter (INDEPENDENT_AMBULATORY_CARE_PROVIDER_SITE_OTHER): Payer: Self-pay | Admitting: *Deleted

## 2023-10-19 ENCOUNTER — Telehealth: Payer: Self-pay

## 2023-10-19 NOTE — Telephone Encounter (Signed)
 Who is your primary care physician: Dr.Xaje  Reasons for the colonoscopy: Screening  Have you had a colonoscopy before?  no  Do you have family history of colon cancer? Yes sister  Previous colonoscopy with polyps removed? no  Do you have a history colorectal cancer?   no  Are you diabetic? If yes, Type 1 or Type 2?    Yes type 2  Do you have a prosthetic or mechanical heart valve? no  Do you have a pacemaker/defibrillator?   no  Have you had endocarditis/atrial fibrillation? no  Have you had joint replacement within the last 12 months?  no  Do you tend to be constipated or have to use laxatives? no  Do you have any history of drugs or alchohol?  yes  Do you use supplemental oxygen?  no  Have you had a stroke or heart attack within the last 6 months? no  Do you take weight loss medication?  no  For female patients: have you had a hysterectomy?  no                                     are you post menopausal?       yes                                            do you still have your menstrual cycle? no      Do you take any blood-thinning medications such as: (aspirin , warfarin, Plavix, Aggrenox)  no  If yes we need the name, milligram, dosage and who is prescribing doctor  Current Outpatient Medications on File Prior to Visit  Medication Sig Dispense Refill   escitalopram (LEXAPRO) 20 MG tablet Take 20 mg by mouth daily.     gabapentin  (NEURONTIN ) 300 MG capsule Take 300 mg by mouth 3 (three) times daily.     metFORMIN  (GLUCOPHAGE ) 500 MG tablet Take 500 mg by mouth daily with breakfast.     No current facility-administered medications on file prior to visit.    Allergies  Allergen Reactions   Naprosyn  [Naproxen ] Hives and Itching   Codeine Itching and Rash     Pharmacy: [facility]  Primary Insurance Name: Medicaid [id number] K  Best number where you can be reached: [phone]

## 2023-10-26 NOTE — Telephone Encounter (Signed)
 Any room Thanks

## 2023-10-27 NOTE — Telephone Encounter (Signed)
 LMOVM to call back

## 2023-10-28 NOTE — Telephone Encounter (Signed)
 ATC pt, no answer. LVM for call back.

## 2023-10-28 NOTE — Telephone Encounter (Addendum)
 LMOVM to call back

## 2023-12-21 ENCOUNTER — Encounter: Payer: Self-pay | Admitting: *Deleted

## 2023-12-21 ENCOUNTER — Other Ambulatory Visit: Payer: Self-pay | Admitting: *Deleted

## 2023-12-21 MED ORDER — PEG 3350-KCL-NA BICARB-NACL 420 G PO SOLR: 4000.0000 mL | Freq: Once | ORAL | 0 refills | Status: AC

## 2023-12-21 NOTE — Telephone Encounter (Signed)
 Pt has been scheduled for 12/29/23. Instructions printed for pt to come by office to pick up instructions and prep sent to pharmacy

## 2023-12-23 ENCOUNTER — Encounter (INDEPENDENT_AMBULATORY_CARE_PROVIDER_SITE_OTHER): Payer: Self-pay | Admitting: *Deleted

## 2023-12-23 NOTE — Telephone Encounter (Signed)
 Referral completed, [facility] apt letter sent to PCP

## 2023-12-29 ENCOUNTER — Encounter (HOSPITAL_COMMUNITY): Payer: Self-pay | Admitting: Gastroenterology

## 2023-12-29 ENCOUNTER — Ambulatory Visit (HOSPITAL_COMMUNITY): Payer: MEDICAID | Admitting: Certified Registered"

## 2023-12-29 ENCOUNTER — Encounter (HOSPITAL_COMMUNITY): Admission: RE | Disposition: A | Payer: Self-pay | Attending: Gastroenterology

## 2023-12-29 ENCOUNTER — Ambulatory Visit (HOSPITAL_COMMUNITY)
Admission: RE | Admit: 2023-12-29 | Discharge: 2023-12-29 | Disposition: A | Discharge status: Home / Self Care | Payer: MEDICAID | Attending: Gastroenterology | Admitting: Gastroenterology

## 2023-12-29 ENCOUNTER — Other Ambulatory Visit: Payer: Self-pay

## 2023-12-29 DIAGNOSIS — E119 Type 2 diabetes mellitus without complications: Secondary | ICD-10-CM | POA: Insufficient documentation

## 2023-12-29 DIAGNOSIS — D125 Benign neoplasm of sigmoid colon: Secondary | ICD-10-CM | POA: Diagnosis not present

## 2023-12-29 DIAGNOSIS — K635 Polyp of colon: Secondary | ICD-10-CM | POA: Diagnosis not present

## 2023-12-29 DIAGNOSIS — F17210 Nicotine dependence, cigarettes, uncomplicated: Secondary | ICD-10-CM | POA: Diagnosis not present

## 2023-12-29 DIAGNOSIS — I10 Essential (primary) hypertension: Secondary | ICD-10-CM

## 2023-12-29 DIAGNOSIS — F17200 Nicotine dependence, unspecified, uncomplicated: Secondary | ICD-10-CM | POA: Insufficient documentation

## 2023-12-29 DIAGNOSIS — Z800 Family history of malignant neoplasm of digestive organs: Secondary | ICD-10-CM | POA: Insufficient documentation

## 2023-12-29 DIAGNOSIS — F418 Other specified anxiety disorders: Secondary | ICD-10-CM | POA: Diagnosis not present

## 2023-12-29 DIAGNOSIS — Z1211 Encounter for screening for malignant neoplasm of colon: Secondary | ICD-10-CM

## 2023-12-29 DIAGNOSIS — K5730 Diverticulosis of large intestine without perforation or abscess without bleeding: Secondary | ICD-10-CM | POA: Insufficient documentation

## 2023-12-29 HISTORY — PX: POLYPECTOMY: SHX149

## 2023-12-29 HISTORY — PX: COLONOSCOPY: SHX5424

## 2023-12-29 LAB — HM COLONOSCOPY

## 2023-12-29 LAB — GLUCOSE, CAPILLARY: Glucose-Capillary: 115 mg/dL — ABNORMAL HIGH (ref 70–99)

## 2023-12-29 SURGERY — COLONOSCOPY
Anesthesia: Monitor Anesthesia Care

## 2023-12-29 MED ORDER — LIDOCAINE HCL (CARDIAC) PF 100 MG/5ML IV SOSY
PREFILLED_SYRINGE | INTRAVENOUS | Status: DC | PRN
  Administered 2023-12-29: 09:00:00 50 mg via INTRAVENOUS

## 2023-12-29 MED ORDER — STERILE WATER FOR IRRIGATION IR SOLN
Status: DC | PRN
  Administered 2023-12-29: 09:00:00 60 mL

## 2023-12-29 MED ORDER — LACTATED RINGERS IV SOLN: INTRAVENOUS | Status: DC | PRN

## 2023-12-29 MED ORDER — PROPOFOL 500 MG/50ML IV EMUL
INTRAVENOUS | Status: DC | PRN
  Administered 2023-12-29: 09:00:00 175 ug/kg/min via INTRAVENOUS
  Administered 2023-12-29: 09:00:00 100 mg via INTRAVENOUS

## 2023-12-29 NOTE — Op Note (Signed)
 [facility] Patient Name: Julie Burnett Procedure Date: 12/29/2023 9:00 AM MRN: [id number] Date of Birth: 03/10/1970 Attending MD: Toribio Fortune , , [id number] CSN: [id number] Age: 53 Admit Type: Outpatient Procedure:                Colonoscopy Indications:              Screening in patient at increased risk: Family                            history of 1st-degree relative with colorectal                            cancer before age 51 years Providers:                Toribio Fortune, Rosina Jackolyn Dorcas Alm,                            Technician Referring MD:              Medicines:                Monitored Anesthesia Care Complications:            No immediate complications. Estimated Blood Loss:     Estimated blood loss: none. Procedure:                Pre-Anesthesia Assessment:                           - Prior to the procedure, a History and Physical                            was performed, and patient medications, allergies                            and sensitivities were reviewed. The patient's                            tolerance of previous anesthesia was reviewed.                           - The risks and benefits of the procedure and the                            sedation options and risks were discussed with the                            patient. All questions were answered and informed                            consent was obtained.                           - ASA Grade Assessment: II - A patient with mild                            systemic disease.  After obtaining informed consent, the colonoscope                            was passed under direct vision. Throughout the                            procedure, the patient's blood pressure, pulse, and                            oxygen saturations were monitored continuously. The                            [id number] ([id number]) Peds Colon was introduced                             through the anus and advanced to the the cecum,                            identified by appendiceal orifice and ileocecal                            valve. The colonoscopy was performed without                            difficulty. The patient tolerated the procedure                            well. The quality of the bowel preparation was good. Scope In: 9:22:14 AM Scope Out: 9:44:03 AM Scope Withdrawal Time: 0 hours 17 minutes 59 seconds  Total Procedure Duration: 0 hours 21 minutes 49 seconds  Findings:      The perianal and digital rectal examinations were normal.      A 2 mm polyp was found in the sigmoid colon. The polyp was sessile. The       polyp was removed with a cold snare. Resection and retrieval were       complete.      A few medium-mouthed diverticula were found in the sigmoid colon.      The retroflexed view of the distal rectum and anal verge was normal and       showed no anal or rectal abnormalities. Impression:               - One 2 mm polyp in the sigmoid colon, removed with                            a cold snare. Resected and retrieved.                           - Diverticulosis in the sigmoid colon.                           - The distal rectum and anal verge are normal on                            retroflexion view. Moderate  Sedation:      Per Anesthesia Care Recommendation:           - Discharge patient to home (ambulatory).                           - Resume previous diet.                           - Await pathology results.                           - Repeat colonoscopy in 5 years. Procedure Code(s):        --- Professional ---                           [phone], Colonoscopy, flexible; with removal of                            tumor(s), polyp(s), or other lesion(s) by snare                            technique Diagnosis Code(s):        --- Professional ---                           Z80.0, Family history of malignant neoplasm of                             digestive organs                           D12.5, Benign neoplasm of sigmoid colon                           K57.30, Diverticulosis of large intestine without                            perforation or abscess without bleeding CPT copyright 2022 American Medical Association. All rights reserved. The codes documented in this report are preliminary and upon coder review may  be revised to meet current compliance requirements. Toribio Fortune, MD Toribio Fortune,  12/29/2023 9:53:42 AM This report has been signed electronically. Number of Addenda: 0

## 2023-12-29 NOTE — Anesthesia Preprocedure Evaluation (Signed)
 Anesthesia Evaluation  Patient identified by MRN, date of birth, ID band Patient awake    Reviewed: Allergy & Precautions, NPO status , Patient's Chart, lab work & pertinent test results  Airway Mallampati: II  TM Distance: >3 FB Neck ROM: Full    Dental  (+) Edentulous Upper,    Pulmonary Current SmokerPatient did not abstain from smoking.    + decreased breath sounds      Cardiovascular hypertension, Normal cardiovascular exam Rhythm:Regular Rate:Normal     Neuro/Psych   Anxiety Depression Bipolar Disorder   negative neurological ROS     GI/Hepatic negative GI ROS, Neg liver ROS,,,  Endo/Other  diabetes    Renal/GU negative Renal ROS  negative genitourinary   Musculoskeletal negative musculoskeletal ROS (+)    Abdominal Normal abdominal exam  (+)   Peds  Hematology negative hematology ROS (+)   Anesthesia Other Findings   Reproductive/Obstetrics negative OB ROS                              Anesthesia Physical Anesthesia Plan  ASA: 3  Anesthesia Plan: MAC   Post-op Pain Management:    Induction: Intravenous  PONV Risk Score and Plan: 1 and Propofol  infusion and TIVA  Airway Management Planned: Natural Airway and Nasal Cannula  Additional Equipment:   Intra-op Plan:   Post-operative Plan:   Informed Consent: I have reviewed the patients History and Physical, chart, labs and discussed the procedure including the risks, benefits and alternatives for the proposed anesthesia with the patient or authorized representative who has indicated his/her understanding and acceptance.       Plan Discussed with: Anesthesiologist and Surgeon  Anesthesia Plan Comments:         Anesthesia Quick Evaluation

## 2023-12-29 NOTE — Transfer of Care (Signed)
 Immediate Anesthesia Transfer of Care Note  Patient: Julie Burnett  Procedure(s) Performed: COLONOSCOPY POLYPECTOMY, INTESTINE  Patient Location: Endoscopy Unit  Anesthesia Type:General  Level of Consciousness: awake, alert , oriented, and patient cooperative  Airway & Oxygen Therapy: Patient Spontanous Breathing  Post-op Assessment: Report given to RN and Post -op Vital signs reviewed and stable  Post vital signs: Reviewed and stable  Last Vitals:  Vitals Value Taken Time  BP 108/70 12/29/23 09:48  Temp 36.6 C 12/29/23 09:48  Pulse 59 12/29/23 09:48  Resp 14 12/29/23 09:48  SpO2 100 % 12/29/23 09:48    Last Pain:  Vitals:   12/29/23 0948  TempSrc: Oral  PainSc: 0-No pain      Patients Stated Pain Goal: 3 (12/29/23 0836)  Complications: No notable events documented.

## 2023-12-29 NOTE — H&P (Signed)
 Julie Burnett is an 53 y.o. female.   Chief Complaint: Family history of colon cancer HPI: 53 year old female with past medical history of bipolar disorder, anxiety, hyperlipidemia, hypertension, coming for family history of colon cancer.  The patient has never had a colonoscopy in the past.  The patient denies having any complaints such as melena, hematochezia, abdominal pain or distention, change in her bowel movement consistency or frequency, no changes in weight recently.  Sister was diagnosed with colon cancer in her mid 3s.  Past Medical History:  Diagnosis Date   Alcohol  dependence (HCC)    Anxiety    Back pain    Bipolar 1 disorder (HCC)    Closed displaced fracture of middle phalanx of right ring finger with routine healing 09/24/2015   Closed displaced fracture of proximal phalanx of right index finger with routine healing 09/24/2015   Closed displaced fracture of proximal phalanx of right middle finger with routine healing 09/24/2015   Closed fracture of distal end of right radius with routine healing 05/22/2015   Closed fracture of shaft of right humerus 10/02/2020   Closed nondisplaced fracture of head of left radius with routine healing 05/22/2015   Depression    Diabetes mellitus    supposed to take metformin  but does not take   Diabetes mellitus without complication (HCC)    Drug-seeking behavior    H/O suicide attempt    High cholesterol    Hypertension    Liver disease    Pain in finger of right hand 10/01/2015   Polysubstance abuse (HCC)    Stiffness of finger joint of right hand 10/01/2015    Past Surgical History:  Procedure Laterality Date   BACK SURGERY     CLOSED REDUCTION FINGER WITH PERCUTANEOUS PINNING Right 09/17/2015   Procedure: Right Index and Long Finger CLOSED REDUCTION AND  PERCUTANEOUS PINNING OF FRACTURES;  Surgeon: Franky Curia, MD;  Location: [facility];  Service: Orthopedics;  Laterality: Right;  Right Index and Long Finger  CLOSED REDUCTION AND  PERCUTANEOUS PINNING OF FRACTURES   OPEN REDUCTION INTERNAL FIXATION (ORIF) DISTAL RADIAL FRACTURE Right 05/16/2015   Procedure: RIGHT OPEN REDUCTION INTERNAL FIXATION (ORIF) DISTAL RADIUS AND SCAPHOID ORIF;  Surgeon: Franky Curia, MD;  Location: [facility];  Service: Orthopedics;  Laterality: Right;   OPEN REDUCTION INTERNAL FIXATION (ORIF) METACARPAL Right 09/17/2015   Procedure: OPEN REDUCTION EXTERNAL FIXATION (ORIF) OF RIGHT RING FINGER;  Surgeon: Franky Curia, MD;  Location: [facility];  Service: Orthopedics;  Laterality: Right;  OPEN REDUCTION EXTERNAL FIXATION (ORIF) OF RIGHT RING FINGER   ORIF SCAPHOID FRACTURE Right 05/16/2015   Procedure: RIGHT OPEN REDUCTION INTERNAL FIXATION (ORIF) SCAPHOID FRACTURE;  Surgeon: Franky Curia, MD;  Location: [facility];  Service: Orthopedics;  Laterality: Right;   SHOULDER HEMI-ARTHROPLASTY Right 10/25/2020   Procedure: SHOULDER HEMI-ARTHROPLASTY;  Surgeon: Cristy Bonner DASEN, MD;  Location: [facility];  Service: Orthopedics;  Laterality: Right;    History reviewed. No pertinent family history. Social History:  reports that she has been smoking cigarettes. She has a 17 pack-year smoking history. She has never used smokeless tobacco. She reports that she does not currently use alcohol  after a past usage of about 24.0 standard drinks of alcohol  per week. She reports current drug use. Drugs: Marijuana, Crack cocaine, and Amphetamines.  Allergies: Allergies[1]  Medications Prior to Admission  Medication Sig Dispense Refill   gabapentin  (NEURONTIN ) 300 MG capsule Take 300 mg by mouth 3 (three) times daily.  metFORMIN  (GLUCOPHAGE ) 500 MG tablet Take 500 mg by mouth daily with breakfast.     escitalopram (LEXAPRO) 20 MG tablet Take 20 mg by mouth daily.      Results for orders placed or performed during the hospital encounter of 12/29/23 (from the past 48 hours)  Glucose, capillary     Status: Abnormal    Collection Time: 12/29/23  8:49 AM  Result Value Ref Range   Glucose-Capillary 115 (H) 70 - 99 mg/dL    Comment: Glucose reference range applies only to samples taken after fasting for at least 8 hours.   No results found.  Review of Systems  Blood pressure 108/70, pulse (!) 59, temperature 97.8 F (36.6 C), temperature source Oral, resp. rate 14, height 5' 5 (1.651 m), weight 68 kg, last menstrual period 09/22/2023, SpO2 100%. Physical Exam  GENERAL: The patient is AO x3, in no acute distress. HEENT: Head is normocephalic and atraumatic. EOMI are intact. Mouth is well hydrated and without lesions. NECK: Supple. No masses LUNGS: Clear to auscultation. No presence of rhonchi/wheezing/rales. Adequate chest expansion HEART: RRR, normal s1 and s2. ABDOMEN: Soft, nontender, no guarding, no peritoneal signs, and nondistended. BS +. No masses. EXTREMITIES: Without any cyanosis, clubbing, rash, lesions or edema. NEUROLOGIC: AOx3, no focal motor deficit. SKIN: no jaundice, no rashes  Assessment/Plan 53 year old female with past medical history of bipolar disorder, anxiety, hyperlipidemia, hypertension, coming for family history of colon cancer.  Will proceed with colonoscopy.  Toribio Eartha Flavors, MD 12/29/2023, 10:05 AM       [1]  Allergies Allergen Reactions   Naprosyn  [Naproxen ] Hives and Itching   Codeine Itching and Rash

## 2023-12-29 NOTE — Discharge Instructions (Signed)
 You are being discharged to home.  Resume your previous diet.  We are waiting for your pathology results.  Your physician has recommended a repeat colonoscopy in five years.

## 2023-12-30 ENCOUNTER — Encounter (INDEPENDENT_AMBULATORY_CARE_PROVIDER_SITE_OTHER): Payer: Self-pay | Admitting: *Deleted

## 2023-12-30 ENCOUNTER — Encounter (HOSPITAL_COMMUNITY): Payer: Self-pay | Admitting: Gastroenterology

## 2023-12-31 NOTE — Anesthesia Postprocedure Evaluation (Signed)
"   Anesthesia Post Note  Patient: Julie Burnett  Procedure(s) Performed: COLONOSCOPY POLYPECTOMY, INTESTINE  Patient location during evaluation: Phase II Anesthesia Type: MAC Level of consciousness: awake Pain management: pain level controlled Vital Signs Assessment: post-procedure vital signs reviewed and stable Respiratory status: spontaneous breathing and respiratory function stable Cardiovascular status: blood pressure returned to baseline and stable Postop Assessment: no headache and no apparent nausea or vomiting Anesthetic complications: no Comments: Late entry   No notable events documented.   Last Vitals:  Vitals:   12/29/23 0836 12/29/23 0948  BP: 130/74 108/70  Pulse: 68 (!) 59  Resp: 13 14  Temp: 36.7 C 36.6 C  SpO2: 98% 100%    Last Pain:  Vitals:   12/29/23 0948  TempSrc: Oral  PainSc: 0-No pain                 Yvonna PARAS Ustin Cruickshank      "

## 2024-01-07 ENCOUNTER — Ambulatory Visit (INDEPENDENT_AMBULATORY_CARE_PROVIDER_SITE_OTHER): Payer: Self-pay | Admitting: Gastroenterology

## 2024-01-07 LAB — SURGICAL PATHOLOGY

## 2024-01-12 NOTE — Progress Notes (Signed)
 5 yr TCS noted in recall Patient result letter mailed procedure note and pathology result faxed to PCP
